# Patient Record
Sex: Female | Born: 1985 | Race: White | Hispanic: No | Marital: Single | Smoking: Current every day smoker
Health system: Southern US, Community
[De-identification: ages and names within clinical notes are randomized; demographics above are authoritative.]

## PROBLEM LIST (undated history)

## (undated) DIAGNOSIS — F419 Anxiety disorder, unspecified: Secondary | ICD-10-CM

## (undated) DIAGNOSIS — M25519 Pain in unspecified shoulder: Secondary | ICD-10-CM

## (undated) DIAGNOSIS — F1910 Other psychoactive substance abuse, uncomplicated: Secondary | ICD-10-CM

## (undated) DIAGNOSIS — B1920 Unspecified viral hepatitis C without hepatic coma: Secondary | ICD-10-CM

## (undated) DIAGNOSIS — F1990 Other psychoactive substance use, unspecified, uncomplicated: Secondary | ICD-10-CM

## (undated) DIAGNOSIS — G8929 Other chronic pain: Secondary | ICD-10-CM

## (undated) DIAGNOSIS — R87629 Unspecified abnormal cytological findings in specimens from vagina: Secondary | ICD-10-CM

## (undated) HISTORY — DX: Unspecified viral hepatitis C without hepatic coma: B19.20

## (undated) HISTORY — PX: ANKLE SURGERY: SHX546

## (undated) NOTE — *Deleted (*Deleted)
Patient discharged via wheelchair accompanied by staff. Pt. Is alert and oriented, not on any distress. AVS discussed and pt. Was properly instructed, pt. Verbalized understanding. Discharge paper given to pt. All personal belongings are with the pt. 

---
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
## 2007-12-26 ENCOUNTER — Emergency Department (HOSPITAL_COMMUNITY): Admission: EM | Admit: 2007-12-26 | Discharge: 2007-12-26 | Payer: Self-pay | Admitting: Emergency Medicine

## 2007-12-26 ENCOUNTER — Encounter: Payer: Self-pay | Admitting: Orthopedic Surgery

## 2008-01-07 ENCOUNTER — Ambulatory Visit: Payer: Self-pay | Admitting: Orthopedic Surgery

## 2008-01-07 DIAGNOSIS — S93409A Sprain of unspecified ligament of unspecified ankle, initial encounter: Secondary | ICD-10-CM | POA: Insufficient documentation

## 2008-06-11 ENCOUNTER — Emergency Department (HOSPITAL_COMMUNITY): Admission: EM | Admit: 2008-06-11 | Discharge: 2008-06-11 | Payer: Self-pay | Admitting: Emergency Medicine

## 2008-09-24 ENCOUNTER — Emergency Department (HOSPITAL_COMMUNITY): Admission: EM | Admit: 2008-09-24 | Discharge: 2008-09-24 | Payer: Self-pay | Admitting: Emergency Medicine

## 2008-10-28 ENCOUNTER — Emergency Department (HOSPITAL_COMMUNITY): Admission: EM | Admit: 2008-10-28 | Discharge: 2008-10-28 | Payer: Self-pay | Admitting: Emergency Medicine

## 2009-04-30 ENCOUNTER — Emergency Department (HOSPITAL_COMMUNITY): Admission: EM | Admit: 2009-04-30 | Discharge: 2009-04-30 | Payer: Self-pay | Admitting: Emergency Medicine

## 2009-08-13 ENCOUNTER — Emergency Department (HOSPITAL_COMMUNITY): Admission: EM | Admit: 2009-08-13 | Discharge: 2009-08-13 | Payer: Self-pay | Admitting: Emergency Medicine

## 2009-10-16 ENCOUNTER — Emergency Department (HOSPITAL_COMMUNITY): Admission: EM | Admit: 2009-10-16 | Discharge: 2009-10-16 | Payer: Self-pay | Admitting: Emergency Medicine

## 2010-01-01 ENCOUNTER — Emergency Department (HOSPITAL_COMMUNITY): Admission: EM | Admit: 2010-01-01 | Discharge: 2010-01-01 | Payer: Self-pay | Admitting: Emergency Medicine

## 2010-03-09 ENCOUNTER — Emergency Department (HOSPITAL_COMMUNITY): Admission: EM | Admit: 2010-03-09 | Discharge: 2010-03-09 | Payer: Self-pay | Admitting: Emergency Medicine

## 2010-03-10 ENCOUNTER — Ambulatory Visit (HOSPITAL_COMMUNITY): Admission: RE | Admit: 2010-03-10 | Discharge: 2010-03-10 | Payer: Self-pay | Admitting: Emergency Medicine

## 2010-05-05 ENCOUNTER — Emergency Department (HOSPITAL_COMMUNITY): Admission: EM | Admit: 2010-05-05 | Discharge: 2010-05-05 | Payer: Self-pay | Admitting: Emergency Medicine

## 2010-05-07 ENCOUNTER — Emergency Department (HOSPITAL_COMMUNITY): Admission: EM | Admit: 2010-05-07 | Discharge: 2010-05-08 | Payer: Self-pay | Admitting: Emergency Medicine

## 2010-05-08 ENCOUNTER — Inpatient Hospital Stay (HOSPITAL_COMMUNITY): Admission: AD | Admit: 2010-05-08 | Discharge: 2010-05-11 | Payer: Self-pay | Admitting: Psychiatry

## 2010-05-08 ENCOUNTER — Ambulatory Visit: Payer: Self-pay | Admitting: Psychiatry

## 2010-05-30 ENCOUNTER — Emergency Department (HOSPITAL_COMMUNITY): Admission: EM | Admit: 2010-05-30 | Discharge: 2010-05-30 | Payer: Self-pay | Admitting: Emergency Medicine

## 2010-05-30 ENCOUNTER — Encounter: Payer: Self-pay | Admitting: Orthopedic Surgery

## 2010-06-14 ENCOUNTER — Emergency Department (HOSPITAL_COMMUNITY): Admission: EM | Admit: 2010-06-14 | Discharge: 2010-06-14 | Payer: Self-pay | Admitting: Emergency Medicine

## 2010-06-14 ENCOUNTER — Encounter: Payer: Self-pay | Admitting: Orthopedic Surgery

## 2010-06-20 ENCOUNTER — Ambulatory Visit: Payer: Self-pay | Admitting: Orthopedic Surgery

## 2010-06-20 DIAGNOSIS — S8253XA Displaced fracture of medial malleolus of unspecified tibia, initial encounter for closed fracture: Secondary | ICD-10-CM

## 2010-06-25 ENCOUNTER — Encounter (INDEPENDENT_AMBULATORY_CARE_PROVIDER_SITE_OTHER): Payer: Self-pay | Admitting: *Deleted

## 2010-06-27 ENCOUNTER — Encounter: Payer: Self-pay | Admitting: Orthopedic Surgery

## 2010-06-29 ENCOUNTER — Ambulatory Visit (HOSPITAL_COMMUNITY): Admission: RE | Admit: 2010-06-29 | Discharge: 2010-06-29 | Payer: Self-pay | Admitting: Orthopedic Surgery

## 2010-06-29 ENCOUNTER — Ambulatory Visit: Payer: Self-pay | Admitting: Orthopedic Surgery

## 2010-07-03 ENCOUNTER — Telehealth: Payer: Self-pay | Admitting: Orthopedic Surgery

## 2010-07-05 ENCOUNTER — Ambulatory Visit: Payer: Self-pay | Admitting: Orthopedic Surgery

## 2010-07-11 ENCOUNTER — Ambulatory Visit: Payer: Self-pay | Admitting: Orthopedic Surgery

## 2010-08-02 ENCOUNTER — Ambulatory Visit: Payer: Self-pay | Admitting: Orthopedic Surgery

## 2010-08-30 ENCOUNTER — Ambulatory Visit: Payer: Self-pay | Admitting: Orthopedic Surgery

## 2011-01-15 NOTE — Assessment & Plan Note (Signed)
Summary: POST OP 1/LT ANKLE FX SURG 06/29/10/[employer]/CAF   Visit Type:  Follow-up Referring Provider:  ER  CC:  post op 1 left ankle.  History of Present Illness: I saw Heidi [name] in the office today for a followup visit.  She is a 25 years old woman with the complaint of:  Postop day #6 minutes to first postop appointment DOS 06/29/10.  POD 6.  Today for dressing change and air cast or cam walker.  Meds: Ibuprofen 800, Norco 5, and Phenergan.  No problems, on crutches today.  Exam shows that the foot as a tibialis anterior spasm bring the foot into significant supination.  Passively I cannot get it straight.  The wound looks fine  I placed her in a short leg cast to try to break the spasm.    Allergies: 1)  ! Effexor 2)  ! Pcn   Impression & Recommendations:  Problem # 1:  AFTERCARE FOLLOW SURGERY MUSCULOSKEL SYSTEM NEC (ICD-V58.78)  Orders: Post-Op Check (16109) Short Leg Cast (60454)  Problem # 2:  CLOSED FRACTURE OF MEDIAL MALLEOLUS (ICD-824.0)  Orders: Post-Op Check (09811) Short Leg Cast (91478)  Patient Instructions: 1)  F/u wednesday  2)  cast  3)  off staples out possible lidocaine injection
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
## 2011-01-15 NOTE — Assessment & Plan Note (Signed)
Summary: post op 2/lt anklebcbs/bsf   Visit Type:  Follow-up Referring Provider:  ER  CC:  post op 2 ankle.  History of Present Illness: I saw Heidi [name] in the office today for a followup visit.  She is a 25 years old woman with the complaint of:  Postop day #6 minutes to first postop appointment  DOS 06/29/10.  POD 12.  Meds: Ibuprofen 800, Norco 5, and Phenergan.  Today is recheck cast off, suture removal, possible injection.  Doing well  The patient lost her medications. She [place] the [place]. She lost her purse. She got her refill early, so we can give her a new one.  Her foot motion is better. Her spasm has improved. She is placed in an Aircast.  X-ray next visit   Allergies: 1)  ! Effexor 2)  ! Pcn   Impression & Recommendations:  Problem # 1:  AFTERCARE FOLLOW SURGERY MUSCULOSKEL SYSTEM NEC (ICD-V58.78) Assessment Improved  Orders: Post-Op Check (81191)  Problem # 2:  CLOSED FRACTURE OF MEDIAL MALLEOLUS (ICD-824.0) Assessment: Improved  Orders: Post-Op Check (47829)  Medications Added to Medication List This Visit: 1)  Ibuprofen 800 Mg Tabs (Ibuprofen) .Marland Kitchen.. 1 q 6 as needed pain  Patient Instructions: 1)  exercise the foot and ankle daily  2)  wear air cast fro ambulation  3)  xrays in 2 weeks  Prescriptions: IBUPROFEN 800 MG TABS (IBUPROFEN) 1 q 6 as needed pain  #90 x 1   Entered and Authorized by:   Fuller Canada MD   Signed by:   Fuller Canada MD on 07/11/2010   Method used:   Print then Give to Patient   RxID:   [id number]

## 2011-01-15 NOTE — Letter (Signed)
Summary: surgery order LT ankle Fx sched 06/29/10  surgery order LT ankle Fx sched 06/29/10   Imported By: Cammie Sickle 06/22/2010 10:54:26  _____________________________________________________________________  External Attachment:    Type:   Image     Comment:   External Document

## 2011-01-15 NOTE — Assessment & Plan Note (Signed)
Summary: 4 xr ankle, brace x 4 weeks/bcbs/bsf   Visit Type:  Follow-up Referring Sanav Remer:  ER  CC:  post op ankle recheck.  History of Present Illness:  DOS 06/29/10.  Meds: Ibuprofen 800, Norco 5 filled this week.  Her ankle motion is improved her foot alignment is returned to normal the spasm in the tibialis anterior tendon has resolved her incision looks good  Today is 4 week recheck after bracing with xrays today.  Doing well, no limp, not in brace.  The patient says she's gotten her life together she has her own apartment and is about to get a job we are very proud of her  Her incision looks great she's and bleeding with no limp  Radiographs AP lateral and oblique of the ankle and question LEFT ankle shows that the fracture is healed the hardware is in good position impression healed medial malleolus fracture with internal fixation 2 screws  Patient is discharged with healed fracture    Allergies: 1)  ! Effexor 2)  ! Pcn   Other Orders: Post-Op Check (16109) Ankle x-ray complete,  minimum 3 views (60454)  Patient Instructions: 1)  Please schedule a follow-up appointment as needed.

## 2011-01-15 NOTE — Letter (Signed)
Summary: History form  History form   Imported By: Jacklynn Ganong 06/25/2010 13:31:23  _____________________________________________________________________  External Attachment:    Type:   Image     Comment:   External Document

## 2011-01-15 NOTE — Progress Notes (Signed)
Summary: patient re-scheduled post op appointment  Phone Note Call from Patient   Caller: Patient Summary of Call: Patient called to re-schedule her post op #1 appointment for this afternoon, due to no transportation. Re-scheduled for Thursday, 07/05/10.  Gave patient option of Wednesday also; patient prefersThursday as certain of ride then. Initial call taken by: Cammie Sickle,  July 03, 2010 3:16 PM

## 2011-01-15 NOTE — Assessment & Plan Note (Signed)
Summary: RE-CHECK/XRAY/POST OP 06/29/10/[employer]/CAF   Visit Type:  Follow-up Referring Provider:  ER  CC:  post op left ankle.  History of Present Illness:  DOS 06/29/10.  Meds: Ibuprofen 800, Norco 7.5 q 4 hrs.  The patient continues to improve she is here today for x-rays and prescription refill  Her ankle motion is improved her foot alignment is returned to normal the spasm in the tibialis anterior tendon has resolved her incision looks good  X-rays were obtained of the LEFT ankle 3 views fracture appears to be healing well with hardware intact mortise stable  Impression healing LEFT ankle fracture medial malleolus fixation  plan  brace x4 weeks x-rays 4 weeks  Allergies: 1)  ! Effexor 2)  ! Pcn   Impression & Recommendations:  Problem # 1:  AFTERCARE FOLLOW SURGERY MUSCULOSKEL SYSTEM NEC (ICD-V58.78) Assessment Improved  Orders: Post-Op Check (16109)  Problem # 2:  CLOSED FRACTURE OF MEDIAL MALLEOLUS (ICD-824.0) Assessment: Improved  Orders: Post-Op Check (60454) Ankle x-ray complete,  minimum 3 views (09811)  Medications Added to Medication List This Visit: 1)  Norco 7.5-325 Mg Tabs (Hydrocodone-acetaminophen) .Marland Kitchen.. 1 q 4 as needed pain  Patient Instructions: 1)  4 weeks xrays ankle  2)  brace x 4 weeks  Prescriptions: NORCO 7.5-325 MG TABS (HYDROCODONE-ACETAMINOPHEN) 1 q 4 as needed pain  #84 x 1   Entered and Authorized by:   Fuller Canada MD   Signed by:   Fuller Canada MD on 08/02/2010   Method used:   Print then Give to Patient   RxID:   [id number]
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
## 2011-01-15 NOTE — Medication Information (Signed)
Summary: RX Medication discharge instructions [facility]  RX Medication discharge instructions [facility]   Imported By: Cammie Sickle 07/03/2010 13:46:49  _____________________________________________________________________  External Attachment:    Type:   Image     Comment:   External Document

## 2011-01-15 NOTE — Miscellaneous (Signed)
Summary: No pre-auth required for out-patient surgery  Clinical Lists Changes   Contacted [employer] insurance re: pre-authorization for out-patient surgery scheduled 06/29/10. Per Deon, if in network, no pre-cert required. CPT [phone] range do not require.

## 2011-03-03 LAB — CBC
HCT: 39.7 % (ref 36.0–46.0)
Hemoglobin: 13.2 g/dL (ref 12.0–15.0)
MCH: 28.2 pg (ref 26.0–34.0)
MCHC: 33.3 g/dL (ref 30.0–36.0)
MCV: 84.6 fL (ref 78.0–100.0)
Platelets: 360 10*3/uL (ref 150–400)
RBC: 4.69 MIL/uL (ref 3.87–5.11)
RDW: 13.7 % (ref 11.5–15.5)
WBC: 12 10*3/uL — ABNORMAL HIGH (ref 4.0–10.5)

## 2011-03-03 LAB — URINE MICROSCOPIC-ADD ON

## 2011-03-03 LAB — URINALYSIS, ROUTINE W REFLEX MICROSCOPIC
Glucose, UA: NEGATIVE mg/dL
Ketones, ur: NEGATIVE mg/dL
Ketones, ur: NEGATIVE mg/dL
Leukocytes, UA: NEGATIVE
Nitrite: NEGATIVE
Specific Gravity, Urine: 1.03 (ref 1.005–1.030)
pH: 6 (ref 5.0–8.0)

## 2011-03-03 LAB — DIFFERENTIAL
Eosinophils Relative: 1 % (ref 0–5)
Lymphocytes Relative: 20 % (ref 12–46)
Lymphs Abs: 2.4 10*3/uL (ref 0.7–4.0)
Monocytes Absolute: 0.5 10*3/uL (ref 0.1–1.0)
Monocytes Relative: 5 % (ref 3–12)
Neutro Abs: 8.9 10*3/uL — ABNORMAL HIGH (ref 1.7–7.7)

## 2011-03-03 LAB — RAPID URINE DRUG SCREEN, HOSP PERFORMED
Amphetamines: NOT DETECTED
Barbiturates: NOT DETECTED
Cocaine: NOT DETECTED
Opiates: POSITIVE — AB
Tetrahydrocannabinol: POSITIVE — AB
Tetrahydrocannabinol: POSITIVE — AB

## 2011-03-03 LAB — PREGNANCY, URINE: Preg Test, Ur: NEGATIVE

## 2011-03-03 LAB — BASIC METABOLIC PANEL
Calcium: 9.4 mg/dL (ref 8.4–10.5)
Sodium: 138 mEq/L (ref 135–145)

## 2011-03-03 LAB — ETHANOL: Alcohol, Ethyl (B): <5 mg/dL (ref 0–10)

## 2011-03-04 LAB — BASIC METABOLIC PANEL
CO2: 25 mEq/L (ref 19–32)
Calcium: 8.6 mg/dL (ref 8.4–10.5)
Chloride: 105 mEq/L (ref 96–112)
Creatinine, Ser: 0.72 mg/dL (ref 0.4–1.2)
GFR calc Af Amer: >60 mL/min (ref >60)
Glucose, Bld: 121 mg/dL — ABNORMAL HIGH (ref 70–99)

## 2011-03-04 LAB — URINALYSIS, ROUTINE W REFLEX MICROSCOPIC
Glucose, UA: NEGATIVE mg/dL
Hgb urine dipstick: NEGATIVE
Protein, ur: NEGATIVE mg/dL
Specific Gravity, Urine: >1.03 — ABNORMAL HIGH (ref 1.005–1.030)
pH: 5 (ref 5.0–8.0)

## 2011-03-04 LAB — RAPID URINE DRUG SCREEN, HOSP PERFORMED
Amphetamines: NOT DETECTED
Barbiturates: NOT DETECTED
Benzodiazepines: POSITIVE — AB
Cocaine: NOT DETECTED
Opiates: POSITIVE — AB

## 2011-03-04 LAB — DIFFERENTIAL
Basophils Absolute: 0 10*3/uL (ref 0.0–0.1)
Basophils Relative: 1 % (ref 0–1)
Eosinophils Absolute: 0.1 10*3/uL (ref 0.0–0.7)
Monocytes Absolute: 0.4 10*3/uL (ref 0.1–1.0)
Monocytes Relative: 6 % (ref 3–12)
Neutrophils Relative %: 57 % (ref 43–77)

## 2011-03-04 LAB — CBC
Hemoglobin: 11.3 g/dL — ABNORMAL LOW (ref 12.0–15.0)
MCHC: 34.8 g/dL (ref 30.0–36.0)
MCV: 82 fL (ref 78.0–100.0)
RBC: 3.97 MIL/uL (ref 3.87–5.11)
RDW: 13.4 % (ref 11.5–15.5)

## 2011-03-10 LAB — WET PREP, GENITAL: Trich, Wet Prep: NONE SEEN

## 2011-03-10 LAB — URINALYSIS, ROUTINE W REFLEX MICROSCOPIC
Glucose, UA: NEGATIVE mg/dL
Hgb urine dipstick: NEGATIVE
Ketones, ur: NEGATIVE mg/dL
Protein, ur: NEGATIVE mg/dL
Urobilinogen, UA: 0.2 mg/dL (ref 0.0–1.0)

## 2011-03-20 LAB — URINALYSIS, ROUTINE W REFLEX MICROSCOPIC
Leukocytes, UA: NEGATIVE
Nitrite: POSITIVE — AB
Protein, ur: NEGATIVE mg/dL
Urobilinogen, UA: 1 mg/dL (ref 0.0–1.0)

## 2011-03-20 LAB — URINE CULTURE

## 2011-03-20 LAB — PREGNANCY, URINE: Preg Test, Ur: NEGATIVE

## 2011-03-20 LAB — URINE MICROSCOPIC-ADD ON

## 2011-03-26 LAB — URINE CULTURE

## 2011-03-26 LAB — URINALYSIS, ROUTINE W REFLEX MICROSCOPIC
Glucose, UA: NEGATIVE mg/dL
Ketones, ur: NEGATIVE mg/dL
Leukocytes, UA: NEGATIVE
Protein, ur: NEGATIVE mg/dL

## 2011-03-26 LAB — PREGNANCY, URINE: Preg Test, Ur: NEGATIVE

## 2011-03-26 LAB — RAPID URINE DRUG SCREEN, HOSP PERFORMED
Amphetamines: NOT DETECTED
Barbiturates: NOT DETECTED
Benzodiazepines: POSITIVE — AB

## 2011-09-17 LAB — URINALYSIS, ROUTINE W REFLEX MICROSCOPIC
Leukocytes, UA: NEGATIVE
Nitrite: NEGATIVE
Specific Gravity, Urine: <1.005 — ABNORMAL LOW
pH: 6

## 2011-09-17 LAB — URINE MICROSCOPIC-ADD ON

## 2011-09-17 LAB — PREGNANCY, URINE: Preg Test, Ur: NEGATIVE

## 2011-10-09 ENCOUNTER — Emergency Department (HOSPITAL_COMMUNITY)
Admission: EM | Admit: 2011-10-09 | Discharge: 2011-10-09 | Disposition: A | Discharge status: Home / Self Care | Payer: Medicaid Other | Attending: Emergency Medicine | Admitting: Emergency Medicine

## 2011-10-09 ENCOUNTER — Encounter: Payer: Self-pay | Admitting: Emergency Medicine

## 2011-10-09 DIAGNOSIS — F17200 Nicotine dependence, unspecified, uncomplicated: Secondary | ICD-10-CM | POA: Insufficient documentation

## 2011-10-09 DIAGNOSIS — M25511 Pain in right shoulder: Secondary | ICD-10-CM

## 2011-10-09 DIAGNOSIS — M25519 Pain in unspecified shoulder: Secondary | ICD-10-CM | POA: Insufficient documentation

## 2011-10-09 HISTORY — DX: Pain in unspecified shoulder: M25.519

## 2011-10-09 HISTORY — DX: Other chronic pain: G89.29

## 2011-10-09 MED ORDER — HYDROCODONE-ACETAMINOPHEN 5-325 MG PO TABS: 1.0000 | ORAL_TABLET | Freq: Four times a day (QID) | ORAL | Status: AC | PRN

## 2011-10-09 MED ORDER — HYDROCODONE-ACETAMINOPHEN 5-325 MG PO TABS
1.0000 | ORAL_TABLET | Freq: Once | ORAL | Status: AC
  Administered 2011-10-09: 1 via ORAL
  Filled 2011-10-09: qty 1

## 2011-10-09 NOTE — ED Provider Notes (Signed)
Medical screening examination/treatment/procedure(s) were performed by non-physician practitioner and as supervising physician I was immediately available for consultation/collaboration.  Fitzroy Mikami R. Gerianne Simonet, MD 10/09/11 2359 

## 2011-10-09 NOTE — ED Provider Notes (Signed)
History     CSN: [id number] Arrival date & time: 10/09/2011  1:24 PM   First MD Initiated Contact with Patient 10/09/11 1520      Chief Complaint  Patient presents with  . Shoulder Pain    (Consider location/radiation/quality/duration/timing/severity/associated sxs/prior treatment) HPI Comments: States she injured her R shoulder while going through labor 6 weeks ago.  Recently seen at [facility] and told the x-rays were normal.  She wants pain medicine and an orthopedic referral.  Patient is a 25 y.o. female presenting with shoulder pain. The history is provided by the patient. No language interpreter was used.  Shoulder Pain This is a new problem. Episode onset: 6 weeks ago. The problem occurs constantly. The problem has been unchanged. Associated symptoms include arthralgias. Exacerbated by: movement. Treatments tried: soma and tramadol. The treatment provided no relief.    Past Medical History  Diagnosis Date  . Chronic shoulder pain     Past Surgical History  Procedure Date  . Ankle surgery     No family history on file.  History  Substance Use Topics  . Smoking status: Current Everyday Smoker  . Smokeless tobacco: Not on file  . Alcohol Use: No    OB History    Grav Para Term Preterm Abortions TAB SAB Ect Mult Living                  Review of Systems  Musculoskeletal: Positive for arthralgias.  All other systems reviewed and are negative.    Allergies  Penicillins; Venlafaxine; and Morphine and related  Home Medications   Current Outpatient Rx  Name Route Sig Dispense Refill  . IRON 240 (27 FE) MG PO TABS Oral Take 1 tablet by mouth daily. OTC     . IBUPROFEN 200 MG PO TABS Oral Take 800 mg by mouth every 6 (six) hours as needed. FOR PAIN OTC       BP 114/66  Pulse 85  Temp(Src) 98.6 F (37 C) (Oral)  Resp 20  Ht 5\' 7"  (1.702 m)  Wt 145 lb (65.772 kg)  BMI 22.71 kg/m2  SpO2 100%  LMP 10/02/2011  Physical Exam  Nursing note and  vitals reviewed. Constitutional: She is oriented to person, place, and time. She appears well-developed and well-nourished. No distress.  HENT:  Head: Normocephalic and atraumatic.  Eyes: EOM are normal.  Neck: Normal range of motion.  Cardiovascular: Normal rate, regular rhythm and normal heart sounds.   Pulmonary/Chest: Effort normal and breath sounds normal.  Abdominal: Soft. She exhibits no distension. There is no tenderness.  Musculoskeletal: She exhibits tenderness.       Right shoulder: She exhibits decreased range of motion and tenderness. She exhibits no bony tenderness, no swelling, no effusion and no crepitus.       Arms: Neurological: She is alert and oriented to person, place, and time.  Skin: Skin is warm and dry.  Psychiatric: She has a normal mood and affect. Judgment normal.    ED Course  Procedures (including critical care time)  Labs Reviewed - No data to display No results found.   No diagnosis found.    MDM          Worthy Rancher, PA 10/09/11 1534

## 2011-10-09 NOTE — ED Notes (Signed)
Pt has chronic r shoulder pain from high school. Moved furniture x 1 week ago and has been having pain since. ROM slightly limited due to pain. Nad. Ibuprofen with no relief. Seen at [facility] ER the other night and given muscle relaxer which pt states does not like to have due to newborn baby at home.

## 2012-03-31 ENCOUNTER — Encounter (HOSPITAL_COMMUNITY): Payer: Self-pay

## 2012-03-31 ENCOUNTER — Emergency Department (HOSPITAL_COMMUNITY)
Admission: EM | Admit: 2012-03-31 | Discharge: 2012-03-31 | Disposition: A | Discharge status: Home / Self Care | Payer: Self-pay | Attending: Emergency Medicine | Admitting: Emergency Medicine

## 2012-03-31 DIAGNOSIS — M25511 Pain in right shoulder: Secondary | ICD-10-CM

## 2012-03-31 DIAGNOSIS — F17200 Nicotine dependence, unspecified, uncomplicated: Secondary | ICD-10-CM | POA: Insufficient documentation

## 2012-03-31 DIAGNOSIS — G8929 Other chronic pain: Secondary | ICD-10-CM | POA: Insufficient documentation

## 2012-03-31 DIAGNOSIS — M25519 Pain in unspecified shoulder: Secondary | ICD-10-CM | POA: Insufficient documentation

## 2012-03-31 MED ORDER — PREDNISONE 10 MG PO TABS: ORAL_TABLET | ORAL | Status: DC

## 2012-03-31 MED ORDER — TRAMADOL HCL 50 MG PO TABS: 50.0000 mg | ORAL_TABLET | Freq: Four times a day (QID) | ORAL | Status: AC | PRN

## 2012-03-31 NOTE — Discharge Instructions (Signed)
Arthralgia Your caregiver has diagnosed you as suffering from an arthralgia. Arthralgia means there is pain in a joint. This can come from many reasons including:  Bruising the joint which causes soreness (inflammation) in the joint.   Wear and tear on the joints which occur as we grow older (osteoarthritis).   Overusing the joint.   Various forms of arthritis.   Infections of the joint.  Regardless of the cause of pain in your joint, most of these different pains respond to anti-inflammatory drugs and rest. The exception to this is when a joint is infected, and these cases are treated with antibiotics, if it is a bacterial infection. HOME CARE INSTRUCTIONS   Rest the injured area for as long as directed by your caregiver. Then slowly start using the joint as directed by your caregiver and as the pain allows. Crutches as directed may be useful if the ankles, knees or hips are involved. If the knee was splinted or casted, continue use and care as directed. If an stretchy or elastic wrapping bandage has been applied today, it should be removed and re-applied every 3 to 4 hours. It should not be applied tightly, but firmly enough to keep swelling down. Watch toes and feet for swelling, bluish discoloration, coldness, numbness or excessive pain. If any of these problems (symptoms) occur, remove the ace bandage and re-apply more loosely. If these symptoms persist, contact your caregiver or return to this location.   For the first 24 hours, keep the injured extremity elevated on pillows while lying down.   Apply ice for 15 to 20 minutes to the sore joint every couple hours while awake for the first half day. Then 3 to 4 times per day for the first 48 hours. Put the ice in a plastic bag and place a towel between the bag of ice and your skin.   Wear any splinting, casting, elastic bandage applications, or slings as instructed.   Only take over-the-counter or prescription medicines for pain,  discomfort, or fever as directed by your caregiver. Do not use aspirin immediately after the injury unless instructed by your physician. Aspirin can cause increased bleeding and bruising of the tissues.   If you were given crutches, continue to use them as instructed and do not resume weight bearing on the sore joint until instructed.  Persistent pain and inability to use the sore joint as directed for more than 2 to 3 days are warning signs indicating that you should see a caregiver for a follow-up visit as soon as possible. Initially, a hairline fracture (break in bone) may not be evident on X-rays. Persistent pain and swelling indicate that further evaluation, non-weight bearing or use of the joint (use of crutches or slings as instructed), or further X-rays are indicated. X-rays may sometimes not show a small fracture until a week or 10 days later. Make a follow-up appointment with your own caregiver or one to whom we have referred you. A radiologist (specialist in reading X-rays) may read your X-rays. Make sure you know how you are to obtain your X-ray results. Do not assume everything is normal if you do not hear from us. SEEK MEDICAL CARE IF: Bruising, swelling, or pain increases. SEEK IMMEDIATE MEDICAL CARE IF:   Your fingers or toes are numb or blue.   The pain is not responding to medications and continues to stay the same or get worse.   The pain in your joint becomes severe.   You develop a fever over   102 F (38.9 C).   It becomes impossible to move or use the joint.  MAKE SURE YOU:   Understand these instructions.   Will watch your condition.   Will get help right away if you are not doing well or get worse.  Document Released: 12/02/2005 Document Revised: 11/21/2011 Document Reviewed: 07/20/2008 [employer] Patient Information 2012 [employer]. 

## 2012-03-31 NOTE — ED Notes (Signed)
C/o rt shoulder pain x 6 months. Seen 2 months ago in ED and referred to Dr. Hilda Lias who refused to see her per pt. Pt states, " shoulder has not gotten any better"

## 2012-03-31 NOTE — ED Notes (Signed)
Pain rt shoulder for 6-7 mos. Seen here in 01/2012 and referred to Dr Hilda Lias.  Wants referral to another doctor and med for pain. Does not know of any injury but has hurt since child birth.

## 2012-03-31 NOTE — ED Provider Notes (Signed)
History     CSN: [id number]  Arrival date & time 03/31/12  1034   First MD Initiated Contact with Patient 03/31/12 1145      Chief Complaint  Patient presents with  . Shoulder Pain    (Consider location/radiation/quality/duration/timing/severity/associated sxs/prior treatment) HPI Comments: Heidi [name] presents with chronic right shoulder pain which she states she injured while delivering her now 57-month-old infant.  She was seen for this injury here last October and was referred to Dr. Hilda Lias, but she has been unable to see him.  She has also since been seen at an outside emergency department where she said x-rays were normal, but was told she probably had a rotator cuff injury.  She denies any new injury, but is requesting pain medication and a new referral to orthopedics.  She has seen Dr. Romeo Apple in the past when she fractured her ankle 2 years ago, but has not thought about calling him about her shoulder.  She has taken soma and ibuprofen in the past which has not relieved her symptoms.   Patient is a 26 y.o. female presenting with shoulder pain. The history is provided by the patient.  Shoulder Pain This is a chronic problem. The current episode started more than 1 month ago. The problem occurs constantly. The problem has been unchanged. Associated symptoms include arthralgias. Pertinent negatives include no abdominal pain, chest pain, congestion, coughing, fever, headaches, joint swelling, nausea, neck pain, numbness, rash, sore throat or weakness. Exacerbated by: Range of motion, particularly elevation above shoulder level exquisitely increases pain. She has tried NSAIDs for the symptoms. The treatment provided no relief.    Past Medical History  Diagnosis Date  . Chronic shoulder pain     Past Surgical History  Procedure Date  . Ankle surgery     No family history on file.  History  Substance Use Topics  . Smoking status: Current Everyday Smoker  . Smokeless tobacco:  Not on file  . Alcohol Use: No    OB History    Grav Para Term Preterm Abortions TAB SAB Ect Mult Living                  Review of Systems  Constitutional: Negative for fever.  HENT: Negative for congestion, sore throat and neck pain.   Eyes: Negative.   Respiratory: Negative for cough, chest tightness and shortness of breath.   Cardiovascular: Negative for chest pain.  Gastrointestinal: Negative for nausea and abdominal pain.  Genitourinary: Negative.   Musculoskeletal: Positive for arthralgias. Negative for joint swelling.  Skin: Negative.  Negative for rash and wound.  Neurological: Negative for dizziness, weakness, light-headedness, numbness and headaches.  Hematological: Negative.   Psychiatric/Behavioral: Negative.     Allergies  Penicillins; Venlafaxine; and Morphine and related  Home Medications   Current Outpatient Rx  Name Route Sig Dispense Refill  . IRON 240 (27 FE) MG PO TABS Oral Take 1 tablet by mouth daily. OTC     . IBUPROFEN 200 MG PO TABS Oral Take 800 mg by mouth every 6 (six) hours as needed. FOR PAIN OTC     . PREDNISONE 10 MG PO TABS  6, 5, 4, 3, 2 then 1 tablet by mouth daily for 6 days total. 21 tablet 0  . TRAMADOL HCL 50 MG PO TABS Oral Take 1 tablet (50 mg total) by mouth every 6 (six) hours as needed for pain. 15 tablet 0    BP 117/66  Pulse 71  Temp(Src) 98.1 F (  36.7 C) (Oral)  Resp 16  Ht 5\' 7"  (1.702 m)  Wt 136 lb (61.689 kg)  BMI 21.30 kg/m2  SpO2 100%  LMP 03/02/2012  Physical Exam  Nursing note and vitals reviewed. Constitutional: She is oriented to person, place, and time. She appears well-developed and well-nourished.  HENT:  Head: Normocephalic and atraumatic.  Eyes: Conjunctivae are normal.  Neck: Normal range of motion.  Cardiovascular: Normal rate, regular rhythm, normal heart sounds and intact distal pulses.  Exam reveals no decreased pulses.   Pulses:      Radial pulses are 2+ on the right side.  Pulmonary/Chest:  Effort normal and breath sounds normal. She has no wheezes.  Abdominal: Soft. Bowel sounds are normal. There is no tenderness.  Musculoskeletal: Normal range of motion. She exhibits tenderness. She exhibits no edema.       Right shoulder: She exhibits bony tenderness and pain. She exhibits no swelling, no effusion, no crepitus, no deformity, no spasm, normal pulse and normal strength.       Arms: Neurological: She is alert and oriented to person, place, and time. She has normal strength. No sensory deficit.       Equal grip strength  Skin: Skin is warm, dry and intact.  Psychiatric: She has a normal mood and affect.    ED Course  Procedures (including critical care time)  Labs Reviewed - No data to display No results found.   1. Chronic right shoulder pain       MDM  Patient presents with chronic pain, no acute injury and no significant findings on exam including erythema or effusion, crepitus of shoulder joint.  No signs of infectious source of pain.  She has equal grip strength bilaterally and no decreased sensation to fine touch or numbness distally to suggest neuropathy or C-spine injury.  Patient was prescribed a six-day prednisone taper and tramadol encouraged to call Dr. Romeo Apple for further management of her pain.        Candis Musa, PA 03/31/12 1824

## 2012-04-01 NOTE — ED Provider Notes (Signed)
Medical screening examination/treatment/procedure(s) were performed by non-physician practitioner and as supervising physician I was immediately available for consultation/collaboration.   Seini Lannom L Bao Bazen, MD 04/01/12 0715 

## 2012-12-31 ENCOUNTER — Emergency Department (HOSPITAL_COMMUNITY)
Admission: EM | Admit: 2012-12-31 | Discharge: 2012-12-31 | Disposition: A | Discharge status: Home / Self Care | Payer: Medicaid Other | Attending: Emergency Medicine | Admitting: Emergency Medicine

## 2012-12-31 ENCOUNTER — Encounter (HOSPITAL_COMMUNITY): Payer: Self-pay | Admitting: *Deleted

## 2012-12-31 DIAGNOSIS — F17200 Nicotine dependence, unspecified, uncomplicated: Secondary | ICD-10-CM | POA: Insufficient documentation

## 2012-12-31 DIAGNOSIS — M545 Low back pain, unspecified: Secondary | ICD-10-CM | POA: Insufficient documentation

## 2012-12-31 DIAGNOSIS — M25519 Pain in unspecified shoulder: Secondary | ICD-10-CM | POA: Insufficient documentation

## 2012-12-31 DIAGNOSIS — G8929 Other chronic pain: Secondary | ICD-10-CM | POA: Insufficient documentation

## 2012-12-31 DIAGNOSIS — M549 Dorsalgia, unspecified: Secondary | ICD-10-CM

## 2012-12-31 MED ORDER — HYDROCODONE-ACETAMINOPHEN 5-325 MG PO TABS
2.0000 | ORAL_TABLET | Freq: Once | ORAL | Status: AC
  Administered 2012-12-31: 2 via ORAL
  Filled 2012-12-31: qty 2

## 2012-12-31 MED ORDER — HYDROCODONE-ACETAMINOPHEN 5-325 MG PO TABS: 2.0000 | ORAL_TABLET | ORAL | Status: DC | PRN

## 2012-12-31 NOTE — ED Notes (Signed)
Pt c/o lower left back pain since she gave birth in December.  Denies any N/V/D.  Denies any pain with urination.

## 2012-12-31 NOTE — ED Provider Notes (Signed)
History   Scribed for Geoffery Lyons, MD, the patient was seen in room [facility]18/[facility]18 . This chart was scribed by Lewanda Rife.   CSN: [id number]  Arrival date & time 12/31/12  1953   First MD Initiated Contact with Patient 12/31/12 2216      Chief Complaint  Patient presents with  . Back Pain    (Consider location/radiation/quality/duration/timing/severity/associated sxs/prior treatment) HPI Heidi [name] is a 27 y.o. female who presents to the Emergency Department complaining of waxing and waning moderate left lower back pain since after giving birth 3 weeks ago. Pt denies having any urinary symptoms, constipation, and diarrhea. Pt denies taking anything for pain to treat symptoms. Pt reports hx of back pain in the past.   Past Medical History  Diagnosis Date  . Chronic shoulder pain     Past Surgical History  Procedure Date  . Ankle surgery     History reviewed. No pertinent family history.  History  Substance Use Topics  . Smoking status: Current Every Day Smoker  . Smokeless tobacco: Not on file  . Alcohol Use: No    OB History    Grav Para Term Preterm Abortions TAB SAB Ect Mult Living                  Review of Systems  Constitutional: Negative.  Negative for fever.  HENT: Negative.   Respiratory: Negative.   Cardiovascular: Negative.   Gastrointestinal: Negative.   Musculoskeletal: Positive for back pain.  Skin: Negative.   Neurological: Negative.   Hematological: Negative.   Psychiatric/Behavioral: Negative.   All other systems reviewed and are negative.    Allergies  Penicillins; Venlafaxine; and Morphine and related  Home Medications   Current Outpatient Rx  Name  Route  Sig  Dispense  Refill  . CLONAZEPAM 1 MG PO TABS   Oral   Take 1 mg by mouth 3 (three) times daily as needed. For anxiety         . IBUPROFEN 200 MG PO TABS   Oral   Take 800 mg by mouth every 6 (six) hours as needed. FOR PAIN OTC            BP 120/88  Pulse  99  Temp 98 F (36.7 C) (Oral)  Resp 18  Ht 5\' 7"  (1.702 m)  Wt 137 lb (62.143 kg)  BMI 21.46 kg/m2  SpO2 98%  LMP 12/05/2012  Physical Exam  Nursing note and vitals reviewed. Constitutional: She is oriented to person, place, and time. She appears well-developed and well-nourished.  HENT:  Head: Normocephalic and atraumatic.  Eyes: Conjunctivae normal are normal. Pupils are equal, round, and reactive to light.  Neck: Normal range of motion. Neck supple. No tracheal deviation present. No thyromegaly present.  Cardiovascular: Normal rate and regular rhythm.   No murmur heard. Pulmonary/Chest: Effort normal and breath sounds normal.  Abdominal: Soft. Bowel sounds are normal. She exhibits no distension. There is no tenderness.  Musculoskeletal: Normal range of motion. She exhibits no edema and no tenderness.       Tenderness to palpation to soft tissues of lower lumbar region. DTR are 3+ and equal/bilateral in lower extremities. Strength is 5/5 in bilateral and lower extremities. Able to walk on toes without difficulty   Neurological: She is alert and oriented to person, place, and time. Coordination normal.  Skin: Skin is warm and dry. No rash noted.  Psychiatric: She has a normal mood and affect.    ED Course  Procedures (including critical care time)  Labs Reviewed - No data to display No results found.   No diagnosis found.    MDM  There is no evidence for an emergent cause of this.  There is no weakness, bowel or bladder incontinence, and the reflexes are symmetrical.  She will be discharged with nsaids, pain meds.  Return prn.      I personally performed the services described in this documentation, which was scribed in my presence. The recorded information has been reviewed and is accurate.      Geoffery Lyons, MD 01/01/13 0000

## 2012-12-31 NOTE — ED Notes (Signed)
Pt with left lower back pain since she had her daughter on 12/05/12

## 2013-04-19 ENCOUNTER — Encounter (HOSPITAL_COMMUNITY): Payer: Self-pay | Admitting: *Deleted

## 2013-04-19 ENCOUNTER — Emergency Department (HOSPITAL_COMMUNITY)
Admission: EM | Admit: 2013-04-19 | Discharge: 2013-04-19 | Disposition: A | Discharge status: Home / Self Care | Payer: Medicaid Other | Attending: Emergency Medicine | Admitting: Emergency Medicine

## 2013-04-19 DIAGNOSIS — R220 Localized swelling, mass and lump, head: Secondary | ICD-10-CM | POA: Insufficient documentation

## 2013-04-19 DIAGNOSIS — H9209 Otalgia, unspecified ear: Secondary | ICD-10-CM | POA: Insufficient documentation

## 2013-04-19 DIAGNOSIS — Z79899 Other long term (current) drug therapy: Secondary | ICD-10-CM | POA: Insufficient documentation

## 2013-04-19 DIAGNOSIS — M25519 Pain in unspecified shoulder: Secondary | ICD-10-CM | POA: Insufficient documentation

## 2013-04-19 DIAGNOSIS — K089 Disorder of teeth and supporting structures, unspecified: Secondary | ICD-10-CM | POA: Insufficient documentation

## 2013-04-19 DIAGNOSIS — F411 Generalized anxiety disorder: Secondary | ICD-10-CM | POA: Insufficient documentation

## 2013-04-19 DIAGNOSIS — F17200 Nicotine dependence, unspecified, uncomplicated: Secondary | ICD-10-CM | POA: Insufficient documentation

## 2013-04-19 DIAGNOSIS — G8929 Other chronic pain: Secondary | ICD-10-CM | POA: Insufficient documentation

## 2013-04-19 DIAGNOSIS — K0889 Other specified disorders of teeth and supporting structures: Secondary | ICD-10-CM

## 2013-04-19 DIAGNOSIS — Z880 Allergy status to penicillin: Secondary | ICD-10-CM | POA: Insufficient documentation

## 2013-04-19 DIAGNOSIS — K029 Dental caries, unspecified: Secondary | ICD-10-CM | POA: Insufficient documentation

## 2013-04-19 DIAGNOSIS — K1370 Unspecified lesions of oral mucosa: Secondary | ICD-10-CM | POA: Insufficient documentation

## 2013-04-19 HISTORY — DX: Anxiety disorder, unspecified: F41.9

## 2013-04-19 MED ORDER — CLINDAMYCIN HCL 150 MG PO CAPS
300.0000 mg | ORAL_CAPSULE | Freq: Once | ORAL | Status: AC
  Administered 2013-04-19: 300 mg via ORAL
  Filled 2013-04-19: qty 2

## 2013-04-19 MED ORDER — CLINDAMYCIN HCL 150 MG PO CAPS: 300.0000 mg | ORAL_CAPSULE | Freq: Four times a day (QID) | ORAL | Status: DC

## 2013-04-19 MED ORDER — ACETAMINOPHEN-CODEINE #3 300-30 MG PO TABS: 1.0000 | ORAL_TABLET | Freq: Four times a day (QID) | ORAL | Status: DC | PRN

## 2013-04-19 MED ORDER — HYDROCODONE-ACETAMINOPHEN 5-325 MG PO TABS
1.0000 | ORAL_TABLET | Freq: Once | ORAL | Status: AC
  Administered 2013-04-19: 1 via ORAL
  Filled 2013-04-19: qty 1

## 2013-04-19 MED ORDER — HYDROCODONE-ACETAMINOPHEN 5-325 MG PO TABS: ORAL_TABLET | ORAL | Status: DC

## 2013-04-19 NOTE — ED Provider Notes (Signed)
History     CSN: [id number]  Arrival date & time 04/19/13  2114   First MD Initiated Contact with Patient 04/19/13 2211      Chief Complaint  Patient presents with  . Dental Pain    (Consider location/radiation/quality/duration/timing/severity/associated sxs/prior treatment) Patient is a 27 y.o. Heidi [name] presenting with tooth pain. The history is provided by the patient.  Dental PainThe primary symptoms include mouth pain. Primary symptoms do not include dental injury, oral bleeding, oral lesions, headaches, fever, shortness of breath, sore throat, angioedema or cough. The symptoms began 2 days ago. The symptoms are worsening. The symptoms are new.  Mouth pain occurs constantly. Mouth pain is worsening. Affected locations include: teeth.  Additional symptoms include: dental sensitivity to temperature, gum tenderness, facial swelling and ear pain. Additional symptoms do not include: gum swelling, trismus, trouble swallowing, pain with swallowing, drooling and swollen glands. Medical issues include: smoking and periodontal disease.    Past Medical History  Diagnosis Date  . Chronic shoulder pain   . Anxiety     Past Surgical History  Procedure Laterality Date  . Ankle surgery      History reviewed. No pertinent family history.  History  Substance Use Topics  . Smoking status: Current Every Day Smoker  . Smokeless tobacco: Not on file  . Alcohol Use: No    OB History   Grav Para Term Preterm Abortions TAB SAB Ect Mult Living                  Review of Systems  Constitutional: Negative for fever and appetite change.  HENT: Positive for ear pain, facial swelling and dental problem. Negative for congestion, sore throat, drooling, trouble swallowing, neck pain and neck stiffness.   Eyes: Negative for pain and visual disturbance.  Respiratory: Negative for cough and shortness of breath.   Neurological: Negative for dizziness, facial asymmetry and headaches.  Hematological:  Negative for adenopathy.  All other systems reviewed and are negative.    Allergies  Penicillins; Venlafaxine; and Morphine and related  Home Medications   Current Outpatient Rx  Name  Route  Sig  Dispense  Refill  . clonazePAM (KLONOPIN) 1 MG tablet   Oral   Take 1 mg by mouth 3 (three) times daily as needed. For anxiety         . HYDROcodone-acetaminophen (NORCO/VICODIN) 5-325 MG per tablet   Oral   Take 2 tablets by mouth every 4 (four) hours as needed for pain.   15 tablet   0   . ibuprofen (ADVIL,MOTRIN) 200 MG tablet   Oral   Take 800 mg by mouth every 6 (six) hours as needed. FOR PAIN OTC            BP 104/Heidi  Pulse 85  Temp(Src) 97.7 F (36.5 C) (Oral)  Resp 24  Ht 5\' 7"  (1.702 m)  Wt 128 lb (58.06 kg)  BMI 20.04 kg/m2  SpO2 97%  LMP 04/19/2013  Physical Exam  Nursing note and vitals reviewed. Constitutional: She is oriented to person, place, and time. She appears well-developed and well-nourished. No distress.  HENT:  Head: Normocephalic and atraumatic. No trismus in the jaw.  Right Ear: Tympanic membrane and ear canal normal.  Left Ear: Tympanic membrane and ear canal normal.  Mouth/Throat: Uvula is midline, oropharynx is clear and moist and mucous membranes are normal. Dental caries present. No dental abscesses or edematous.    ttp of the right upper teeth, dental caries present.  Right sided STS of the face.  No obvious fluctuance of the right upper gums. Airway patent, no trismus  Eyes: Conjunctivae and EOM are normal. Pupils are equal, round, and reactive to light.  Neck: Normal range of motion. Neck supple.  Cardiovascular: Normal rate, regular rhythm, normal heart sounds and intact distal pulses.   No murmur heard. Pulmonary/Chest: Effort normal and breath sounds normal. No respiratory distress.  Musculoskeletal: Normal range of motion.  Lymphadenopathy:    She has no cervical adenopathy.  Neurological: She is alert and oriented to person,  place, and time. She exhibits normal muscle tone. Coordination normal.  Skin: Skin is warm and dry.    ED Course  Procedures (including critical care time)  Labs Reviewed - No data to display No results found.      MDM    Multiple dental caries with ttp of the upper right premolar and first molar.  No obvious fluctuance of the gums or erythema.  Patient does have right sided facial edema.  VSS.  Non-toxic appearing.  No trismus or sublingual abnml.  Likely dental abscess.    Pt reviewed on the [place] narcotics database.  Monthly prescriptions for clonazepam, bu t no recent narcotics.    BP improved on recheck.  Patient alert, denies dizziness.  Agrees to f/u with her dentist this week  The patient appears reasonably screened and/or stabilized for discharge and I doubt any other medical condition or other [facility] requiring further screening, evaluation, or treatment in the ED at this time prior to discharge.   Passion Lavin L. Trisha Mangle, PA-C 04/20/13 2242

## 2013-04-19 NOTE — ED Notes (Addendum)
Rt maxillary dental pain , onset yesterday.  Drowsy at triage, says she has been unable to sleep

## 2013-04-21 NOTE — ED Provider Notes (Signed)
Medical screening examination/treatment/procedure(s) were performed by non-physician practitioner and as supervising physician I was immediately available for consultation/collaboration.  Kealie Barrie L Charl Wellen, MD 04/21/13 0150 

## 2014-02-22 ENCOUNTER — Emergency Department (HOSPITAL_COMMUNITY)
Admission: EM | Admit: 2014-02-22 | Discharge: 2014-02-22 | Disposition: A | Discharge status: Home / Self Care | Payer: Medicaid Other | Attending: Emergency Medicine | Admitting: Emergency Medicine

## 2014-02-22 ENCOUNTER — Encounter (HOSPITAL_COMMUNITY): Payer: Self-pay | Admitting: Emergency Medicine

## 2014-02-22 DIAGNOSIS — Y9389 Activity, other specified: Secondary | ICD-10-CM | POA: Insufficient documentation

## 2014-02-22 DIAGNOSIS — T07XXXA Unspecified multiple injuries, initial encounter: Secondary | ICD-10-CM | POA: Insufficient documentation

## 2014-02-22 DIAGNOSIS — S161XXA Strain of muscle, fascia and tendon at neck level, initial encounter: Secondary | ICD-10-CM

## 2014-02-22 DIAGNOSIS — Y92410 Unspecified street and highway as the place of occurrence of the external cause: Secondary | ICD-10-CM | POA: Insufficient documentation

## 2014-02-22 DIAGNOSIS — S139XXA Sprain of joints and ligaments of unspecified parts of neck, initial encounter: Secondary | ICD-10-CM | POA: Insufficient documentation

## 2014-02-22 DIAGNOSIS — Z880 Allergy status to penicillin: Secondary | ICD-10-CM | POA: Insufficient documentation

## 2014-02-22 DIAGNOSIS — F411 Generalized anxiety disorder: Secondary | ICD-10-CM | POA: Insufficient documentation

## 2014-02-22 DIAGNOSIS — F17200 Nicotine dependence, unspecified, uncomplicated: Secondary | ICD-10-CM | POA: Insufficient documentation

## 2014-02-22 DIAGNOSIS — Z791 Long term (current) use of non-steroidal anti-inflammatories (NSAID): Secondary | ICD-10-CM | POA: Insufficient documentation

## 2014-02-22 DIAGNOSIS — R52 Pain, unspecified: Secondary | ICD-10-CM

## 2014-02-22 DIAGNOSIS — G8929 Other chronic pain: Secondary | ICD-10-CM | POA: Insufficient documentation

## 2014-02-22 MED ORDER — HYDROMORPHONE HCL PF 1 MG/ML IJ SOLN
0.5000 mg | Freq: Once | INTRAMUSCULAR | Status: AC
  Administered 2014-02-22: 0.5 mg via INTRAMUSCULAR

## 2014-02-22 MED ORDER — OXYCODONE HCL 5 MG PO TABS: 5.0000 mg | ORAL_TABLET | ORAL | Status: DC | PRN

## 2014-02-22 MED ORDER — DIAZEPAM 5 MG PO TABS
2.5000 mg | ORAL_TABLET | Freq: Once | ORAL | Status: AC
  Administered 2014-02-22: 2.5 mg via ORAL
  Filled 2014-02-22: qty 1

## 2014-02-22 MED ORDER — HYDROMORPHONE HCL PF 2 MG/ML IJ SOLN
INTRAMUSCULAR | Status: AC
  Filled 2014-02-22: qty 1

## 2014-02-22 MED ORDER — KETOROLAC TROMETHAMINE 30 MG/ML IJ SOLN
30.0000 mg | Freq: Once | INTRAMUSCULAR | Status: AC
  Administered 2014-02-22: 30 mg via INTRAMUSCULAR
  Filled 2014-02-22: qty 1

## 2014-02-22 MED ORDER — NAPROXEN 250 MG PO TABS: 250.0000 mg | ORAL_TABLET | Freq: Three times a day (TID) | ORAL | Status: DC
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
## 2014-03-15 ENCOUNTER — Encounter (HOSPITAL_COMMUNITY): Payer: Self-pay | Admitting: Emergency Medicine

## 2014-03-15 ENCOUNTER — Emergency Department (HOSPITAL_COMMUNITY)
Admission: EM | Admit: 2014-03-15 | Discharge: 2014-03-15 | Disposition: A | Discharge status: Home / Self Care | Payer: MEDICAID | Source: Home / Self Care | Attending: Emergency Medicine | Admitting: Emergency Medicine

## 2014-03-15 ENCOUNTER — Emergency Department (HOSPITAL_COMMUNITY)
Admission: EM | Admit: 2014-03-15 | Discharge: 2014-03-16 | Disposition: A | Discharge status: Home / Self Care | Payer: MEDICAID | Attending: Emergency Medicine | Admitting: Emergency Medicine

## 2014-03-15 DIAGNOSIS — Z792 Long term (current) use of antibiotics: Secondary | ICD-10-CM | POA: Insufficient documentation

## 2014-03-15 DIAGNOSIS — Z791 Long term (current) use of non-steroidal anti-inflammatories (NSAID): Secondary | ICD-10-CM

## 2014-03-15 DIAGNOSIS — G8929 Other chronic pain: Secondary | ICD-10-CM | POA: Insufficient documentation

## 2014-03-15 DIAGNOSIS — Z79899 Other long term (current) drug therapy: Secondary | ICD-10-CM | POA: Insufficient documentation

## 2014-03-15 DIAGNOSIS — Z880 Allergy status to penicillin: Secondary | ICD-10-CM | POA: Insufficient documentation

## 2014-03-15 DIAGNOSIS — F17200 Nicotine dependence, unspecified, uncomplicated: Secondary | ICD-10-CM | POA: Insufficient documentation

## 2014-03-15 DIAGNOSIS — F411 Generalized anxiety disorder: Secondary | ICD-10-CM

## 2014-03-15 DIAGNOSIS — Z3202 Encounter for pregnancy test, result negative: Secondary | ICD-10-CM | POA: Insufficient documentation

## 2014-03-15 DIAGNOSIS — F1120 Opioid dependence, uncomplicated: Secondary | ICD-10-CM

## 2014-03-15 DIAGNOSIS — R6883 Chills (without fever): Secondary | ICD-10-CM | POA: Insufficient documentation

## 2014-03-15 DIAGNOSIS — R110 Nausea: Secondary | ICD-10-CM | POA: Insufficient documentation

## 2014-03-15 DIAGNOSIS — J3489 Other specified disorders of nose and nasal sinuses: Secondary | ICD-10-CM | POA: Insufficient documentation

## 2014-03-15 DIAGNOSIS — N390 Urinary tract infection, site not specified: Secondary | ICD-10-CM

## 2014-03-15 LAB — COMPREHENSIVE METABOLIC PANEL
ALBUMIN: 3.5 g/dL (ref 3.5–5.2)
ALK PHOS: 71 U/L (ref 39–117)
ALT: 13 U/L (ref 0–35)
AST: 11 U/L (ref 0–37)
BUN: 11 mg/dL (ref 6–23)
CO2: 28 mEq/L (ref 19–32)
Calcium: 9.2 mg/dL (ref 8.4–10.5)
Chloride: 103 mEq/L (ref 96–112)
Creatinine, Ser: 0.64 mg/dL (ref 0.50–1.10)
GFR calc Af Amer: >90 mL/min (ref >90)
GFR calc non Af Amer: >90 mL/min (ref >90)
Glucose, Bld: 102 mg/dL — ABNORMAL HIGH (ref 70–99)
POTASSIUM: 3.4 meq/L — AB (ref 3.7–5.3)
SODIUM: 141 meq/L (ref 137–147)
TOTAL PROTEIN: 7.2 g/dL (ref 6.0–8.3)
Total Bilirubin: 0.2 mg/dL — ABNORMAL LOW (ref 0.3–1.2)

## 2014-03-15 LAB — CBC WITH DIFFERENTIAL/PLATELET
BASOS ABS: 0 10*3/uL (ref 0.0–0.1)
BASOS PCT: 0 % (ref 0–1)
BASOS PCT: 1 % (ref 0–1)
Basophils Absolute: 0 10*3/uL (ref 0.0–0.1)
EOS ABS: 0.1 10*3/uL (ref 0.0–0.7)
EOS ABS: 0.1 10*3/uL (ref 0.0–0.7)
EOS PCT: 2 % (ref 0–5)
Eosinophils Relative: 1 % (ref 0–5)
HCT: 42.1 % (ref 36.0–46.0)
HCT: 41.4 % (ref 36.0–46.0)
Hemoglobin: 13.8 g/dL (ref 12.0–15.0)
Hemoglobin: 13.6 g/dL (ref 12.0–15.0)
LYMPHS PCT: 37 % (ref 12–46)
Lymphocytes Relative: 24 % (ref 12–46)
Lymphs Abs: 2.4 10*3/uL (ref 0.7–4.0)
Lymphs Abs: 2.8 10*3/uL (ref 0.7–4.0)
MCH: 26.3 pg (ref 26.0–34.0)
MCH: 26.3 pg (ref 26.0–34.0)
MCHC: 32.8 g/dL (ref 30.0–36.0)
MCHC: 32.9 g/dL (ref 30.0–36.0)
MCV: 80.3 fL (ref 78.0–100.0)
MCV: 80.1 fL (ref 78.0–100.0)
Monocytes Absolute: 0.5 10*3/uL (ref 0.1–1.0)
Monocytes Absolute: 0.4 10*3/uL (ref 0.1–1.0)
Monocytes Relative: 5 % (ref 3–12)
Monocytes Relative: 5 % (ref 3–12)
NEUTROS PCT: 70 % (ref 43–77)
Neutro Abs: 7.1 10*3/uL (ref 1.7–7.7)
Neutro Abs: 4.3 10*3/uL (ref 1.7–7.7)
Neutrophils Relative %: 55 % (ref 43–77)
PLATELETS: 299 10*3/uL (ref 150–400)
PLATELETS: 312 10*3/uL (ref 150–400)
RBC: 5.24 MIL/uL — AB (ref 3.87–5.11)
RBC: 5.17 MIL/uL — ABNORMAL HIGH (ref 3.87–5.11)
RDW: 13.3 % (ref 11.5–15.5)
RDW: 13.2 % (ref 11.5–15.5)
WBC: 10.1 10*3/uL (ref 4.0–10.5)
WBC: 7.7 10*3/uL (ref 4.0–10.5)

## 2014-03-15 LAB — URINALYSIS, ROUTINE W REFLEX MICROSCOPIC
GLUCOSE, UA: NEGATIVE mg/dL
KETONES UR: NEGATIVE mg/dL
LEUKOCYTES UA: NEGATIVE
Nitrite: POSITIVE — AB
Specific Gravity, Urine: >1.03 — ABNORMAL HIGH (ref 1.005–1.030)
Urobilinogen, UA: 0.2 mg/dL (ref 0.0–1.0)
pH: 6 (ref 5.0–8.0)

## 2014-03-15 LAB — RAPID URINE DRUG SCREEN, HOSP PERFORMED
AMPHETAMINES: NOT DETECTED
Amphetamines: NOT DETECTED
BARBITURATES: NOT DETECTED
BARBITURATES: NOT DETECTED
BENZODIAZEPINES: POSITIVE — AB
BENZODIAZEPINES: NOT DETECTED
Cocaine: NOT DETECTED
Cocaine: NOT DETECTED
Opiates: POSITIVE — AB
Opiates: POSITIVE — AB
Tetrahydrocannabinol: POSITIVE — AB
Tetrahydrocannabinol: POSITIVE — AB

## 2014-03-15 LAB — BASIC METABOLIC PANEL
BUN: 10 mg/dL (ref 6–23)
CALCIUM: 8.9 mg/dL (ref 8.4–10.5)
CO2: 26 meq/L (ref 19–32)
CREATININE: 0.63 mg/dL (ref 0.50–1.10)
Chloride: 101 mEq/L (ref 96–112)
GFR calc Af Amer: >90 mL/min (ref >90)
GFR calc non Af Amer: >90 mL/min (ref >90)
Glucose, Bld: 102 mg/dL — ABNORMAL HIGH (ref 70–99)
Potassium: 3.2 mEq/L — ABNORMAL LOW (ref 3.7–5.3)
Sodium: 140 mEq/L (ref 137–147)

## 2014-03-15 LAB — URINE MICROSCOPIC-ADD ON

## 2014-03-15 LAB — PREGNANCY, URINE: Preg Test, Ur: NEGATIVE

## 2014-03-15 LAB — ETHANOL

## 2014-03-15 MED ORDER — ONDANSETRON 8 MG PO TBDP
8.0000 mg | ORAL_TABLET | Freq: Once | ORAL | Status: AC
  Administered 2014-03-15: 8 mg via ORAL
  Filled 2014-03-15: qty 1

## 2014-03-15 MED ORDER — SULFAMETHOXAZOLE-TMP DS 800-160 MG PO TABS
1.0000 | ORAL_TABLET | Freq: Once | ORAL | Status: AC
  Administered 2014-03-15: 1 via ORAL
  Filled 2014-03-15: qty 1

## 2014-03-15 MED ORDER — LORAZEPAM 1 MG PO TABS
1.0000 mg | ORAL_TABLET | Freq: Once | ORAL | Status: AC
  Administered 2014-03-15: 1 mg via ORAL
  Filled 2014-03-15: qty 1

## 2014-03-15 MED ORDER — SULFAMETHOXAZOLE-TMP DS 800-160 MG PO TABS: 1.0000 | ORAL_TABLET | Freq: Two times a day (BID) | ORAL | Status: DC

## 2014-03-15 NOTE — ED Provider Notes (Signed)
CSN: [id number]     Arrival date & time 03/15/14  1949 History  This chart was scribed for Donnetta Hutching, MD by Danella Maiers, ED Scribe. This patient was seen in room APA15/APA15 and the patient's care was started at 8:01 PM.    No chief complaint on file.    (Consider location/radiation/quality/duration/timing/severity/associated sxs/prior Treatment) The history is provided by the patient. No language interpreter was used.   HPI Comments: Heidi [name] is a 28 y.o. female who presents to the Emergency Department complaining of needing detox from Roxicodone. She was seen here this morning at 6am to get medically cleared to go to [facility]. She states when she went to [facility] this afternoon they told her she needed to return here for detox. She has been taking roxicodone for 2 years. She states she started taking them recreationally to get high. She also reports using marijuana and cigarettes. She denies cocaine and alcohol use. She reports having hot and cold spills, rhinorrhea, nausea, and feeling agitated currently.    Past Medical History  Diagnosis Date  . Chronic shoulder pain   . Anxiety    Past Surgical History  Procedure Laterality Date  . Ankle surgery     History reviewed. No pertinent family history. History  Substance Use Topics  . Smoking status: Current Every Day Smoker -- 1.00 packs/day    Types: Cigarettes  . Smokeless tobacco: Not on file  . Alcohol Use: No   OB History   Grav Para Term Preterm Abortions TAB SAB Ect Mult Living                 Review of Systems  Constitutional: Positive for chills.  HENT: Positive for rhinorrhea.   Gastrointestinal: Positive for nausea.  Psychiatric/Behavioral: The patient is nervous/anxious.    A complete 10 system review of systems was obtained and all systems are negative except as noted in the HPI and PMH.     Allergies  Penicillins; Tylenol; Venlafaxine; and Morphine and related  Home Medications   Current  Outpatient Rx  Name  Route  Sig  Dispense  Refill  . clonazePAM (KLONOPIN) 1 MG tablet   Oral   Take 1 mg by mouth 3 (three) times daily as needed. For anxiety         . sulfamethoxazole-trimethoprim (BACTRIM DS) 800-160 MG per tablet   Oral   Take 1 tablet by mouth 2 (two) times daily.   9 tablet   0    BP 105/71  Pulse 72  Temp(Src) 98.7 F (37.1 C) (Oral)  Resp 18  Ht 5\' 7"  (1.702 m)  Wt 124 lb (56.246 kg)  BMI 19.42 kg/m2  SpO2 100% Physical Exam  Nursing note and vitals reviewed. Constitutional: She is oriented to person, place, and time. She appears well-developed and well-nourished.  HENT:  Head: Normocephalic and atraumatic.  Eyes: Conjunctivae and EOM are normal. Pupils are equal, round, and reactive to light.  Neck: Normal range of motion. Neck supple.  Cardiovascular: Normal rate, regular rhythm and normal heart sounds.   Pulmonary/Chest: Effort normal and breath sounds normal.  Abdominal: Soft. Bowel sounds are normal.  Musculoskeletal: Normal range of motion.  Neurological: She is alert and oriented to person, place, and time.  Skin: Skin is warm and dry.  Psychiatric: Her behavior is normal.  Depressed affect    ED Course  Procedures (including critical care time) Medications  LORazepam (ATIVAN) tablet 1 mg (1 mg Oral Given 03/15/14 2031)  ondansetron (ZOFRAN-ODT)  disintegrating tablet 8 mg (8 mg Oral Given 03/15/14 2031)    DIAGNOSTIC STUDIES: Oxygen Saturation is 100% on RA, normal by my interpretation.    COORDINATION OF CARE: 8:04 PM- Discussed treatment plan with pt which includes psych consult and Ativan. Pt agrees to plan.    Labs Review Labs Reviewed  BASIC METABOLIC PANEL - Abnormal; Notable for the following:    Potassium 3.2 (*)    Glucose, Bld 102 (*)    All other components within normal limits  CBC WITH DIFFERENTIAL - Abnormal; Notable for the following:    RBC 5.17 (*)    All other components within normal limits  URINE RAPID  DRUG SCREEN (HOSP PERFORMED) - Abnormal; Notable for the following:    Opiates POSITIVE (*)    Tetrahydrocannabinol POSITIVE (*)    All other components within normal limits  ETHANOL   Imaging Review No results found.   EKG Interpretation None      MDM   Final diagnoses:  Opiate addiction    Patient has long-standing problem with opiate addiction. Will consult behavioral health. No suicidal or homicidal ideation.  I personally performed the services described in this documentation, which was scribed in my presence. The recorded information has been reviewed and is accurate.    Donnetta HutchingBrian Ardell Aaronson, MD 03/15/14 2226

## 2014-03-15 NOTE — ED Provider Notes (Signed)
CSN: [id number]     Arrival date & time 03/15/14  0617 History   First MD Initiated Contact with Patient 03/15/14 0700     Chief Complaint  Patient presents with  . V70.1    The history is provided by the patient.   patient has been abusing Roxicodone. She went to [facility] seeking assistance with her opiate addiction. Patient is going to received treatment at [facility].  She was told to come to the emergency room to be medically cleared. Patient states she did take some oxycodone today. She does not feel like she is withdrawing. She denies trouble with nausea, vomiting, chest pain, shortness of breath, or abdominal pain. Patient states she's not having any medical problems.  Past Medical History  Diagnosis Date  . Chronic shoulder pain   . Anxiety    Past Surgical History  Procedure Laterality Date  . Ankle surgery     No family history on file. History  Substance Use Topics  . Smoking status: Current Every Day Smoker -- 1.00 packs/day    Types: Cigarettes  . Smokeless tobacco: Not on file  . Alcohol Use: No   OB History   Grav Para Term Preterm Abortions TAB SAB Ect Mult Living                 Review of Systems  All other systems reviewed and are negative.      Allergies  Penicillins; Tylenol; Venlafaxine; and Morphine and related  Home Medications   Current Outpatient Rx  Name  Route  Sig  Dispense  Refill  . clonazePAM (KLONOPIN) 1 MG tablet   Oral   Take 1 mg by mouth 3 (three) times daily as needed. For anxiety         . oxyCODONE (ROXICODONE) 5 MG immediate release tablet   Oral   Take 1-2 tablets (5-10 mg total) by mouth every 4 (four) hours as needed for severe pain.   20 tablet   0   . clindamycin (CLEOCIN) 150 MG capsule   Oral   Take 2 capsules (300 mg total) by mouth 4 (four) times daily. For 7 days   56 capsule   0   . HYDROcodone-acetaminophen (NORCO/VICODIN) 5-325 MG per tablet   Oral   Take 2 tablets by mouth every 4 (four) hours as needed  for pain.   15 tablet   0   . HYDROcodone-acetaminophen (NORCO/VICODIN) 5-325 MG per tablet      Take one-two tabs po q 4-6 hrs prn pain   12 tablet   0   . ibuprofen (ADVIL,MOTRIN) 200 MG tablet   Oral   Take 800 mg by mouth every 6 (six) hours as needed. FOR PAIN OTC          . naproxen (NAPROSYN) 250 MG tablet   Oral   Take 1 tablet (250 mg total) by mouth 3 (three) times daily with meals.   21 tablet   0    BP 102/49  Pulse 72  Temp(Src) 98.7 F (37.1 C)  Resp 16  Ht 5\' 7"  (1.702 m)  Wt 124 lb (56.246 kg)  BMI 19.42 kg/m2  SpO2 100% Physical Exam  Nursing note and vitals reviewed. Constitutional: She appears well-developed and well-nourished. No distress.  HENT:  Head: Normocephalic and atraumatic.  Right Ear: External ear normal.  Left Ear: External ear normal.  Eyes: Conjunctivae are normal. Right eye exhibits no discharge. Left eye exhibits no discharge. No scleral icterus.  Neck:  Neck supple. No tracheal deviation present.  Cardiovascular: Normal rate and regular rhythm.   Pulmonary/Chest: Effort normal and breath sounds normal. No stridor. No respiratory distress. She has no wheezes. She has no rales.  Abdominal: Soft. Bowel sounds are normal. She exhibits no distension. There is no tenderness. There is no rebound and no guarding.  Musculoskeletal: She exhibits no edema.  Neurological: She is alert. Cranial nerve deficit: no gross deficits.  Skin: Skin is warm and dry. No rash noted.  Psychiatric: Her affect is blunt.    ED Course  Procedures (including critical care time) Labs Review Labs Reviewed  URINALYSIS, ROUTINE W REFLEX MICROSCOPIC - Abnormal; Notable for the following:    Specific Gravity, Urine >1.030 (*)    Hgb urine dipstick MODERATE (*)    Bilirubin Urine SMALL (*)    Protein, ur TRACE (*)    Nitrite POSITIVE (*)    All other components within normal limits  URINE RAPID DRUG SCREEN (HOSP PERFORMED) - Abnormal; Notable for the  following:    Opiates POSITIVE (*)    Benzodiazepines POSITIVE (*)    Tetrahydrocannabinol POSITIVE (*)    All other components within normal limits  URINE MICROSCOPIC-ADD ON - Abnormal; Notable for the following:    Bacteria, UA MANY (*)    All other components within normal limits  CBC WITH DIFFERENTIAL - Abnormal; Notable for the following:    RBC 5.24 (*)    All other components within normal limits  COMPREHENSIVE METABOLIC PANEL - Abnormal; Notable for the following:    Potassium 3.4 (*)    Glucose, Bld 102 (*)    Total Bilirubin 0.2 (*)    All other components within normal limits  URINE CULTURE  PREGNANCY, URINE  ETHANOL    MDM   Final diagnoses:  UTI (lower urinary tract infection)  Opiate addiction   UA consistent with UTI.    Pt denies any symptoms.  No fever or vomiting.  No back or abdominal pain.  Otherwise medically stable.  Rx BActrim     Celene Kras, MD 03/15/14 [phone]

## 2014-03-15 NOTE — ED Notes (Signed)
wanded at triage 

## 2014-03-15 NOTE — ED Notes (Addendum)
Requesting detox from roxicodone, sent here by [facility] to get medical clearance prior [facility].

## 2014-03-15 NOTE — ED Notes (Signed)
Pt placed in green scrubs and clothing locked in locker in [facility]. Pt's jewelry and pocketbook was sent home with pt's mother.

## 2014-03-15 NOTE — Discharge Instructions (Signed)
Antibiotic Medication Antibiotics are among the most frequently prescribed medicines. Antibiotics cure illness by assisting our body to injure or kill the bacteria that cause infection. While antibiotics are useful to treat a wide variety of infections they are useless against viruses. Antibiotics cannot cure colds, flu, or other viral infections.  There are many types of antibiotics available. Your caregiver will decide which antibiotic will be useful for an illness. Never take or give someone else's antibiotics or left over medicine. Your caregiver may also take into account:  Allergies.  The cost of the medicine.  Dosing schedules.  Taste.  Common side effects when choosing an antibiotic for an infection. Ask your caregiver if you have questions about why a certain medicine was chosen. HOME CARE INSTRUCTIONS Read all instructions and labels on medicine bottles carefully. Some antibiotics should be taken on an empty stomach while others should be taken with food. Taking antibiotics incorrectly may reduce how well they work. Some antibiotics need to be kept in the refrigerator. Others should be kept at room temperature. Ask your caregiver or pharmacist if you do not understand how to give the medicine. Be sure to give the amount of medicine your caregiver has prescribed. Even if you feel better and your symptoms improve, bacteria may still remain alive in the body. Taking all of the medicine will prevent:  The infection from returning and becoming harder to treat.  Complications from partially treated infections. If there is any medicine left over after you have taken the medicine as your caregiver has instructed, throw the medicine away. Be sure to tell your caregiver if you:  Are allergic to any medicines.  Are pregnant or intend to become pregnant while using this medicine.  Are breastfeeding.  Are taking any other prescription, non-prescription medicine, or herbal  remedies.  Have any other medical conditions or problems you have not already discussed. If you are taking birth control pills, they may not work while you are on antibiotics. To avoid unwanted pregnancy:  Continue taking your birth control pills as usual.  Use a second form of birth control (such as condoms) while you are taking antibiotic medicine.  When you finish taking the antibiotic medicine, continue using the second form of birth control until you are finished with your current 1 month cycle of birth control pills. Try not to miss any doses of medicine. If you miss a dose, take it as soon as possible. However, if it is almost time for the next dose and the dosing schedule is:  2 doses a day, take the missed dose and the next dose 5 to 6 hours apart.  3 or more doses a day, take the missed dose and the next dose 2 to 4 hours apart, then go back to the normal schedule.  If you are unable to make up a missed dose, take the next scheduled dose on time and complete the missed dose at the end of the prescribed time for your medicine. SIDE EFFECTS TO TAKING ANTIBIOTICS Common side effects to antibiotic use include:  Soft stools or diarrhea.  Mild stomach upset.  Sun sensitivity. SEEK MEDICAL CARE IF:   If you get worse or do not improve within a few days of starting the medicine.  Vomiting develops.  Diaper rash or rash on the genitals appears.  Vaginal itching occurs.  White patches appear on the tongue or in the mouth.  Severe watery diarrhea and abdominal cramps occur.  Signs of an allergy develop (hives, unknown  itchy rash appears). STOP TAKING THE ANTIBIOTIC. SEEK IMMEDIATE MEDICAL CARE IF:   Urine turns dark or blood colored.  Skin turns yellow.  Easy bruising or bleeding occurs.  Joint pain or muscle aches occur.  Fever returns.  Severe headache occurs.  Signs of an allergy develop (trouble breathing, wheezing, swelling of the lips, face or tongue,  fainting, or blisters on the skin or in the mouth). STOP TAKING THE ANTIBIOTIC. Document Released: 08/14/2004 Document Revised: 02/24/2012 Document Reviewed: 08/24/2009 [facility] Patient Information 2014 [employer] [place].  Heroin Abuse and Withdrawal Heroin is an illegal opiate (narcotic) drug. It is very addictive. Once the effects of the drug are felt, the need to use the drug again (known as craving) is strong. When the drug is suddenly stopped after using it on a regular basis, significant and painful physical withdrawal symptoms are experienced.  EFFECTS OF HEROIN USE AND ABUSE The drug produces a profound feeling of pleasure and relaxation (euphoria) that lasts for a short time. This is followed by sleepiness and then agitation with craving for more drugs. After a short time of regular use (days or weeks), dependence on the drug is developed. This means the user will become ill without it (withdrawal) and needs to keep using the drug to function. This is how fast heroin abuse becomes physical dependence and addiction (chemical dependency). EFFECTS ON THE BRAIN Heroin is addictive because it activates regions of the brain that are responsible for producing both the pleasurable sensation of "reward" and physical dependence. Together, these actions account for the user's loss of control and the rapid development of drug dependence. HOW IT IS USED  Heroin is a drug that is mostly taken by a shot with a needle (injection) in the vein. Using a needle can have harmful effects on the user. Often times, the needle is unclean (unsterile), the heroin is contaminated with cutting agents, or heroin is used in combination with other drugs such as alcohol or cocaine.  In recent years, the purity of street heroin has increased a lot allowing users to "snort" the drug into their noses where it is absorbed through the lining of the nasal passages. You can become addicted this way. Many IV heroin users report they  started by snorting. RELATED PROBLEMS  Needle sharing can cause AIDS. The AIDS virus is carried in contaminated blood left in the needle, syringe or other drug-related equipment. When unclean needles are used, the AIDS virus is injected into the new user. There is no cure or proven vaccine for AIDS to prevent it.  Heroin use during pregnancy is associated with stillbirths. It can also increase the chance for miscarriage. Babies born addicted to heroin must undergo withdrawal after birth. These babies show a number of developmental problems.  Heroin use can cause serious health problems such as liver infection (hepatitis), skin abscesses, vein inflammation and heart disease. SYMPTOMS The signs and symptoms of heroin use include:  Euphoria.  Drowsiness.  Respiratory depression (which can progress until breathing stops).  Small (constricted) pupils and nausea.  Need for increasingly higher doses of the drug to get the same effect.  Weight loss.  "Track" marks (scars) on arms and legs.  Emotional instability. Symptoms of a heroin overdose include:   Shallow breathing.  Pinpoint pupils.  Clammy skin.  Convulsion.  Coma. WITHDRAWAL EFFECTS Withdrawal symptoms include:  Watery eyes.  Runny nose.  Yawning.  Loss of appetite.  Tremors.  Panic.  Chills.  Sweating.  Nausea.  Muscle cramps.  Not being able to sleep well (insomnia).  Depression and mood swings.  Elevations in blood pressure, pulse, respiratory rate and temperature. RISK OF OVERDOSE Because the drug is produced illegally, heroin users risk taking an unusually potent dose (due to differences in purity). This can lead to overdose, coma and possible death. Especially dangerous is the situation where a user has been "clean" (abstinent) from using the drug for a period of time and then relapses. Fatal overdoses can occur because the user fails to account for the change in the body's tolerance to the  drug. TREATMENT  There are many treatment options for heroin addiction. They include medications as well as behavioral therapies. Research has demonstrated that many people can successfully stop using heroin when medication is combined with other supportive services. Patients can return to stable and productive lives, living completely free of all narcotics. Some of the medications and other treatment approaches used are:  Methadone. This is a medication that blocks the effects of heroin for about 24 hours. It has a proven record of success when prescribed at a high enough dosage level for people addicted to heroin.  Naloxone may be used to treat cases of overdose, as well as naltrexone. Both of these block the effects of morphine, heroin and other opiates. People who have become free of narcotics often take naltrexone to avoid relapse.  Buprenorphine is now available for treating addiction to heroin and other opiates. This medication offers less risk of addiction. It can be dispensed in the privacy of a doctor's office.  Several other medications for use in heroin treatment programs are also under study.  There are many effective behavioral treatments available for heroin addiction. These can include residential and outpatient approaches. 12-step programs, such as Narcotics Anonymous, have also proven to be effective. HOME CARE INSTRUCTIONS  Some individuals can successfully stop using heroin at home with medically assisted detoxification. This requires following your caregiver's instructions very carefully. This may include use of some of the following medications and therapies:  Stomach medicine can be used for the nausea.  Imodium can be used for the diarrhea.  Benadryl can be used for the sleeplessness and restlessness.  Anxiety medication such as Xanax or Ativan. These must be administered exactly as prescribed, usually with the help of a home caregiver.  Avoiding dehydration by  drinking more fluids than usual. While water alone is fine, any non-alcoholic fluid is okay (soup, juice, etc.).  Providing quiet, calm support and cooling or warmth as needed.  Call your local emergency services (911 in [place] if seizures occur or if the patient is unable to keep liquids down.  Keep a written record of medications given and times given. Addiction cannot be cured but it can be treated successfully.   Treatment centers are listed in the yellow pages under:  Substance Abuse.  Narcotics Anonymous.  Drug Abuse Counseling.  Most hospitals and clinics can refer you to a specialized care center.  The [place] government maintains a toll-free number for obtaining treatment referrals:1-787 339 5409 or 423-265-2900 (TDD) and maintains a website: http://findtreatment.RockToxic.pl.  Other websites for additional information are:  www.mentalhealth.[employer].pl.  GreatestFeeling.tn.  In [place] treatment resources are listed in each [place] with listings available under:  Oncologist for [employer] or similar titles. Document Released: 12/05/2003 Document Revised: 02/24/2012 Document Reviewed: 11/18/2008 [facility] Patient Information 2014 [place], [place].  Urinary Tract Infection A urinary tract infection (UTI) can occur any place along the urinary tract. The tract includes the kidneys, ureters, bladder,  and urethra. A type of germ called bacteria often causes a UTI. UTIs are often helped with antibiotic medicine.  HOME CARE   If given, take antibiotics as told by your doctor. Finish them even if you start to feel better.  Drink enough fluids to keep your pee (urine) clear or pale yellow.  Avoid tea, drinks with caffeine, and bubbly (carbonated) drinks.  Pee often. Avoid holding your pee in for a long time.  Pee before and after having sex (intercourse).  Wipe from front to back after you poop (bowel movement) if you are a woman. Use each tissue only once. GET HELP RIGHT AWAY  IF:   You have back pain.  You have lower belly (abdominal) pain.  You have chills.  You feel sick to your stomach (nauseous).  You throw up (vomit).  Your burning or discomfort with peeing does not go away.  You have a fever.  Your symptoms are not better in 3 days. MAKE SURE YOU:   Understand these instructions.  Will watch your condition.  Will get help right away if you are not doing well or get worse. Document Released: 05/20/2008 Document Revised: 08/26/2012 Document Reviewed: 07/02/2012 [facility] Patient Information 2014 [place], [place].

## 2014-03-15 NOTE — ED Notes (Signed)
Pt is here for detox, was d/c from here this am. Says she needs detox from opiates. Told by social worker to come to ER

## 2014-03-16 MED ORDER — IBUPROFEN 400 MG PO TABS: 600.0000 mg | ORAL_TABLET | Freq: Three times a day (TID) | ORAL | Status: DC | PRN

## 2014-03-16 MED ORDER — SULFAMETHOXAZOLE-TMP DS 800-160 MG PO TABS
1.0000 | ORAL_TABLET | Freq: Two times a day (BID) | ORAL | Status: DC
  Administered 2014-03-16: 1 via ORAL
  Filled 2014-03-16: qty 1

## 2014-03-16 MED ORDER — CLONAZEPAM 0.5 MG PO TABS
1.0000 mg | ORAL_TABLET | Freq: Three times a day (TID) | ORAL | Status: DC | PRN
  Administered 2014-03-16: 1 mg via ORAL
  Filled 2014-03-16 (×3): qty 2

## 2014-03-16 MED ORDER — NICOTINE 14 MG/24HR TD PT24
14.0000 mg | MEDICATED_PATCH | Freq: Every day | TRANSDERMAL | Status: DC
  Administered 2014-03-16: 14 mg via TRANSDERMAL
  Filled 2014-03-16: qty 1

## 2014-03-16 MED ORDER — ONDANSETRON HCL 4 MG PO TABS
4.0000 mg | ORAL_TABLET | Freq: Three times a day (TID) | ORAL | Status: DC | PRN
Start: 2014-03-16 — End: 2014-03-16
  Administered 2014-03-16: 4 mg via ORAL
  Filled 2014-03-16: qty 1

## 2014-03-16 NOTE — Progress Notes (Signed)
Referral faxed to [facility] as per Ray.

## 2014-03-16 NOTE — ED Provider Notes (Signed)
Pt has been declined at [facility] She does not voice any SI She is in no distress Stable for d/c home  Joya Gaskinsonald W Shacara Cozine, MD 03/16/14 1406

## 2014-03-16 NOTE — BH Assessment (Signed)
Obtained clinicals with EDP-Dr. Bebe ShaggyWickline prior to seeing this patient.

## 2014-03-16 NOTE — ED Notes (Signed)
Pt lying in bed with eyes closed. Even/rise fall of chest. nad noted.

## 2014-03-16 NOTE — Progress Notes (Signed)
Pt was declined by [facility] due to the medications she is on.  Liborio NixonJanice at [facility] stated that it would be "a difficult de-tox".  Other facilities will be explored.

## 2014-03-16 NOTE — ED Notes (Signed)
telepysch in progress at this time.

## 2014-03-16 NOTE — ED Notes (Signed)
Per chart pt not accepted to [facility]. Pt aware and Dr. Bebe ShaggyWickline in to discuss discharge.

## 2014-03-16 NOTE — ED Notes (Signed)
[name] to [facility] pt labs and vs for [facility].

## 2014-03-16 NOTE — ED Notes (Signed)
Pt still reports nausea, sweats and diarrhea at this time. Will inform MD.

## 2014-03-16 NOTE — ED Notes (Signed)
Pt dc instructions reviewed and explained. Pt voiced understanding. Pt not happy that facility said she was " difficult detox" per Dr she said. Belongings given to pt by V[facility] to [facility]

## 2014-03-16 NOTE — ED Notes (Signed)
Breakfast given to pt

## 2014-03-16 NOTE — Discharge Instructions (Signed)
Finding Treatment for Alcohol and Drug Addiction °It can be hard to find the right place to get professional treatment. Here are some important things to consider: °· There are different types of treatment to choose from. °· Some programs are live-in (residential) while others are not (outpatient). Sometimes a combination is offered. °· No single type of program is right for everyone. °· Most treatment programs involve a combination of education, counseling, and a 12-step, spiritually-based approach. °· There are non-spiritually based programs (not 12-step). °· Some treatment programs are government sponsored. They are geared for patients without private insurance. °· Treatment programs can vary in many respects such as: °· Cost and types of insurance accepted. °· Types of on-site medical services offered. °· Length of stay, setting, and size. °· Overall philosophy of treatment.  °A person may need specialized treatment or have needs not addressed by all programs. For example, adolescents need treatment appropriate for their age. Other people have secondary disorders that must be managed as well. Secondary conditions can include mental illness, such as depression or diabetes. Often, a period of detoxification from alcohol or drugs is needed. This requires medical supervision and not all programs offer this. °THINGS TO CONSIDER WHEN SELECTING A TREATMENT PROGRAM  °· Is the program certified by the appropriate government agency? Even private programs must be certified and employ certified professionals. °· Does the program accept your insurance? If not, can a payment plan be set up? °· Is the facility clean, organized, and well run? Do they allow you to speak with graduates who can share their treatment experience with you? Can you tour the facility? Can you meet with staff? °· Does the program meet the full range of individual needs? °· Does the treatment program address sexual orientation and physical disabilities?  Do they provide age, gender, and culturally appropriate treatment services? °· Is treatment available in languages other than English? °· Is long-term aftercare support or guidance encouraged and provided? °· Is assessment of an individual's treatment plan ongoing to ensure it meets changing needs? °· Does the program use strategies to encourage reluctant patients to remain in treatment long enough to increase the likelihood of success? °· Does the program offer counseling (individual or group) and other behavioral therapies? °· Does the program offer medicine as part of the treatment regimen, if needed? °· Is there ongoing monitoring of possible relapse? Is there a defined relapse prevention program? Are services or referrals offered to family members to ensure they understand addiction and the recovery process? This would help them support the recovering individual. °· Are 12-step meetings held at the center or is transport available for patients to attend outside meetings? °In countries outside of the [place], see local directories for contact information for services in your area. °Document Released: 10/31/2005 Document Revised: 02/24/2012 Document Reviewed: 05/12/2008 °ExitCare® Patient Information ©2014 [employer]. ° ° °Emergency Department Resource Guide °1) Find a Doctor and Pay Out of Pocket °Although you won't have to find out who is covered by your insurance plan, it is a good idea to ask around and get recommendations. You will then need to call the office and see if the doctor you have chosen will accept you as a new patient and what types of options they offer for patients who are self-pay. Some doctors offer discounts or will set up payment plans for their patients who do not have insurance, but you will need to ask so you aren't surprised when you get to your appointment. ° °  2) Contact Your Local Health Department °Not all health departments have doctors that can see patients for sick  visits, but many do, so it is worth a call to see if yours does. If you don't know where your local health department is, you can check in your phone book. The CDC also has a tool to help you locate your state's health department, and many state websites also have listings of all of their local health departments. ° °3) Find a Walk-in Clinic °If your illness is not likely to be very severe or complicated, you may want to try a walk in clinic. These are popping up all over the country in pharmacies, drugstores, and shopping centers. They're usually staffed by nurse practitioners or physician assistants that have been trained to treat common illnesses and complaints. They're usually fairly quick and inexpensive. However, if you have serious medical issues or chronic medical problems, these are probably not your best option. ° °No Primary Care Doctor: °- Call [facility] at  832-8000 - they can help you locate a primary care doctor that  accepts your insurance, provides certain services, etc. °- Physician Referral Service- 1-800-533-3463 ° °Chronic Pain Problems: °Organization         Address  Phone   Notes  °[facility]  (336) 297-2271 Patients need to be referred by their primary care doctor.  ° °Medication Assistance: °Organization         Address  Phone   Notes  °[place] [facility] [address] °[place], [place] [address] (336) 641-8030 --Must be a resident of [place] °-- Must have NO insurance coverage whatsoever (no Medicaid/ Medicare, etc.) °-- The pt. MUST have a primary care doctor that directs their care regularly and follows them in the community °  °[facility]  (866) 331-1348   °[employer]  (888) 892-1162   ° °Agencies that provide inexpensive medical care: °Organization         Address  Phone   Notes  °[facility]  (336) 832-8035   °[facility]    (336) 832-7272   °[facility] [address] °[place], [place] [address] (336) 832-4777   °[facility] [address], °[place] (336) 271-4999   °[facility]    (336) 373-0678   °[facility]    (336) 272-1050   °[facility] ° [address], [place] Phone:  (336) 832-4444, Fax:  (336) 832-4440 Hours of Operation:  9 am - 6 pm, M-F.  Also accepts Medicaid/Medicare and self-pay.  °[facility] ° [address], [place] Phone: (336) 832-3150, Fax: (336) 832-3151. Hours of Operation:  8:30 am - 5:30 pm, M-F.  Also accepts Medicaid and self-pay.  °[facility] [place] [address], [place] Phone: (336) 878-6027   °[facility] [address], [place], [place] (336)723-1848, Ext. 123 Mondays & Thursdays: 7-9 AM.  First 15 patients are seen on a first come, first serve basis. °  ° °Medicaid-accepting [place] Providers: ° °Organization         Address  Phone   Notes  °[facility] [address], [place] (336) 641-2100 Also accepts self-pay patients.  °[facility] [address], [place] ° (336) 856-9996   °[facility] [address], [place] (336) 288-8857   °[facility] [address] [place] [address], [place] (336) 299-7000   °Veita Bland [address], [place]  ° (336) 373-1557 Only accepts [place] Access Medicaid patients after they have their name applied to their card.  ° °Self-Pay (no insurance) in [place]: ° °Organization         Address  Phone   Notes  °Sickle Cell Patients, [facility] [address], [place] (336) 832-1970   °[facility] [address], [place] (336) 832-4400   °[facility] [place] ° [address], [place] (336) 992-4800   °[facility]/Dr. Osei-Bonsu ° [address] [place] [address],  [place] or [address], [place] (336) 841-8500 Phone number for both [place] and [place] locations is the same.  °[facility] [address], [place] (336) 299-0000   °[facility] [address] [place] [address], [place] or [address] (336) 852-7530 °(336) 878-2260   °[facility] [address], [place] (336) 350-1642, phone; (336) 294-5005, fax Sees patients 1st and 3rd Saturday of every month.  Must not qualify for public or private insurance (i.e. Medicaid, Medicare, [place] Health Choice, Veterans' Benefits) • Household income should be no more than 200% of the poverty level •The clinic cannot treat you if you are pregnant or think you are pregnant • Sexually transmitted diseases are not treated at the clinic.  ° ° °Dental Care: °Organization         Address  Phone  Notes  °[facility] [address], [place] (336) 641-6152 Accepts children up to age 21 who are enrolled in Medicaid or [place] Health Choice; pregnant women with a Medicaid card; and children who have applied for Medicaid or [place] Health Choice, but were declined, whose parents can pay a reduced fee at time of service.  °[facility] [place]  [address], [place] (336) 641-7733 Accepts children up to age 21 who are enrolled in Medicaid or [place] Health Choice; pregnant women with a Medicaid card; and children who have applied for Medicaid or  Health Choice, but were declined, whose parents can pay a reduced fee at time of service.  °[facility] ° [address],  (336) 641-4533 Patients are seen by appointment only. Walk-ins are not accepted. [facility] will see patients 18 years of age and older. °Monday - Tuesday (8am-5pm) °Most Wednesdays (8:30-5pm) °$30 per visit, cash only  °[facility] ° [address], [place] (336) 641-4533 Patients are seen by appointment only. Walk-ins are not accepted. [facility] will see patients 18 years of age and older. °One Wednesday Evening (Monthly: Volunteer Based).  $30 per visit, cash only  °[facility]  (919) 537-3737 for adults; Children under age 4, call Graduate Pediatric Dentistry at ([phone]) [phone]. Children aged 4-14, please call (919) [phone] to request a pediatric application. ° Dental services are provided in all areas of dental care including fillings, crowns and bridges, complete and partial dentures, implants, gum treatment, root canals, and extractions. Preventive care is also provided. Treatment is provided to both adults and children. °Patients are selected via a lottery and there is often a waiting list. °  °[facility] [address], °  [place] ° (336) 763-8833 [email or web address] °  °[facility] [address], [place], [place] (336)723-1848, Ext. 123 Second and Fourth Thursday of each month, opens at 6:30 AM; Clinic ends at 9 AM.  Patients are seen on a first-come first-served basis, and a limited number are seen during each clinic.  ° °[facility] ° [address], [place], [place] (336) 723-7904   Eligibility Requirements °You must have lived in [place], [place], or [place] counties for at least the last three months. °  You cannot be eligible for state or federal sponsored healthcare insurance, including Veterans Administration, Medicaid, or Medicare. °  You generally cannot be eligible for healthcare insurance through your employer.  °  How to apply: °Eligibility screenings are held every Tuesday and Wednesday afternoon from 1:00 pm until 4:00 pm. You do not need an appointment for the interview!  °[facility] [address], [place], [place] 336-631-2330   °[facility]  336-342-8273   °[facility]  336-703-3100   °[facility]  336-570-6415   ° °Behavioral Health Resources in the Community: °Intensive Outpatient Programs °Organization         Address  Phone  Notes  °[facility] [address], [place], [place] 336-878-6098   °[facility] [address], [place], [place] 336-832-9800   °[facility] [address], [place], [place] ° 336-882-2125   °[facility] [address],  °[place], [place] 1-800-853-5163 or 336-641-4981   °Substance Abuse Resources °Organization         Address  Phone  Notes  °[facility]  336-882-2125   °[facility]  336-784-9470   °[facility]  336-285-9073   °[facility]  336-845-3988   °Residential & Outpatient Substance Abuse Program  1-800-659-3381   °Psychological Services °Organization         Address  Phone  Notes  °[facility]  336- 832-9600   °[facility]  336- 378-7881   °[facility] [address], [place] 1-800-853-5163 or 336-641-4981   ° °Mobile Crisis Teams °Organization         Address  Phone  Notes  °[facility], Mobile Crisis Care Unit  1-877-626-1772   °Assertive °[facility] ° [address] [place], [place] 336-834-9664   °Sharon DeEsch [address] °[place] 336-554-5454   ° °Self-Help/Support Groups °Organization         Address  Phone             Notes  °[facility] - variety of support groups  336- 373-1402 Call for more information  °Narcotics Anonymous (NA), [facility] [address], °[place]  2 meetings at this location  ° °Residential Treatment Programs °Organization         Address  Phone  Notes  °[facility] [address],    °[place]  1-866-801-8205   °[facility] ° [address], [place], [place] 704-293-8524   °[facility] [address], [place]  336-845-3988 Admissions: 8am-3pm M-F  °[facility] [address]    °[place], [place] 336-841-1104   °[facility] [address], [place], [place] 336-379-7146   °[facility] [address]  °[place],  336-285-9073   °  [facility] Intensive Outpatient [address], [place], [place] 336-852-3033   °[facility] [address]  °[place], [place] 1-877-615-2722 or 336-784-9470   °[facility] [address] [place], [place] 336-227-7417 Accepts Medicaid  °[facility] [address]  °[place] 1-800-659-3381 Substance Abuse/Addiction Treatment  ° °[place] Behavioral Health Resources °Organization         Address  Phone  Notes  °[facility]  (888) 581-9988   °Julie Brannon, PhD [address] [place], [place]   (336) 349-5553 or (336) 951-0000   °[facility]   [address] °[place], [place] (336) 349-4454   °[facility] [address], [place], [place] (336) 342-8316 Insurance/Medicaid/sponsorship through [facility]  °[facility] [address]                                    [place], [place] (336) 342-8316 Therapy/tele-psych/case  °[facility] [address]  ° [place], [place] (336) 349-2233    °Dr. Arfeen  (336) 349-4544   °[facility] [place]  [employer] [place] [facility] 1) [address], [place] °2) [address], [place] °3)  [address], [place] (336) 349-3220 °(336) 342-7768 ° °(336) 342-8140   °[place] [facility] (336) 342-1394 or (336) 342-3537 (After Hours)    ° ° ° °

## 2014-03-16 NOTE — BH Assessment (Signed)
Tele Assessment Note   Heidi [name] is an 28 y.o. female that presented to [facility] for Opiate detox. Says that she started taking Roxicodone at age 67. She takes 3-6 pills daily and the amount she takes depends on availability and her finances. Pt last used yesterday. She reports hot/cold flashes, muscle cramps, and agitation. No alcohol use reported. No hx of seizures or blackouts. She denies SI, HI, and AVH's. She denies a associated history. Pt does admit to a history of depression and anxiety. She reports panic attacks 1 -2x's per week. She has poor appetite reporting she loss a lot of weight in the past several months. Patient denies sleeping stating, "I toss and turn at night". Patient denies previous inpatient hospitalizations. She does not have a out patient mental health provider.  Axis I: Opioid Dependence, Depressive Disorder Nos, and Anxiety Disorder Nos Axis II: Deferred Axis III:  Past Medical History  Diagnosis Date  . Chronic shoulder pain   . Anxiety    Axis IV: other psychosocial or environmental problems, problems related to social environment and problems with access to health care services Axis V: 31-40 impairment in reality testing  Past Medical History:  Past Medical History  Diagnosis Date  . Chronic shoulder pain   . Anxiety     Past Surgical History  Procedure Laterality Date  . Ankle surgery      Family History: History reviewed. No pertinent family history.  Social History:  reports that she has been smoking Cigarettes.  She has been smoking about 1.00 pack per day. She does not have any smokeless tobacco history on file. She reports that she uses illicit drugs (Marijuana). She reports that she does not drink alcohol.  Additional Social History:  Alcohol / Drug Use Pain Medications: SEE MAR Prescriptions: SEE MAR Over the Counter: SEE MAR History of alcohol / drug use?: Yes Substance #1 Name of Substance 1: Opiates-Roxicodone 1 - Age of First Use: 28  yrs old  1 - Amount (size/oz): 3-6 pills  1 - Frequency: daily  1 - Duration: 3 yrs daily  1 - Last Use / Amount: 03/15/2014; pt reports taking 3-4 pills  Substance #2 Name of Substance 2: Heroin  2 - Age of First Use: 28 yrs old  2 - Amount (size/oz): varies  2 - Frequency: 2-3x's use since age 22 2 - Duration: on-going  2 - Last Use / Amount: 6 months ago   CIWA: CIWA-Ar BP: 100/60 mmHg Pulse Rate: 58 COWS: Clinical Opiate Withdrawal Scale (COWS) Resting Pulse Rate: Pulse Rate 81-100 Sweating: Subjective report of chills or flushing Restlessness: Able to sit still Pupil Size: Pupils pinned or normal size for room light Bone or Joint Aches: Not present Runny Nose or Tearing: Not present GI Upset: nausea or loose stool Tremor: No tremor Yawning: No yawning Anxiety or Irritability: None Gooseflesh Skin: Skin is smooth COWS Total Score: 4  Allergies:  Allergies  Allergen Reactions  . Penicillins Hives and Other (See Comments)    headaches  . Tylenol [Acetaminophen]   . Venlafaxine Hives and Other (See Comments)    Headaches    . Morphine And Related Hives and Other (See Comments)    Reaction: headaches    Home Medications:  (Not in a hospital admission)  OB/GYN Status:  No LMP recorded. Patient is not currently having periods (Reason: IUD).  General Assessment Data [facility] of Assessment[facility] Is this a Tele or Face-to-Face Assessment?: Tele Assessment Is this an Initial  Assessment or a Re-assessment for this encounter?: Initial Assessment Living Arrangements: Parent;Other (Comment) (lives with mother ) Can pt return to current living arrangement?: Yes Admission Status: Voluntary Is patient capable of signing voluntary admission?: Yes Transfer from: Acute Hospital Referral Source: Self/Family/Friend  Medical Screening Exam  [facility] Medical Exam completed: No  [facility] Living Arrangements: Parent;Other (Comment) (lives with mother  ) Name of Psychiatrist:  (No psychiatrist ) Name of Therapist:  (No therapist )  Education Status Is patient currently in school?: No  Risk to self Suicidal Ideation: No Suicidal Intent: No Is patient at risk for suicide?: No Suicidal Plan?: No Access to Means: No What has been your use of drugs/alcohol within the last 12 months?:  (Opiate use only-Roxicodone) Previous Attempts/Gestures: No How many times?:  (n/a) Other Self Harm Risks:  (n/a) Triggers for Past Attempts: Other (Comment) (no previous attempts or gestures ) Intentional Self Injurious Behavior: None Family Suicide History: No Recent stressful life event(s): Other (Comment) (taking care of 2 kids; father of kids in prison) Persecutory voices/beliefs?: No Depression: Yes Depression Symptoms: Feeling angry/irritable;Loss of interest in usual pleasures;Feeling worthless/self pity;Guilt;Fatigue;Isolating;Tearfulness;Insomnia;Despondent Substance abuse history and/or treatment for substance abuse?: No Suicide prevention information given to non-admitted patients: Not applicable  Risk to Others Homicidal Ideation: No Thoughts of Harm to Others: No Current Homicidal Intent: No Current Homicidal Plan: No Access to Homicidal Means: No Identified Victim:  (n/a) History of harm to others?: No Assessment of Violence: None Noted Violent Behavior Description:  (patient is calm and cooperative ) Does patient have access to weapons?: No Criminal Charges Pending?: No Does patient have a court date: No  Psychosis Hallucinations: None noted Delusions: None noted  Mental Status Report Appear/Hygiene: Disheveled Eye Contact: Fair Motor Activity: Freedom of movement Speech: Logical/coherent Level of Consciousness: Alert Mood: Depressed Affect: Anxious Anxiety Level: Panic Attacks Panic attack frequency:  (1-2x's per week ) Thought Processes: Coherent Judgement: Impaired Orientation:  Person;Place;Time;Situation Obsessive Compulsive Thoughts/Behaviors: None  Cognitive Functioning Concentration: Decreased Memory: Recent Intact;Remote Intact IQ: Average Insight: Poor Impulse Control: Poor Appetite: Fair Weight Loss:  (none reported ) Weight Gain:  (none reported ) Sleep: Decreased Vegetative Symptoms: None  ADLScreening [facility] Assessment Services) Patient's cognitive ability adequate to safely complete daily activities?: Yes Patient able to express need for assistance with ADLs?: Yes Independently performs ADLs?: Yes (appropriate for developmental age)  Prior Inpatient Therapy Prior Inpatient Therapy: No Prior Therapy Dates:  (n/a) Prior Therapy Facilty/Provider(s):  (n/a) Reason for Treatment:  (n/a)  Prior Outpatient Therapy Prior Outpatient Therapy: No Prior Therapy Dates:  (n/a) Prior Therapy Facilty/Provider(s):  (n/a) Reason for Treatment:  (n/a)  ADL Screening (condition at time of admission) Patient's cognitive ability adequate to safely complete daily activities?: Yes Is the patient deaf or have difficulty hearing?: No Does the patient have difficulty seeing, even when wearing glasses/contacts?: No Does the patient have difficulty concentrating, remembering, or making decisions?: No Patient able to express need for assistance with ADLs?: Yes Does the patient have difficulty dressing or bathing?: No Independently performs ADLs?: Yes (appropriate for developmental age) Does the patient have difficulty walking or climbing stairs?: No Weakness of Legs: None Weakness of Arms/Hands: None  Home Assistive Devices/Equipment Home Assistive Devices/Equipment: None    Abuse/Neglect Assessment (Assessment to be complete while patient is alone) Physical Abuse: Denies Verbal Abuse: Denies Sexual Abuse: Denies Exploitation of patient/patient's resources: Denies Self-Neglect: Denies Values / Beliefs Cultural Requests During Hospitalization:  None Spiritual Requests During Hospitalization: None  Advance Directives (For Healthcare) Advance Directive: Patient does not have advance directive Nutrition Screen- [facility] Adult/[facility] Patient's home diet: Regular  Additional Information 1:1 In Past 12 Months?: No CIRT Risk: No Elopement Risk: No Does patient have medical clearance?: Yes     Disposition:  Disposition Initial Assessment Completed for this Encounter: Yes Disposition of Patient: Inpatient treatment program (Pending a outpatient referral to [facility]; if no beds d/c hm ) Type of inpatient treatment program: Adult (Pending [facility] referral )  Melynda [facility] 03/16/2014 10:19 AM

## 2014-03-16 NOTE — ED Notes (Signed)
Pt requesting ativan. Dr. Bebe ShaggyWickline aware and no new orders received. Pt sitting in bed watching tv talking on phone.

## 2014-03-17 LAB — URINE CULTURE

## 2014-04-04 ENCOUNTER — Encounter (HOSPITAL_COMMUNITY): Payer: Self-pay | Admitting: Emergency Medicine

## 2014-04-04 ENCOUNTER — Emergency Department (HOSPITAL_COMMUNITY)
Admission: EM | Admit: 2014-04-04 | Discharge: 2014-04-04 | Disposition: A | Discharge status: Home / Self Care | Payer: MEDICAID | Attending: Emergency Medicine | Admitting: Emergency Medicine

## 2014-04-04 DIAGNOSIS — G8929 Other chronic pain: Secondary | ICD-10-CM | POA: Diagnosis not present

## 2014-04-04 DIAGNOSIS — F1120 Opioid dependence, uncomplicated: Secondary | ICD-10-CM | POA: Insufficient documentation

## 2014-04-04 DIAGNOSIS — Z3202 Encounter for pregnancy test, result negative: Secondary | ICD-10-CM | POA: Insufficient documentation

## 2014-04-04 DIAGNOSIS — F411 Generalized anxiety disorder: Secondary | ICD-10-CM | POA: Insufficient documentation

## 2014-04-04 DIAGNOSIS — Z880 Allergy status to penicillin: Secondary | ICD-10-CM | POA: Diagnosis not present

## 2014-04-04 DIAGNOSIS — F17200 Nicotine dependence, unspecified, uncomplicated: Secondary | ICD-10-CM | POA: Insufficient documentation

## 2014-04-04 DIAGNOSIS — F1110 Opioid abuse, uncomplicated: Secondary | ICD-10-CM

## 2014-04-04 LAB — URINE MICROSCOPIC-ADD ON

## 2014-04-04 LAB — COMPREHENSIVE METABOLIC PANEL
ALK PHOS: 79 U/L (ref 39–117)
ALT: 15 U/L (ref 0–35)
AST: 13 U/L (ref 0–37)
Albumin: 3.8 g/dL (ref 3.5–5.2)
BUN: 7 mg/dL (ref 6–23)
CALCIUM: 9.5 mg/dL (ref 8.4–10.5)
CO2: 27 meq/L (ref 19–32)
Chloride: 103 mEq/L (ref 96–112)
Creatinine, Ser: 0.59 mg/dL (ref 0.50–1.10)
GFR calc non Af Amer: >90 mL/min (ref >90)
Glucose, Bld: 134 mg/dL — ABNORMAL HIGH (ref 70–99)
Potassium: 4 mEq/L (ref 3.7–5.3)
Sodium: 141 mEq/L (ref 137–147)
TOTAL PROTEIN: 7.6 g/dL (ref 6.0–8.3)
Total Bilirubin: 0.3 mg/dL (ref 0.3–1.2)

## 2014-04-04 LAB — CBC WITH DIFFERENTIAL/PLATELET
Basophils Absolute: 0 10*3/uL (ref 0.0–0.1)
Basophils Relative: 0 % (ref 0–1)
EOS ABS: 0 10*3/uL (ref 0.0–0.7)
EOS PCT: 0 % (ref 0–5)
HCT: 43.1 % (ref 36.0–46.0)
HEMOGLOBIN: 13.9 g/dL (ref 12.0–15.0)
LYMPHS ABS: 1.7 10*3/uL (ref 0.7–4.0)
Lymphocytes Relative: 18 % (ref 12–46)
MCH: 26.1 pg (ref 26.0–34.0)
MCHC: 32.3 g/dL (ref 30.0–36.0)
MCV: 80.9 fL (ref 78.0–100.0)
Monocytes Absolute: 0.6 10*3/uL (ref 0.1–1.0)
Monocytes Relative: 7 % (ref 3–12)
Neutro Abs: 7.1 10*3/uL (ref 1.7–7.7)
Neutrophils Relative %: 75 % (ref 43–77)
Platelets: 290 10*3/uL (ref 150–400)
RBC: 5.33 MIL/uL — AB (ref 3.87–5.11)
RDW: 14.1 % (ref 11.5–15.5)
WBC: 9.5 10*3/uL (ref 4.0–10.5)

## 2014-04-04 LAB — RAPID URINE DRUG SCREEN, HOSP PERFORMED
Amphetamines: NOT DETECTED
BARBITURATES: NOT DETECTED
Benzodiazepines: NOT DETECTED
Cocaine: NOT DETECTED
OPIATES: NOT DETECTED
TETRAHYDROCANNABINOL: POSITIVE — AB

## 2014-04-04 LAB — URINALYSIS, ROUTINE W REFLEX MICROSCOPIC
Bilirubin Urine: NEGATIVE
Glucose, UA: NEGATIVE mg/dL
Ketones, ur: NEGATIVE mg/dL
Leukocytes, UA: NEGATIVE
Nitrite: NEGATIVE
Protein, ur: NEGATIVE mg/dL
Specific Gravity, Urine: <1.005 — ABNORMAL LOW (ref 1.005–1.030)
UROBILINOGEN UA: 0.2 mg/dL (ref 0.0–1.0)
pH: 5.5 (ref 5.0–8.0)

## 2014-04-04 LAB — ETHANOL

## 2014-04-04 NOTE — Discharge Instructions (Signed)
GO STRAIGHT TO [facility] NOW!! THEY ARE EXPECTING YOU BY 9:30 TONIGHT.

## 2014-04-04 NOTE — ED Notes (Signed)
Spoke with Joni ReiningNicole at [facility].  Verified pt has been accepted and does have a bed waiting.  Joni Reiningicole requests pt be discharged so that family may transport her to [facility].

## 2014-04-04 NOTE — ED Notes (Signed)
Spoke with Joni ReiningNicole at [facility] and states, " pt urine drug screen was negative for opiates. I am not sure we will be able to get her authorization for detox since she has nothing in her system for us to detox"

## 2014-04-04 NOTE — ED Notes (Signed)
[facility]

## 2014-04-04 NOTE — ED Provider Notes (Signed)
CSN: [id number]     Arrival date & time 04/04/14  1337 History   First MD Initiated Contact with Patient 04/04/14 1507     No chief complaint on file.    (Consider location/radiation/quality/duration/timing/severity/associated sxs/prior Treatment) HPI Patient reports she has had a problem with opiate abuse. She states episode started 2 years ago. She's taking 30 mg of Roxicodone a day that she snorts. She states she last used yesterday. 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 She was sober for about a year. She denies feeling depressed, she denies suicidal or homicidal ideation although she is angry at her spouse however he's in prison. She states her spouse called social services because of her drug abuse and his concern for their children. She has a 26-year-old and 26-year-old home. She states she is here because of social services who said she had a problem. She states [facility]. She states they saw her today and told her she has been accepted at [facility]. Patient is here for her boyfriend who can transport her to get [facility].  GYN Dr Karmen BongoMc Cloud writes her klonopin  Past Medical History  Diagnosis Date  . Chronic shoulder pain   . Anxiety    Past Surgical History  Procedure Laterality Date  . Ankle surgery     History reviewed. No pertinent family history. History  Substance Use Topics  . Smoking status: Current Every Day Smoker -- 1.00 packs/day    Types: Cigarettes  . Smokeless tobacco: Not on file  . 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     Allergies  Penicillins; Tylenol; Venlafaxine; and Morphine and related  Home Medications   Prior to Admission  medications   Medication Sig Start Date End Date Taking? Authorizing Provider  clonazePAM (KLONOPIN) 1 MG tablet Take 1 mg by mouth 3 (three) times daily as needed. For anxiety   Yes Historical Provider, MD   BP 123/84  Pulse 81  Temp(Src) 97.9 F (36.6 C) (Oral)  Resp 18  Ht 5\' 7"  (1.702 m)  Wt 123 lb (55.792 kg)  BMI 19.26 kg/m2  SpO2 100%  Vital signs normal   Physical Exam  Nursing note and vitals reviewed. Constitutional: She is oriented to person, place, and time. She appears well-developed and well-nourished.  Non-toxic appearance. She does not appear ill. No distress.  HENT:  Head: Normocephalic and atraumatic.  Right Ear: External ear normal.  Left Ear: External ear normal.  Nose: Nose normal. No mucosal edema or rhinorrhea.  Mouth/Throat: Oropharynx is clear and moist and mucous membranes are normal. No dental abscesses or uvula swelling.  Eyes: Conjunctivae and EOM are normal. Pupils are equal, round, and reactive to light.  Neck: Normal range of motion and full passive range of motion without pain. Neck supple.  Cardiovascular: Normal rate, regular rhythm and normal heart sounds.  Exam reveals no gallop and no friction rub.   No murmur heard. Pulmonary/Chest: Effort normal and breath sounds normal. No respiratory distress. She has no wheezes. She has no rhonchi. She has no rales. She exhibits no  tenderness and no crepitus.  Abdominal: Soft. Normal appearance and bowel sounds are normal. She exhibits no distension. There is no tenderness. There is no rebound and no guarding.  Musculoskeletal: Normal range of motion. She exhibits no edema and no tenderness.  Moves all extremities well.   Neurological: She is alert and oriented to person, place, and time. She has normal strength. No cranial nerve deficit.  Skin: Skin is warm, dry and intact. No rash noted. No erythema. No pallor.  Psychiatric: She has a normal mood and affect. Her speech is normal and behavior is normal.  Her mood appears not anxious.    ED Course  Procedures (including critical care time)  Nursing staff called [facility]. They state they need to have copies of her lab work and ED notes faxed to them, however they should have a bed available.  [facility] has accepted patient, she is to go straight there, they are expecting her to be there in 1 hour  Labs Review Results for orders placed during the hospital encounter of 04/04/14  CBC WITH DIFFERENTIAL      Result Value Ref Range   WBC 9.5  4.0 - 10.5 K/uL   RBC 5.33 (*) 3.87 - 5.11 MIL/uL   Hemoglobin 13.9  12.0 - 15.0 g/dL   HCT 16.1  09.6 - 04.5 %   MCV 80.9  78.0 - 100.0 fL   MCH 26.1  26.0 - 34.0 pg   MCHC 32.3  30.0 - 36.0 g/dL   RDW 40.9  81.1 - 91.4 %   Platelets 290  150 - 400 K/uL   Neutrophils Relative % 75  43 - 77 %   Neutro Abs 7.1  1.7 - 7.7 K/uL   Lymphocytes Relative 18  12 - 46 %   Lymphs Abs 1.7  0.7 - 4.0 K/uL   Monocytes Relative 7  3 - 12 %   Monocytes Absolute 0.6  0.1 - 1.0 K/uL   Eosinophils Relative 0  0 - 5 %   Eosinophils Absolute 0.0  0.0 - 0.7 K/uL   Basophils Relative 0  0 - 1 %   Basophils Absolute 0.0  0.0 - 0.1 K/uL  COMPREHENSIVE METABOLIC PANEL      Result Value Ref Range   Sodium 141  137 - 147 mEq/L   Potassium 4.0  3.7 - 5.3 mEq/L   Chloride 103  96 - 112 mEq/L   CO2 27  19 - 32 mEq/L   Glucose, Bld 134 (*) 70 - 99 mg/dL   BUN 7  6 - 23 mg/dL   Creatinine, Ser 7.82  0.50 - 1.10 mg/dL   Calcium 9.5  8.4 - 95.6 mg/dL   Total Protein 7.6  6.0 - 8.3 g/dL   Albumin 3.8  3.5 - 5.2 g/dL   AST 13  0 - 37 U/L   ALT 15  0 - 35 U/L   Alkaline Phosphatase 79  39 - 117 U/L   Total Bilirubin 0.3  0.3 - 1.2 mg/dL   GFR calc non Af Amer >90  >90 mL/min   GFR calc Af Amer >90  >90 mL/min  ETHANOL      Result Value Ref Range   Alcohol, Ethyl (B) <11  0 - 11 mg/dL  URINALYSIS, ROUTINE W REFLEX MICROSCOPIC      Result Value Ref Range   Color, Urine YELLOW  YELLOW   APPearance CLEAR  CLEAR   Specific  Gravity, Urine <1.005 (*) 1.005 -  1.030   pH 5.5  5.0 - 8.0   Glucose, UA NEGATIVE  NEGATIVE mg/dL   Hgb urine dipstick TRACE (*) NEGATIVE   Bilirubin Urine NEGATIVE  NEGATIVE   Ketones, ur NEGATIVE  NEGATIVE mg/dL   Protein, ur NEGATIVE  NEGATIVE mg/dL   Urobilinogen, UA 0.2  0.0 - 1.0 mg/dL   Nitrite NEGATIVE  NEGATIVE   Leukocytes, UA NEGATIVE  NEGATIVE  URINE RAPID DRUG SCREEN (HOSP PERFORMED)      Result Value Ref Range   Opiates NONE DETECTED  NONE DETECTED   Cocaine NONE DETECTED  NONE DETECTED   Benzodiazepines NONE DETECTED  NONE DETECTED   Amphetamines NONE DETECTED  NONE DETECTED   Tetrahydrocannabinol POSITIVE (*) NONE DETECTED   Barbiturates NONE DETECTED  NONE DETECTED  URINE MICROSCOPIC-ADD ON      Result Value Ref Range   Squamous Epithelial / LPF MANY (*) RARE   WBC, UA 0-2  <3 WBC/hpf   RBC / HPF 0-2  <3 RBC/hpf   Bacteria, UA RARE  RARE   Laboratory interpretation all normal except positive UDS     Imaging Review No results found.   EKG Interpretation None      MDM   Final diagnoses:  Opiate abuse, continuous    Plan discharge  Devoria AlbeIva Sameer Teeple, MD, [facility] DellFACEP    Quatavious Rossa L Ady Heimann, MD 04/05/14 [phone]

## 2014-04-04 NOTE — ED Notes (Signed)
Report given to J. Armstrong RCharity fundraiser

## 2014-04-05 LAB — POC URINE PREG, ED: Preg Test, Ur: NEGATIVE

## 2014-06-01 ENCOUNTER — Encounter (HOSPITAL_COMMUNITY): Payer: Self-pay | Admitting: Emergency Medicine

## 2014-06-01 ENCOUNTER — Emergency Department (HOSPITAL_COMMUNITY)
Admission: EM | Admit: 2014-06-01 | Discharge: 2014-06-01 | Disposition: A | Discharge status: Home / Self Care | Payer: Medicaid Other | Attending: Emergency Medicine | Admitting: Emergency Medicine

## 2014-06-01 DIAGNOSIS — F411 Generalized anxiety disorder: Secondary | ICD-10-CM | POA: Insufficient documentation

## 2014-06-01 DIAGNOSIS — Z9114 Patient's other noncompliance with medication regimen: Secondary | ICD-10-CM

## 2014-06-01 DIAGNOSIS — F17200 Nicotine dependence, unspecified, uncomplicated: Secondary | ICD-10-CM | POA: Insufficient documentation

## 2014-06-01 DIAGNOSIS — Z880 Allergy status to penicillin: Secondary | ICD-10-CM | POA: Insufficient documentation

## 2014-06-01 DIAGNOSIS — Z9119 Patient's noncompliance with other medical treatment and regimen: Secondary | ICD-10-CM | POA: Insufficient documentation

## 2014-06-01 DIAGNOSIS — Z79899 Other long term (current) drug therapy: Secondary | ICD-10-CM | POA: Insufficient documentation

## 2014-06-01 DIAGNOSIS — R112 Nausea with vomiting, unspecified: Secondary | ICD-10-CM | POA: Insufficient documentation

## 2014-06-01 DIAGNOSIS — R109 Unspecified abdominal pain: Secondary | ICD-10-CM | POA: Insufficient documentation

## 2014-06-01 DIAGNOSIS — G8929 Other chronic pain: Secondary | ICD-10-CM | POA: Insufficient documentation

## 2014-06-01 DIAGNOSIS — Z91199 Patient's noncompliance with other medical treatment and regimen due to unspecified reason: Secondary | ICD-10-CM | POA: Insufficient documentation

## 2014-06-01 MED ORDER — PROMETHAZINE HCL 25 MG RE SUPP: 25.0000 mg | Freq: Four times a day (QID) | RECTAL | Status: DC | PRN

## 2014-06-01 MED ORDER — OXYCODONE HCL 5 MG PO TABS: 5.0000 mg | ORAL_TABLET | Freq: Four times a day (QID) | ORAL | Status: DC | PRN

## 2014-06-01 MED ORDER — FAMOTIDINE 20 MG PO TABS
20.0000 mg | ORAL_TABLET | Freq: Once | ORAL | Status: AC
  Administered 2014-06-01: 20 mg via ORAL
  Filled 2014-06-01: qty 1

## 2014-06-01 MED ORDER — OXYCODONE HCL 5 MG PO TABS
5.0000 mg | ORAL_TABLET | Freq: Once | ORAL | Status: AC
  Administered 2014-06-01: 5 mg via ORAL
  Filled 2014-06-01: qty 1

## 2014-06-01 MED ORDER — PROMETHAZINE HCL 25 MG/ML IJ SOLN
25.0000 mg | Freq: Once | INTRAMUSCULAR | Status: AC
  Administered 2014-06-01: 25 mg via INTRAMUSCULAR
  Filled 2014-06-01: qty 1

## 2014-06-01 NOTE — Discharge Instructions (Signed)
Please use promethazine every 6 hours rectally for nausea vomiting. Please continue your Klonopin until you're methadone is restarted.  Nausea and Vomiting Nausea means you feel sick to your stomach. Throwing up (vomiting) is a reflex where stomach contents come out of your mouth. HOME CARE   Take medicine as told by your doctor.  Do not force yourself to eat. However, you do need to drink fluids.  If you feel like eating, eat a normal diet as told by your doctor.  Eat rice, wheat, potatoes, bread, lean meats, yogurt, fruits, and vegetables.  Avoid high-fat foods.  Drink enough fluids to keep your pee (urine) clear or pale yellow.  Ask your doctor how to replace body fluid losses (rehydrate). Signs of body fluid loss (dehydration) include:  Feeling very thirsty.  Dry lips and mouth.  Feeling dizzy.  Dark pee.  Peeing less than normal.  Feeling confused.  Fast breathing or heart rate. GET HELP RIGHT AWAY IF:   You have blood in your throw up.  You have black or bloody poop (stool).  You have a bad headache or stiff neck.  You feel confused.  You have bad belly (abdominal) pain.  You have chest pain or trouble breathing.  You do not pee at least once every 8 hours.  You have cold, clammy skin.  You keep throwing up after 24 to 48 hours.  You have a fever. MAKE SURE YOU:   Understand these instructions.  Will watch your condition.  Will get help right away if you are not doing well or get worse. Document Released: 05/20/2008 Document Revised: 02/24/2012 Document Reviewed: 05/03/2011 [facility] Patient Information 2015 [place], [place]. This information is not intended to replace advice given to you by your health care provider. Make sure you discuss any questions you have with your health care provider.

## 2014-06-01 NOTE — ED Notes (Signed)
Vomiting with abd pain x 2 days.  Denies diarrhea.  Reports has not been to methadone clinic x 2 days and c/o back pain.

## 2014-06-01 NOTE — ED Provider Notes (Signed)
CSN: [id number]     Arrival date & time 06/01/14  1119 History   First MD Initiated Contact with Patient 06/01/14 1136     Chief Complaint  Patient presents with  . Emesis     (Consider location/radiation/quality/duration/timing/severity/associated sxs/prior Treatment) HPI Comments: Patient is a 28 year old female who presents to the emergency department with complaint of vomiting and some abdominal pain for about 2 or 3 days. The patient states she thinks that she may have picked up a stomach virus. She has not had any diarrhea but states she's been having some vomiting of stomach contents over the last 2 or 3 days. It is also of note that the patient attends a methadone clinic, and she has not had her methadone for the past 2 days. The patient states however that she felt sick before she missed her doses of methadone. She has not had any high fever, there's been no injury or trauma to the abdomen, and there's been no previous operations or procedures involving the abdomen.  Patient is a 28 y.o. female presenting with vomiting. The history is provided by the patient.  Emesis Associated symptoms: abdominal pain and arthralgias   Associated symptoms: no diarrhea     Past Medical History  Diagnosis Date  . Chronic shoulder pain   . Anxiety    Past Surgical History  Procedure Laterality Date  . Ankle surgery     No family history on file. History  Substance Use Topics  . Smoking status: Current Every Day Smoker -- 1.00 packs/day    Types: Cigarettes  . Smokeless tobacco: Not on file  . Alcohol Use: No   OB History   Grav Para Term Preterm Abortions TAB SAB Ect Mult Living                 Review of Systems  Constitutional: Negative for activity change.       All ROS Neg except as noted in HPI  HENT: Negative for nosebleeds.   Eyes: Negative for photophobia and discharge.  Respiratory: Negative for cough, shortness of breath and wheezing.   Cardiovascular: Negative for chest  pain and palpitations.  Gastrointestinal: Positive for nausea, vomiting and abdominal pain. Negative for diarrhea and blood in stool.  Genitourinary: Negative for dysuria, frequency and hematuria.  Musculoskeletal: Positive for arthralgias. Negative for back pain and neck pain.  Skin: Negative.   Neurological: Negative for dizziness, seizures and speech difficulty.  Psychiatric/Behavioral: Negative for hallucinations and confusion.      Allergies  Penicillins; Tylenol; Venlafaxine; and Morphine and related  Home Medications   Prior to Admission medications   Medication Sig Start Date End Date Taking? Authorizing Provider  clonazePAM (KLONOPIN) 1 MG tablet Take 1 mg by mouth 3 (three) times daily as needed. For anxiety   Yes Historical Provider, MD   BP 112/64  Pulse 80  Temp(Src) 97.9 F (36.6 C) (Oral)  Resp 16  Ht 5\' 7"  (1.702 m)  Wt 130 lb (58.968 kg)  BMI 20.36 kg/m2  SpO2 98% Physical Exam  Nursing note and vitals reviewed. Constitutional: She is oriented to person, place, and time. She appears well-developed and well-nourished.  Non-toxic appearance.  HENT:  Head: Normocephalic.  Right Ear: Tympanic membrane and external ear normal.  Left Ear: Tympanic membrane and external ear normal.  Eyes: EOM and lids are normal. Pupils are equal, round, and reactive to light.  Neck: Normal range of motion. Neck supple. Carotid bruit is not present.  Cardiovascular: Normal rate,  regular rhythm, normal heart sounds, intact distal pulses and normal pulses.   Pulmonary/Chest: Breath sounds normal. No respiratory distress.  Abdominal: Soft. Bowel sounds are normal. She exhibits no mass. There is no tenderness. There is no guarding.  Musculoskeletal: Normal range of motion.  Lymphadenopathy:       Head (right side): No submandibular adenopathy present.       Head (left side): No submandibular adenopathy present.    She has no cervical adenopathy.  Neurological: She is alert and  oriented to person, place, and time. She has normal strength. No cranial nerve deficit or sensory deficit.  Skin: Skin is warm and dry.  Psychiatric: She has a normal mood and affect. Her speech is normal.    ED Course  Procedures (including critical care time) Labs Review Labs Reviewed - No data to display  Imaging Review No results found.   EKG Interpretation None      MDM The vital signs are well within normal limits. The pulse oximetry is 98% on room air. The abdomen is soft with good bowel sounds. Patient does not appear to in any acute distress. The patient was treated in the emergency department with promethazine. The patient has not had any additional vomiting since the promethazine was given. Patient did request medication for headache which was given and the patient's feeling better after that. I feel that it is safe for the patient to return home. I have strongly encouraged patient to get her methadone, or see her physician who is initially methadone as sone as possible. Patient will be discharged home with promethazine one every 6 hours #12.    Final diagnoses:  None    *I have reviewed nursing notes, vital signs, and all appropriate lab and imaging results for this patient.Kathie Dike*    Hobson M Bryant, PA-C 06/01/14 1416

## 2014-06-01 NOTE — Care Management Note (Signed)
ED/CM noted patient did not have health insurance and/or PCP listed in the computer.  Patient was given the [place] resource handout with information on the clinics, food pantries, and the handout for new health insurance sign-up.  Patient expressed appreciation for information received. 

## 2014-06-01 NOTE — ED Notes (Signed)
Drank sprite without difficulty.  States her stomach feels better.

## 2014-06-02 NOTE — ED Provider Notes (Signed)
Medical screening examination/treatment/procedure(s) were performed by non-physician practitioner and as supervising physician I was immediately available for consultation/collaboration.   Toy BakerAnthony T Allen, MD 06/02/14 [phone]

## 2014-06-03 ENCOUNTER — Encounter (HOSPITAL_COMMUNITY): Payer: Self-pay | Admitting: Emergency Medicine

## 2014-06-03 ENCOUNTER — Inpatient Hospital Stay (HOSPITAL_COMMUNITY)
Admission: EM | Admit: 2014-06-03 | Discharge: 2014-06-07 | DRG: 442 | Disposition: A | Discharge status: Home / Self Care | Payer: Medicaid Other | Attending: Internal Medicine | Admitting: Internal Medicine

## 2014-06-03 ENCOUNTER — Emergency Department (HOSPITAL_COMMUNITY): Payer: Medicaid Other

## 2014-06-03 DIAGNOSIS — K759 Inflammatory liver disease, unspecified: Secondary | ICD-10-CM

## 2014-06-03 DIAGNOSIS — N3000 Acute cystitis without hematuria: Secondary | ICD-10-CM | POA: Diagnosis present

## 2014-06-03 DIAGNOSIS — F1910 Other psychoactive substance abuse, uncomplicated: Secondary | ICD-10-CM

## 2014-06-03 DIAGNOSIS — S93409A Sprain of unspecified ligament of unspecified ankle, initial encounter: Secondary | ICD-10-CM

## 2014-06-03 DIAGNOSIS — R17 Unspecified jaundice: Secondary | ICD-10-CM

## 2014-06-03 DIAGNOSIS — F411 Generalized anxiety disorder: Secondary | ICD-10-CM | POA: Diagnosis present

## 2014-06-03 DIAGNOSIS — B179 Acute viral hepatitis, unspecified: Secondary | ICD-10-CM

## 2014-06-03 DIAGNOSIS — R8290 Unspecified abnormal findings in urine: Secondary | ICD-10-CM

## 2014-06-03 DIAGNOSIS — G894 Chronic pain syndrome: Secondary | ICD-10-CM

## 2014-06-03 DIAGNOSIS — K7200 Acute and subacute hepatic failure without coma: Secondary | ICD-10-CM

## 2014-06-03 DIAGNOSIS — Z720 Tobacco use: Secondary | ICD-10-CM

## 2014-06-03 DIAGNOSIS — E876 Hypokalemia: Secondary | ICD-10-CM

## 2014-06-03 DIAGNOSIS — F17200 Nicotine dependence, unspecified, uncomplicated: Secondary | ICD-10-CM | POA: Diagnosis present

## 2014-06-03 DIAGNOSIS — R82998 Other abnormal findings in urine: Secondary | ICD-10-CM

## 2014-06-03 DIAGNOSIS — N390 Urinary tract infection, site not specified: Secondary | ICD-10-CM

## 2014-06-03 DIAGNOSIS — F1210 Cannabis abuse, uncomplicated: Secondary | ICD-10-CM | POA: Diagnosis present

## 2014-06-03 DIAGNOSIS — B1710 Acute hepatitis C without hepatic coma: Principal | ICD-10-CM

## 2014-06-03 LAB — URINALYSIS, ROUTINE W REFLEX MICROSCOPIC
Glucose, UA: 100 mg/dL — AB
Ketones, ur: NEGATIVE mg/dL
NITRITE: POSITIVE — AB
PH: 5.5 (ref 5.0–8.0)
Protein, ur: 30 mg/dL — AB
UROBILINOGEN UA: 2 mg/dL — AB (ref 0.0–1.0)

## 2014-06-03 LAB — URINE MICROSCOPIC-ADD ON

## 2014-06-03 LAB — COMPREHENSIVE METABOLIC PANEL
ALK PHOS: 236 U/L — AB (ref 39–117)
ALT: 3295 U/L — AB (ref 0–35)
AST: 1398 U/L — AB (ref 0–37)
Albumin: 3.4 g/dL — ABNORMAL LOW (ref 3.5–5.2)
BUN: 6 mg/dL (ref 6–23)
CALCIUM: 8.7 mg/dL (ref 8.4–10.5)
CO2: 24 mEq/L (ref 19–32)
Chloride: 97 mEq/L (ref 96–112)
Creatinine, Ser: 0.56 mg/dL (ref 0.50–1.10)
GFR calc non Af Amer: >90 mL/min (ref >90)
GLUCOSE: 117 mg/dL — AB (ref 70–99)
POTASSIUM: 3.1 meq/L — AB (ref 3.7–5.3)
SODIUM: 137 meq/L (ref 137–147)
Total Bilirubin: 11.5 mg/dL — ABNORMAL HIGH (ref 0.3–1.2)
Total Protein: 6.6 g/dL (ref 6.0–8.3)

## 2014-06-03 LAB — CBC WITH DIFFERENTIAL/PLATELET
Basophils Absolute: 0 10*3/uL (ref 0.0–0.1)
Basophils Relative: 0 % (ref 0–1)
EOS ABS: 0.1 10*3/uL (ref 0.0–0.7)
Eosinophils Relative: 1 % (ref 0–5)
HCT: 41.4 % (ref 36.0–46.0)
HEMOGLOBIN: 14.4 g/dL (ref 12.0–15.0)
Lymphocytes Relative: 21 % (ref 12–46)
Lymphs Abs: 1.8 10*3/uL (ref 0.7–4.0)
MCH: 27.2 pg (ref 26.0–34.0)
MCHC: 34.8 g/dL (ref 30.0–36.0)
MCV: 78.3 fL (ref 78.0–100.0)
MONOS PCT: 9 % (ref 3–12)
Monocytes Absolute: 0.8 10*3/uL (ref 0.1–1.0)
NEUTROS ABS: 5.8 10*3/uL (ref 1.7–7.7)
NEUTROS PCT: 69 % (ref 43–77)
PLATELETS: 255 10*3/uL (ref 150–400)
RBC: 5.29 MIL/uL — AB (ref 3.87–5.11)
RDW: 15 % (ref 11.5–15.5)
WBC: 8.5 10*3/uL (ref 4.0–10.5)

## 2014-06-03 LAB — RAPID URINE DRUG SCREEN, HOSP PERFORMED
Amphetamines: NOT DETECTED
BARBITURATES: NOT DETECTED
Benzodiazepines: NOT DETECTED
Cocaine: NOT DETECTED
Opiates: NOT DETECTED
Tetrahydrocannabinol: POSITIVE — AB

## 2014-06-03 LAB — LIPASE, BLOOD: LIPASE: 25 U/L (ref 11–59)

## 2014-06-03 LAB — PREGNANCY, URINE: Preg Test, Ur: NEGATIVE

## 2014-06-03 MED ORDER — CIPROFLOXACIN IN D5W 400 MG/200ML IV SOLN
400.0000 mg | Freq: Once | INTRAVENOUS | Status: AC
  Administered 2014-06-03: 400 mg via INTRAVENOUS
  Filled 2014-06-03: qty 200

## 2014-06-03 MED ORDER — SODIUM CHLORIDE 0.9 % IV BOLUS (SEPSIS)
1000.0000 mL | Freq: Once | INTRAVENOUS | Status: AC
  Administered 2014-06-03: 1000 mL via INTRAVENOUS

## 2014-06-03 MED ORDER — IOHEXOL 300 MG/ML  SOLN: 100.0000 mL | Freq: Once | INTRAMUSCULAR | Status: AC | PRN

## 2014-06-03 MED ORDER — IOHEXOL 300 MG/ML  SOLN: 50.0000 mL | Freq: Once | INTRAMUSCULAR | Status: AC | PRN

## 2014-06-03 MED ORDER — ONDANSETRON HCL 4 MG/2ML IJ SOLN
4.0000 mg | Freq: Once | INTRAMUSCULAR | Status: AC
Start: 2014-06-03 — End: 2014-06-03
  Administered 2014-06-03: 4 mg via INTRAVENOUS
  Filled 2014-06-03: qty 2

## 2014-06-03 NOTE — ED Notes (Signed)
Pt. Reports fatigue, nausea, and back pain starting Sunday. Pt. Reports 2 episodes of vomiting today. Pt. Reports that she noticed eyes turning yellow yesterday.

## 2014-06-03 NOTE — ED Provider Notes (Addendum)
CSN: [id number]     Arrival date & time 06/03/14  2205 History   First MD Initiated Contact with Patient 06/03/14 2231    This chart was scribed for Benny LennertJoseph L Zammit, MD by Marica OtterNusrat Rahman, ED Scribe. This patient was seen in room APA05/[facility]05 and the patient's care was started at 10:35 PM.  Chief Complaint  Patient presents with  . Fatigue  . Back Pain   Patient is a 28 y.o. female presenting with abdominal pain. The history is provided by the patient. No language interpreter was used.  Abdominal Pain Pain location:  Generalized Pain quality: aching   Pain radiates to:  Back Pain severity:  Moderate Onset quality:  Gradual Duration:  5 days Timing:  Intermittent Progression:  Worsening Chronicity:  New Context: not awakening from sleep   Associated symptoms: fatigue, nausea and vomiting   Associated symptoms: no cough, no diarrhea, no fever and no hematuria   Nausea:    Severity:  Moderate   Duration:  5 days   Timing:  Intermittent   Progression:  Worsening Vomiting:    Number of occurrences:  2 today   Duration:  5 days   Timing:  Intermittent   Progression:  Worsening  HPI Comments: Heidi [name] is a 28 y.o. female who presents to the Emergency Department complaining of worsening abd pain with associated nausea and vomiting onset 5 days ago. Pt reports 2 episodes of emesis today; pt also notes that she noticed her eyes turning yellow yesterday. Pt denies diarrhea.  Pt reports she presented to, and was treated, at the ED for the same 2 days ago. Pt denies having a PCP.   Past Medical History  Diagnosis Date  . Chronic shoulder pain   . Anxiety    Past Surgical History  Procedure Laterality Date  . Ankle surgery     No family history on file. History  Substance Use Topics  . Smoking status: Current Every Day Smoker -- 1.00 packs/day    Types: Cigarettes  . Smokeless tobacco: Not on file  . Alcohol Use: Yes   OB History   Grav Para Term Preterm Abortions TAB SAB Ect  Mult Living                 Review of Systems  Constitutional: Positive for fatigue. Negative for fever and appetite change.  HENT: Negative for congestion, ear discharge and sinus pressure.   Eyes:       Eye turning yellow  Respiratory: Negative for cough.   Cardiovascular: Negative for leg swelling.  Gastrointestinal: Positive for nausea, vomiting and abdominal pain. Negative for diarrhea.  Genitourinary: Negative for frequency and hematuria.  Musculoskeletal: Positive for back pain.  Skin: Negative for rash.  Neurological: Negative for seizures.  Psychiatric/Behavioral: Negative for hallucinations.      Allergies  Penicillins; Tylenol; Venlafaxine; and Morphine and related  Home Medications   Prior to Admission medications   Medication Sig Start Date End Date Taking? Authorizing Provider  clonazePAM (KLONOPIN) 1 MG tablet Take 1 mg by mouth 3 (three) times daily as needed. For anxiety    Historical Provider, MD  oxyCODONE (ROXICODONE) 5 MG immediate release tablet Take 1 tablet (5 mg total) by mouth every 6 (six) hours as needed for severe pain. 06/01/14   Kathie DikeHobson M Bryant, PA-C  promethazine (PHENERGAN) 25 MG suppository Place 1 suppository (25 mg total) rectally every 6 (six) hours as needed for nausea or vomiting. 06/01/14   Kathie DikeHobson M Bryant, PA-C  Triage Vitals: BP 108/69  Pulse 103  Temp(Src) 98.9 F (37.2 C)  Resp 20  Ht 5\' 7"  (1.702 m)  Wt 132 lb 5 oz (60.017 kg)  BMI 20.72 kg/m2  SpO2 99% Physical Exam  Constitutional: She is oriented to person, place, and time. She appears well-developed.  HENT:  Head: Normocephalic.  Eyes: EOM are normal. Scleral icterus is present.  Neck: Neck supple. No thyromegaly present.  Cardiovascular: Normal rate and regular rhythm.  Exam reveals no gallop and no friction rub.   No murmur heard. Pulmonary/Chest: No stridor. She has no wheezes. She has no rales. She exhibits no tenderness.  Abdominal: She exhibits no distension.  There is tenderness (minimal tenderness throughout abd). There is no rebound.  Musculoskeletal: Normal range of motion. She exhibits no edema.  Lymphadenopathy:    She has no cervical adenopathy.  Neurological: She is oriented to person, place, and time. She exhibits normal muscle tone. Coordination normal.  Skin: No rash noted. No erythema.  Psychiatric: She has a normal mood and affect. Her behavior is normal.    ED Course  Procedures (including critical care time) DIAGNOSTIC STUDIES: Oxygen Saturation is 99% on RA, normal by my interpretation.    COORDINATION OF CARE: 10:38 PM-Discussed treatment plan which includes meds and labs with pt at bedside and pt agreed to plan.    Labs Review Labs Reviewed  URINE RAPID DRUG SCREEN (HOSP PERFORMED)  URINALYSIS, ROUTINE W REFLEX MICROSCOPIC  CBC WITH DIFFERENTIAL  LIPASE, BLOOD  COMPREHENSIVE METABOLIC PANEL    Imaging Review No results found.   EKG Interpretation None      MDM   Final diagnoses:  None   The chart was scribed for me under my direct supervision.  I personally performed the history, physical, and medical decision making and all procedures in the evaluation of this patient.Benny Lennert.   Joseph L Zammit, MD 06/03/14 2316  Benny LennertJoseph L Zammit, MD 06/03/14 [phone]

## 2014-06-03 NOTE — ED Notes (Signed)
Pt. Reports drinking "a few drinks tonight" and using mariajuana.

## 2014-06-03 NOTE — H&P (Signed)
[facility] History and Physical  Heidi [name] RUE:[id number] DOB: 29-Jan-1986 DOA: 06/03/2014   PCP: She does not have a primary care physician Specialists: None  Chief Complaint: Yellow eyes  HPI: Heidi [name] is a 28 y.o. female with a past medical history of chronic pain in her right shoulder and upper back, anxiety disorder, who was in her usual state of health till about 5 days ago, when she started having nausea and had a few episodes of vomiting. She felt fatigued. She denies any blood in the emesis. She hasn't had a bowel movement in a week. She also started having pain in her upper abdomen on and off, sharp pain, 8/10 in intensity without any radiation. She takes Klonopin, oxycodone, and has taken Phenergan suppositories recently for her nausea. Denies taking Tylenol. Rarely drinks alcohol. She admits to IV drug use about 6 months ago, but nothing recently. She does have tattoos on her body done over the last year or so. Denies any recent travel. Denies any sick contacts. She actually presented to the emergency department 2 days ago, with similar complaints. She was treated symptomatically and was discharged home. Her symptoms persisted, and then her eyes turned yellow yesterday and so, she decided to come in to the hospital. She's not a very good historian. Patient kept texiting on her phone while I was talking to her.  Home Medications: Prior to Admission medications   Medication Sig Start Date End Date Taking? Authorizing Provider  clonazePAM (KLONOPIN) 1 MG tablet Take 1 mg by mouth 3 (three) times daily as needed. For anxiety   Yes Historical Provider, MD  oxyCODONE (ROXICODONE) 5 MG immediate release tablet Take 1 tablet (5 mg total) by mouth every 6 (six) hours as needed for severe pain. 06/01/14  Yes Lenox Ahr, PA-C  promethazine (PHENERGAN) 25 MG suppository Place 1 suppository (25 mg total) rectally every 6 (six) hours as needed for nausea or vomiting. 06/01/14  Yes  Lenox Ahr, PA-C    Allergies:  Allergies  Allergen Reactions  . Penicillins Hives and Other (See Comments)    headaches  . Tylenol [Acetaminophen]   . Venlafaxine Hives and Other (See Comments)    Headaches    . Morphine And Related Hives and Other (See Comments)    Reaction: headaches    Past Medical History: Past Medical History  Diagnosis Date  . Chronic shoulder pain   . Anxiety     Past Surgical History  Procedure Laterality Date  . Ankle surgery      Social History: She lives and weeks ago. Smoked one pack of cigarettes on a daily basis. Very rare alcohol use. Admits to marijuana use frequently. Did IV drug use. A few months ago, but none recently.  Family History:  Family History  Problem Relation Age of Onset  . Cirrhosis Father   . Hepatitis C Father      Review of Systems - History obtained from the patient General ROS: positive for  - fatigue Psychological ROS: negative Ophthalmic ROS: negative ENT ROS: negative Allergy and Immunology ROS: negative Hematological and Lymphatic ROS: negative Endocrine ROS: negative Respiratory ROS: no cough, shortness of breath, or wheezing Cardiovascular ROS: negative Gastrointestinal ROS: as in hpi Genito-Urinary ROS: no dysuria, trouble voiding, or hematuria Musculoskeletal ROS: chronic pain in shoulder and upper back Neurological ROS: no TIA or stroke symptoms Dermatological ROS: negative  Physical Examination  Filed Vitals:   06/03/14 2211  BP: 108/69  Pulse: 103  Temp: 98.9  F (37.2 C)  Resp: 20  Height: 5' 7"  (1.702 m)  Weight: 60.017 kg (132 lb 5 oz)  SpO2: 99%    BP 108/69  Pulse 103  Temp(Src) 98.9 F (37.2 C)  Resp 20  Ht 5' 7"  (1.702 m)  Wt 60.017 kg (132 lb 5 oz)  BMI 20.72 kg/m2  SpO2 99%  General appearance: alert, cooperative, appears stated age and no distress Head: Normocephalic, without obvious abnormality, atraumatic Eyes: She has icteric sclera. Extraocular movements  are intact. Throat: lips, mucosa, and tongue normal; teeth and gums normal Neck: no adenopathy, no carotid bruit, no JVD, supple, symmetrical, trachea midline and thyroid not enlarged, symmetric, no tenderness/mass/nodules Resp: clear to auscultation bilaterally Cardio: regular rate and rhythm, S1, S2 normal, no murmur, click, rub or gallop GI: Is soft. There is some mild tenderness in the upper abdomen without any rebound, rigidity, or guarding. No Murphy's sign. No hepatomegaly. No other organomegaly. Bowel sounds present. No mother masses. Extremities: extremities normal, atraumatic, no cyanosis or edema Pulses: 2+ and symmetric Skin: Jaundiced appearing Lymph nodes: Cervical, supraclavicular, and axillary nodes normal. Neurologic: Alert and oriented x3. No focal neurological deficits are present.  Laboratory Data: Results for orders placed during the hospital encounter of 06/03/14 (from the past 48 hour(s))  URINE RAPID DRUG SCREEN (HOSP PERFORMED)     Status: Abnormal   Collection Time    06/03/14 10:17 PM      Result Value Ref Range   Opiates NONE DETECTED  NONE DETECTED   Cocaine NONE DETECTED  NONE DETECTED   Benzodiazepines NONE DETECTED  NONE DETECTED   Amphetamines NONE DETECTED  NONE DETECTED   Tetrahydrocannabinol POSITIVE (*) NONE DETECTED   Barbiturates NONE DETECTED  NONE DETECTED   Comment:            DRUG SCREEN FOR MEDICAL PURPOSES     ONLY.  IF CONFIRMATION IS NEEDED     FOR ANY PURPOSE, NOTIFY LAB     WITHIN 5 DAYS.                LOWEST DETECTABLE LIMITS     FOR URINE DRUG SCREEN     Drug Class       Cutoff (ng/mL)     Amphetamine      1000     Barbiturate      200     Benzodiazepine   235     Tricyclics       573     Opiates          300     Cocaine          300     THC              50  URINALYSIS, ROUTINE W REFLEX MICROSCOPIC     Status: Abnormal   Collection Time    06/03/14 10:17 PM      Result Value Ref Range   Color, Urine ORANGE (*) YELLOW    Comment: BIOCHEMICALS MAY BE AFFECTED BY COLOR   APPearance HAZY (*) CLEAR   Specific Gravity, Urine <1.005 (*) 1.005 - 1.030   pH 5.5  5.0 - 8.0   Glucose, UA 100 (*) NEGATIVE mg/dL   Hgb urine dipstick SMALL (*) NEGATIVE   Bilirubin Urine MODERATE (*) NEGATIVE   Ketones, ur NEGATIVE  NEGATIVE mg/dL   Protein, ur 30 (*) NEGATIVE mg/dL   Urobilinogen, UA 2.0 (*) 0.0 - 1.0 mg/dL   Nitrite POSITIVE (*)  NEGATIVE   Leukocytes, UA MODERATE (*) NEGATIVE  URINE MICROSCOPIC-ADD ON     Status: Abnormal   Collection Time    06/03/14 10:17 PM      Result Value Ref Range   Squamous Epithelial / LPF FEW (*) RARE   WBC, UA 21-50  <3 WBC/hpf   RBC / HPF 0-2  <3 RBC/hpf   Bacteria, UA FEW (*) RARE  CBC WITH DIFFERENTIAL     Status: Abnormal   Collection Time    06/03/14 10:23 PM      Result Value Ref Range   WBC 8.5  4.0 - 10.5 K/uL   RBC 5.29 (*) 3.87 - 5.11 MIL/uL   Hemoglobin 14.4  12.0 - 15.0 g/dL   HCT 41.4  36.0 - 46.0 %   MCV 78.3  78.0 - 100.0 fL   MCH 27.2  26.0 - 34.0 pg   MCHC 34.8  30.0 - 36.0 g/dL   RDW 15.0  11.5 - 15.5 %   Platelets 255  150 - 400 K/uL   Neutrophils Relative % 69  43 - 77 %   Neutro Abs 5.8  1.7 - 7.7 K/uL   Lymphocytes Relative 21  12 - 46 %   Lymphs Abs 1.8  0.7 - 4.0 K/uL   Monocytes Relative 9  3 - 12 %   Monocytes Absolute 0.8  0.1 - 1.0 K/uL   Eosinophils Relative 1  0 - 5 %   Eosinophils Absolute 0.1  0.0 - 0.7 K/uL   Basophils Relative 0  0 - 1 %   Basophils Absolute 0.0  0.0 - 0.1 K/uL  LIPASE, BLOOD     Status: None   Collection Time    06/03/14 10:23 PM      Result Value Ref Range   Lipase 25  11 - 59 U/L  COMPREHENSIVE METABOLIC PANEL     Status: Abnormal   Collection Time    06/03/14 10:23 PM      Result Value Ref Range   Sodium 137  137 - 147 mEq/L   Potassium 3.1 (*) 3.7 - 5.3 mEq/L   Chloride 97  96 - 112 mEq/L   CO2 24  19 - 32 mEq/L   Glucose, Bld 117 (*) 70 - 99 mg/dL   BUN 6  6 - 23 mg/dL   Creatinine, Ser 0.56  0.50 - 1.10  mg/dL   Calcium 8.7  8.4 - 10.5 mg/dL   Total Protein 6.6  6.0 - 8.3 g/dL   Albumin 3.4 (*) 3.5 - 5.2 g/dL   AST 1398 (*) 0 - 37 U/L   ALT 3295 (*) 0 - 35 U/L   Alkaline Phosphatase 236 (*) 39 - 117 U/L   Total Bilirubin 11.5 (*) 0.3 - 1.2 mg/dL   GFR calc non Af Amer >90  >90 mL/min   GFR calc Af Amer >90  >90 mL/min   Comment: (NOTE)     The eGFR has been calculated using the CKD EPI equation.     This calculation has not been validated in all clinical situations.     eGFR's persistently <90 mL/min signify possible Chronic Kidney     Disease.  PREGNANCY, URINE     Status: None   Collection Time    06/03/14 10:30 PM      Result Value Ref Range   Preg Test, Ur NEGATIVE  NEGATIVE   Comment:            THE  SENSITIVITY OF THIS     METHODOLOGY IS >20 mIU/mL.    Radiology Reports: No results found.   Problem List  Principal Problem:   Acute hepatitis Active Problems:   Cholestatic jaundice   Abnormal urinalysis   Chronic pain syndrome   Hypokalemia   Assessment: This is a 28 year old, Caucasian female, presents with fatigue, nausea, vomiting, and is found to have cholestatic jaundice with significantly elevated transaminases. This could be acute viral hepatitis. Could be due to medications as well, although none is clearly identified. Biliary etiology is also possible.  Plan: #1 cholestatic jaundice with hepatitis: She'll be admitted to the hospital for further workup. CT scan is pending. Might need ultrasound as well. We will consult gastroenterology. Acute hepatitis panel is pending clear with a PT/INR. HIV will be tested. We'll also check Tylenol level. Hold her Phenergan. Monitor LFTs and a daily basis. Albumin is noted to be slightly low. We'll fractionate bilirubin.  #2 abnormal, UA: Infection cannot be ruled out. Continue with Cipro for now. Culture urine.  #3 hypokalemia: This will be repleted.  #4 tobacco abuse. Utilize nicotine patch.  #5 anxiety disorder:  Xanax as needed.  #6 chronic pain syndrome: Hydromorphone as needed.  #7 polysubstance abuse: Urine drug screen is positive for marijuana. Consult Education officer, museum.   DVT Prophylaxis: SCDs Code Status: Full code Family Communication: Discussed with the patient  Disposition Plan: Admit to MedSurg   Further management decisions will depend on results of further testing and patient's response to treatment.   [facility]  [facility] Pager [phone]  If 7PM-7AM, please contact night-coverage [email or web address] Password [facility]  06/03/2014, 11:58 PM  Disclaimer: This note was dictated with voice recognition software. Similar sounding words can inadvertently be transcribed and may not be corrected upon review.

## 2014-06-04 ENCOUNTER — Encounter (HOSPITAL_COMMUNITY): Payer: Self-pay | Admitting: Gastroenterology

## 2014-06-04 DIAGNOSIS — G894 Chronic pain syndrome: Secondary | ICD-10-CM

## 2014-06-04 DIAGNOSIS — K7200 Acute and subacute hepatic failure without coma: Secondary | ICD-10-CM | POA: Diagnosis not present

## 2014-06-04 DIAGNOSIS — N390 Urinary tract infection, site not specified: Secondary | ICD-10-CM

## 2014-06-04 DIAGNOSIS — R17 Unspecified jaundice: Secondary | ICD-10-CM | POA: Diagnosis not present

## 2014-06-04 DIAGNOSIS — F17200 Nicotine dependence, unspecified, uncomplicated: Secondary | ICD-10-CM

## 2014-06-04 DIAGNOSIS — F1910 Other psychoactive substance abuse, uncomplicated: Secondary | ICD-10-CM

## 2014-06-04 LAB — COMPREHENSIVE METABOLIC PANEL
ALBUMIN: 2.9 g/dL — AB (ref 3.5–5.2)
ALT: 2734 U/L — ABNORMAL HIGH (ref 0–35)
AST: 1096 U/L — AB (ref 0–37)
Alkaline Phosphatase: 209 U/L — ABNORMAL HIGH (ref 39–117)
BUN: 4 mg/dL — ABNORMAL LOW (ref 6–23)
CALCIUM: 8.3 mg/dL — AB (ref 8.4–10.5)
CO2: 25 mEq/L (ref 19–32)
CREATININE: 0.48 mg/dL — AB (ref 0.50–1.10)
Chloride: 101 mEq/L (ref 96–112)
GFR calc Af Amer: >90 mL/min (ref >90)
GFR calc non Af Amer: >90 mL/min (ref >90)
Glucose, Bld: 123 mg/dL — ABNORMAL HIGH (ref 70–99)
Potassium: 3.7 mEq/L (ref 3.7–5.3)
Sodium: 138 mEq/L (ref 137–147)
Total Bilirubin: 10.3 mg/dL — ABNORMAL HIGH (ref 0.3–1.2)
Total Protein: 5.9 g/dL — ABNORMAL LOW (ref 6.0–8.3)

## 2014-06-04 LAB — CBC
HCT: 37 % (ref 36.0–46.0)
Hemoglobin: 12.6 g/dL (ref 12.0–15.0)
MCH: 26.8 pg (ref 26.0–34.0)
MCHC: 34.1 g/dL (ref 30.0–36.0)
MCV: 78.6 fL (ref 78.0–100.0)
Platelets: 260 10*3/uL (ref 150–400)
RBC: 4.71 MIL/uL (ref 3.87–5.11)
RDW: 15.1 % (ref 11.5–15.5)
WBC: 7.2 10*3/uL (ref 4.0–10.5)

## 2014-06-04 LAB — BILIRUBIN, FRACTIONATED(TOT/DIR/INDIR)
Bilirubin, Direct: 8.2 mg/dL — ABNORMAL HIGH (ref 0.0–0.3)
Indirect Bilirubin: 3.3 mg/dL — ABNORMAL HIGH (ref 0.3–0.9)
Total Bilirubin: 11.5 mg/dL — ABNORMAL HIGH (ref 0.3–1.2)

## 2014-06-04 LAB — HIV ANTIBODY (ROUTINE TESTING W REFLEX): HIV: NONREACTIVE

## 2014-06-04 LAB — HEPATITIS PANEL, ACUTE
HCV Ab: REACTIVE — AB
HEP B C IGM: NONREACTIVE
Hep A IgM: NONREACTIVE
Hepatitis B Surface Ag: NEGATIVE

## 2014-06-04 LAB — MAGNESIUM: MAGNESIUM: 1.8 mg/dL (ref 1.5–2.5)

## 2014-06-04 LAB — ACETAMINOPHEN LEVEL

## 2014-06-04 LAB — TSH: TSH: 0.246 u[IU]/mL — AB (ref 0.350–4.500)

## 2014-06-04 LAB — PROTIME-INR
INR: 1.52 — ABNORMAL HIGH (ref 0.00–1.49)
Prothrombin Time: 17.9 seconds — ABNORMAL HIGH (ref 11.6–15.2)

## 2014-06-04 MED ORDER — ONDANSETRON HCL 4 MG PO TABS
4.0000 mg | ORAL_TABLET | Freq: Four times a day (QID) | ORAL | Status: DC | PRN
  Filled 2014-06-04: qty 1

## 2014-06-04 MED ORDER — IOHEXOL 300 MG/ML  SOLN
50.0000 mL | Freq: Once | INTRAMUSCULAR | Status: AC | PRN
  Administered 2014-06-04: 50 mL via ORAL

## 2014-06-04 MED ORDER — CLONAZEPAM 0.5 MG PO TABS
1.0000 mg | ORAL_TABLET | Freq: Three times a day (TID) | ORAL | Status: DC | PRN
  Administered 2014-06-04: 1 mg via ORAL
  Filled 2014-06-04: qty 2

## 2014-06-04 MED ORDER — ONDANSETRON HCL 4 MG/2ML IJ SOLN
4.0000 mg | Freq: Four times a day (QID) | INTRAMUSCULAR | Status: DC | PRN
  Administered 2014-06-04 (×2): 4 mg via INTRAVENOUS
  Filled 2014-06-04 (×2): qty 2

## 2014-06-04 MED ORDER — HYDROMORPHONE HCL PF 1 MG/ML IJ SOLN
0.5000 mg | Freq: Once | INTRAMUSCULAR | Status: AC
  Administered 2014-06-04: 0.5 mg via INTRAVENOUS

## 2014-06-04 MED ORDER — POTASSIUM CHLORIDE CRYS ER 20 MEQ PO TBCR
40.0000 meq | EXTENDED_RELEASE_TABLET | Freq: Once | ORAL | Status: AC
  Administered 2014-06-04: 40 meq via ORAL
  Filled 2014-06-04: qty 2

## 2014-06-04 MED ORDER — SODIUM CHLORIDE 0.9 % IV SOLN
INTRAVENOUS | Status: AC
  Administered 2014-06-04 (×2): via INTRAVENOUS

## 2014-06-04 MED ORDER — CLONAZEPAM 0.5 MG PO TABS
0.5000 mg | ORAL_TABLET | Freq: Three times a day (TID) | ORAL | Status: DC | PRN
  Administered 2014-06-04 – 2014-06-07 (×6): 0.5 mg via ORAL
  Filled 2014-06-04 (×6): qty 1

## 2014-06-04 MED ORDER — OXYCODONE HCL 5 MG PO TABS
5.0000 mg | ORAL_TABLET | Freq: Four times a day (QID) | ORAL | Status: DC | PRN
  Administered 2014-06-04 – 2014-06-07 (×11): 5 mg via ORAL
  Filled 2014-06-04 (×11): qty 1

## 2014-06-04 MED ORDER — NICOTINE 14 MG/24HR TD PT24
14.0000 mg | MEDICATED_PATCH | Freq: Every day | TRANSDERMAL | Status: DC
  Administered 2014-06-04 – 2014-06-07 (×4): 14 mg via TRANSDERMAL
  Filled 2014-06-04 (×4): qty 1

## 2014-06-04 MED ORDER — CIPROFLOXACIN IN D5W 400 MG/200ML IV SOLN
400.0000 mg | Freq: Two times a day (BID) | INTRAVENOUS | Status: DC
  Administered 2014-06-04 – 2014-06-07 (×7): 400 mg via INTRAVENOUS
  Filled 2014-06-04 (×7): qty 200

## 2014-06-04 MED ORDER — DOCUSATE SODIUM 100 MG PO CAPS
100.0000 mg | ORAL_CAPSULE | Freq: Two times a day (BID) | ORAL | Status: DC
  Administered 2014-06-04 – 2014-06-06 (×7): 100 mg via ORAL
  Filled 2014-06-04 (×8): qty 1

## 2014-06-04 MED ORDER — HYDROMORPHONE HCL PF 1 MG/ML IJ SOLN
0.5000 mg | INTRAMUSCULAR | Status: DC | PRN
  Administered 2014-06-04 – 2014-06-07 (×14): 0.5 mg via INTRAVENOUS
  Filled 2014-06-04 (×15): qty 1

## 2014-06-04 MED ORDER — ALBUTEROL SULFATE (2.5 MG/3ML) 0.083% IN NEBU: 2.5000 mg | INHALATION_SOLUTION | RESPIRATORY_TRACT | Status: DC | PRN

## 2014-06-04 MED ORDER — POLYETHYLENE GLYCOL 3350 17 G PO PACK
17.0000 g | PACK | Freq: Every day | ORAL | Status: DC
  Administered 2014-06-04 – 2014-06-05 (×2): 17 g via ORAL
  Filled 2014-06-04 (×3): qty 1

## 2014-06-04 MED ORDER — IOHEXOL 300 MG/ML  SOLN
100.0000 mL | Freq: Once | INTRAMUSCULAR | Status: AC | PRN
  Administered 2014-06-04: 100 mL via INTRAVENOUS

## 2014-06-04 MED ORDER — ALPRAZOLAM 0.5 MG PO TABS
0.5000 mg | ORAL_TABLET | Freq: Three times a day (TID) | ORAL | Status: DC | PRN
  Administered 2014-06-04 (×2): 0.5 mg via ORAL
  Filled 2014-06-04 (×2): qty 1

## 2014-06-04 NOTE — Consult Note (Addendum)
Referring Provider: No ref. provider found Primary Care Physician:  No PCP Per Patient Primary Gastroenterologist:  Sandi Fields  Reason for Consultation:  ELEVATED LIVER ENZYMES   Impression: ADMITTED WITH MIXED LIVER INJURY PATTERN MOST LIKELY DUE TO TOXIC INGESTION/ETOH, LESS Jonette EvaLIKELY VIRAL HEPATITIS. NL LIVER ENYZMES IN APR 2015. LAST IV DRUG USE 1 YR AGO. HAS TATTOOS. RECENTLY SMOKE A MARIJUANA.  Plan: 1. SUPPORTIVE CARE 2. AWAIT ACUTE HEPATITIS PANEL 3. ADD MIRALAX DAILY. REDUCE KLONOPIN TO 0.5 MG Q8H PRN. 4. CHECK PT/INR IN AM 5. 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 DENIES TYLENOL INGESTION and taking methadone only as prescribed. FEELING IN HER USUAL STATE OF HEALTH UNTIL AFTER SHE SMOKED A JOINT & DRANK 3 WINE COOLERS ON FRIDAY. NOTICED HER EYES WERE YELLOW AND WAS HAVING UPPER ABDOMINAL PAIN/NAUSEA. NO BM FOR ONE WEEK. CAME TO ED FOR AN EVALUATION AND LIVER ENZYMES NOTED TO BE ELEVATED. PT ADMITTED WITH ACUTE HEPATITIS.  PT DENIES FEVER, CHILLS, HEMATOCHEZIA, vomiting, melena, diarrhea, CHEST PAIN, SHORTNESS OF BREATH,  CHANGE IN BOWEL IN HABITS, abdominal pain, or heartburn.   Past Medical History  Diagnosis Date  . Chronic shoulder pain   . Anxiety     Past Surgical History  Procedure Laterality Date  . Ankle surgery      Prior to Admission medications   Medication Sig Start Date End Date Taking? Authorizing Provider  clonazePAM (KLONOPIN) 1 MG tablet Take 1 mg by mouth 3 (three) times daily as needed. For anxiety   Yes Historical Provider, MD  oxyCODONE (ROXICODONE) 5 MG immediate release tablet Take 1 tablet (5 mg total) by mouth every 6 (six) hours as needed for severe pain. 06/01/14  Yes Kathie DikeHobson M Bryant, PA-C  promethazine (PHENERGAN) 25 MG suppository Place 1 suppository (25 mg total) rectally every 6 (six) hours as needed for nausea or vomiting. 06/01/14  Yes Kathie DikeHobson M Bryant, PA-C    Current  Facility-Administered Medications  Medication Dose Route Frequency Provider Last Rate Last Dose  . 0.9 %  sodium chloride infusion   Intravenous Continuous Osvaldo ShipperGokul Krishnan, MD 75 mL/hr at 06/04/14 1134    . albuterol (PROVENTIL) (2.5 MG/3ML) 0.083% nebulizer solution 2.5 mg  2.5 mg Nebulization Q2H PRN Osvaldo ShipperGokul Krishnan, MD      . ciprofloxacin (CIPRO) IVPB 400 mg  400 mg Intravenous Q12H Osvaldo ShipperGokul Krishnan, MD   400 mg at 06/04/14 1128  . clonazePAM (KLONOPIN) tablet 1 mg  1 mg Oral TID PRN Maryann Mikhail, DO   1 mg at 06/04/14 1159  . docusate sodium (COLACE) capsule 100 mg  100 mg Oral BID Osvaldo ShipperGokul Krishnan, MD   100 mg at 06/04/14 0912  . HYDROmorphone (DILAUDID) injection 0.5 mg  0.5 mg Intravenous Q4H PRN Osvaldo ShipperGokul Krishnan, MD   0.5 mg at 06/04/14 0911  . nicotine (NICODERM CQ - dosed in mg/24 hours) patch 14 mg  14 mg Transdermal Daily Osvaldo ShipperGokul Krishnan, MD   14 mg at 06/04/14 0912  . ondansetron (ZOFRAN) tablet 4 mg  4 mg Oral Q6H PRN Osvaldo ShipperGokul Krishnan, MD       Or  . ondansetron [facility](ZOFRAN) injection 4 mg  4 mg Intravenous Q6H PRN Osvaldo ShipperGokul Krishnan, MD      . oxyCODONE (Oxy IR/ROXICODONE) immediate release tablet 5 mg  5 mg Oral Q6H PRN Maryann Mikhail, DO   5 mg at 06/04/14 1159    Allergies as of 06/03/2014 - Review Complete  06/03/2014  Allergen Reaction Noted  . Penicillins Hives and Other (See Comments)   . Tylenol [acetaminophen]  02/22/2014  . Venlafaxine Hives and Other (See Comments)   . Morphine and related Hives and Other (See Comments) 10/09/2011    Family History  Problem Relation Age of Onset  . Cirrhosis Father   . Hepatitis C Father     History   Social History  . Marital Status: Legally Separated    Spouse Name: [name]    Number of Children: 2  . Years of Education: [name]   Occupational History  . UNEMPLOYED    Social History Main Topics  . Smoking status: Current Every Day Smoker -- 1.00 packs/day    Types: Cigarettes  .    Marland Kitchen. Alcohol Use: Yes  . Drug Use: Yes    Special:  Marijuana, Heroin     Comment: today  . Sexual Activity: Yes    Birth Control/ Protection: IUD   Review of Systems: PER HPI OTHERWISE ALL SYSTEMS ARE NEGATIVE. Boyfriend present at bedside but he reports he drinks ETOH BUT doesn't use drugs.   Vitals: Blood pressure 101/66, pulse 84, temperature 98.3 F (36.8 C), temperature source Oral, resp. rate 20, height 5\' 7"  (1.702 m), weight 125 lb 9.6 oz (56.972 kg), SpO2 100.00%.  Physical Exam: General:   Alert,  Well-developed, well-nourished, pleasant and cooperative in NAD Head:  Normocephalic and atraumatic. Eyes:  Sclera clear, WITH icterus.   Conjunctiva pink. Mouth:  No lesions, dentition ABnormal. Neck:  Supple; no masses. Lungs:  Clear throughout to auscultation.   No wheezes. No acute distress. Heart:  Regular rate and rhythm; no murmurs. Abdomen:  Soft, MILD TTP IN THE EPIGASTRIUM & BUQS, nondistended. No masses noted. Normal bowel sounds, without guarding, and without rebound.   Msk:  Symmetrical without gross deformities. Normal posture. Extremities:  Without edema. Neurologic:  Alert and  oriented x4;  grossly normal neurologically. Cervical Nodes:  No significant cervical adenopathy. Psych:  Alert and cooperative. Normal mood and affect.  Lab Results:  Recent Labs  06/03/14 2223 06/04/14 0627  WBC 8.5 7.2  HGB 14.4 12.6  HCT 41.4 37.0  PLT 255 260   BMET  Recent Labs  06/03/14 2223 06/04/14 0627  NA 137 138  K 3.1* 3.7  CL 97 101  CO2 24 25  GLUCOSE 117* 123*  BUN 6 4*  CREATININE 0.56 0.48*  CALCIUM 8.7 8.3*   LFT  Recent Labs  06/03/14 2223 06/04/14 0627  PROT 6.6 5.9*  ALBUMIN 3.4* 2.9*  AST 1398* 1096*  ALT 3295* 2734*  ALKPHOS 236* 209*  BILITOT 11.5*  11.5* 10.3*  BILIDIR 8.2*  --   IBILI 3.3*  --      Studies/Results: CT ABD/PELVIS JUN 19-NL LIVER/GALLBLADDER   LOS: 1 day   Sandi Fields  06/04/2014, 1:09 PM

## 2014-06-04 NOTE — Progress Notes (Signed)
Patient asked RN to come to room.  Patient having severe back and chest pain.  VS obtained and reported to MD.  Order to give one time dose of dilaudid in between doses. Will continue to monitor.

## 2014-06-04 NOTE — Progress Notes (Signed)
[facility]                                                                              Patient Demographics  Heidi [name], is a 28 y.o. female, DOB - 05-22-1986, WUJ:[id number]  Admit date - 06/03/2014   Admitting Physician Osvaldo Shipper, MD  Outpatient Primary MD for the patient is No PCP Per Patient  LOS - 1   Chief Complaint  Patient presents with  . Fatigue  . Back Pain      HPI: Heidi [name] is a 28 y.o. female with a past medical history of chronic pain in her right shoulder and upper back, anxiety disorder, who was in her usual state of health till about 5 days ago, when she started having nausea and had a few episodes of vomiting. She felt fatigued. She denies any blood in the emesis. She has not had a bowel movement in a week. She also started having pain in her upper abdomen on and off, sharp pain, 8/10 in intensity without any radiation. She takes Klonopin, oxycodone, and has taken Phenergan suppositories recently for her nausea. Denied taking Tylenol. Rarely drinks alcohol. She admits to IV drug use about 6 months ago, but nothing recently. She does have tattoos on her body done over the last year or so. Denied any recent travel or any sick contacts. She actually presented to the emergency department 2 days ago, with similar complaints. She was treated symptomatically and was discharged home. Her symptoms persisted, and then her eyes turned yellow yesterday and so, she decided to come in to the hospital. She is not a very good historian. Patient kept texting on her phone while the admitting physician was speaking to her.   Assessment & Plan   Intractable Abdominal pain, nausea, vomiting secondary to Cholestatic jaundice/Elevated LFTs -Viral hepatitis? -Hepatitis panel and HIV pending -Continue symptomatic management/treatment with IV fluids, clear liquid diet, antiemetics  -Gastroenterology consulted and awaiting further recommendations -Will continue to monitor  CMP -CT abd/pelvis ?cystitis, otherwise unremarkable  Urinary Tract Infection -UA: WBC 21-50, positive nitrites, moderate leukocytes, few bacteria -patient does have dysuria -pending urine culture -Continue cipro  Hypokalemia -Replaced, likely secondary to GI losses -Will continue to monitor  Polysubstance abuse including THC and Tobacco  -Urine drug screen positive for marijuana -Patient counseled -Continue nicotine patch  Anxiety Disorder -Continue Klonopin as needed  Chronic pain syndrome -Continue pain control with hydromorphone as needed -Will add oxycodone for breakthrough pain  Code Status: Full  Family Communication: fiancee at bedside  Disposition Plan: Admitted  Time Spent in minutes   30 minutes  Procedures  None  Consults   Gastroenterology  DVT Prophylaxis  SCDs  Lab Results  Component Value Date   PLT 260 06/04/2014    Medications  Scheduled Meds: . ciprofloxacin  400 mg Intravenous Q12H  . docusate sodium  100 mg Oral BID  . nicotine  14 mg Transdermal Daily   Continuous Infusions: . sodium chloride 75 mL/hr at 06/04/14 1134   PRN Meds:.albuterol, clonazePAM, HYDROmorphone (DILAUDID) injection, ondansetron (ZOFRAN) IV, ondansetron, oxyCODONE  Antibiotics    Anti-infectives   Start     Dose/Rate Route Frequency Ordered Stop  06/04/14 1100  ciprofloxacin (CIPRO) IVPB 400 mg     400 mg 200 mL/hr over 60 Minutes Intravenous Every 12 hours 06/04/14 0145     06/03/14 2330  ciprofloxacin (CIPRO) IVPB 400 mg     400 mg 200 mL/hr over 60 Minutes Intravenous  Once 06/03/14 2318 06/04/14 0103        Subjective:   Heidi [name] seen and examined today.  Patient continues to complain of abdominal pain, particularly in the RUQ and LUQ.  Patient states this has not occurred before.  She complains of continued nausea.  She denies dizziness, chest pain, shortness of breath.   Objective:   Filed Vitals:   06/03/14 2211 06/04/14 0121 06/04/14  0207 06/04/14 0700  BP: 108/69 98/67 102/70 101/66  Pulse: 103 97 93 84  Temp: 98.9 F (37.2 C)  98.4 F (36.9 C) 98.3 F (36.8 C)  TempSrc:   Oral Oral  Resp: 20 16 18 20   Height: 5\' 7"  (1.702 m)  5\' 7"  (1.702 m)   Weight: 60.017 kg (132 lb 5 oz)  56.972 kg (125 lb 9.6 oz)   SpO2: 99% 99% 100% 100%    Wt Readings from Last 3 Encounters:  06/04/14 56.972 kg (125 lb 9.6 oz)  06/01/14 58.968 kg (130 lb)  04/04/14 55.792 kg (123 lb)     Intake/Output Summary (Last 24 hours) at 06/04/14 1212 Last data filed at 06/04/14 1134  Gross per 24 hour  Intake    120 ml  Output    200 ml  Net    -80 ml    Exam  General: Well developed, well nourished, NAD, appears stated age  HEENT: NCAT, PERRLA, EOMI, Icteic Sclera, mucous membranes moist.   Neck: Supple, no JVD, no masses  Cardiovascular: S1 S2 auscultated, no rubs, murmurs or gallops. Regular rate and rhythm.  Respiratory: Clear to auscultation bilaterally with equal chest rise  Abdomen: Soft, mild RUQ tenderness, nondistended, + bowel sounds  Extremities: warm dry without cyanosis clubbing or edema  Neuro: AAOx3, cranial nerves grossly intact. Strength 5/5 in patient's upper and lower extremities bilaterally  Skin: Without rashes exudates or nodules, jaundice, tattoos   Psych: Normal affect and demeanor with intact judgement and insight  Data Review   Micro Results No results found for this or any previous visit (from the past 240 hour(s)).  Radiology Reports Ct Abdomen Pelvis W Contrast  06/04/2014   CLINICAL DATA:  Nausea, vomiting, fatigue and abdominal pain. Constipation.  EXAM: CT ABDOMEN AND PELVIS WITH CONTRAST  TECHNIQUE: Multidetector CT imaging of the abdomen and pelvis was performed using the standard protocol following bolus administration of intravenous contrast.  CONTRAST:  100 mL of Omnipaque 300 IV contrast  COMPARISON:  Pelvic ultrasound performed 03/10/2010  FINDINGS: Minimal right basilar atelectasis  is noted.  The liver and spleen are unremarkable in appearance. The gallbladder is within normal limits. The pancreas and adrenal glands are unremarkable.  The kidneys are unremarkable in appearance. There is no evidence of hydronephrosis. No renal or ureteral stones are seen. No perinephric stranding is appreciated.  No free fluid is identified. The small bowel is unremarkable in appearance. The stomach is within normal limits. No acute vascular abnormalities are seen.  The appendix is normal in caliber and contains air, without evidence for appendicitis. The colon is unremarkable in appearance.  The bladder is mildly distended and appears thick walled; would correlate for evidence of cystitis. The uterus is grossly unremarkable in appearance, with an  intrauterine device in expected position at the fundus of the uterus. The ovaries are relatively symmetric; no suspicious adnexal masses are seen. No inguinal lymphadenopathy is seen.  No acute osseous abnormalities are identified.  IMPRESSION: 1. Mild bladder wall thickening could reflect cystitis, particularly given the patient's urinalysis results. 2. Otherwise unremarkable CT of the abdomen and pelvis.   Electronically Signed   By: Roanna RaiderJeffery  Chang M.D.   On: 06/04/2014 00:46    CBC  Recent Labs Lab 06/03/14 2223 06/04/14 0627  WBC 8.5 7.2  HGB 14.4 12.6  HCT 41.4 37.0  PLT 255 260  MCV 78.3 78.6  MCH 27.2 26.8  MCHC 34.8 34.1  RDW 15.0 15.1  LYMPHSABS 1.8  --   MONOABS 0.8  --   EOSABS 0.1  --   BASOSABS 0.0  --     Chemistries   Recent Labs Lab 06/03/14 2223 06/04/14 0627  NA 137 138  K 3.1* 3.7  CL 97 101  CO2 24 25  GLUCOSE 117* 123*  BUN 6 4*  CREATININE 0.56 0.48*  CALCIUM 8.7 8.3*  MG  --  1.8  AST 1398* 1096*  ALT 3295* 2734*  ALKPHOS 236* 209*  BILITOT 11.5*  11.5* 10.3*   ------------------------------------------------------------------------------------------------------------------ estimated creatinine  clearance is 96.7 ml/min (by C-G formula based on Cr of 0.48). ------------------------------------------------------------------------------------------------------------------ No results found for this basename: HGBA1C,  in the last 72 hours ------------------------------------------------------------------------------------------------------------------ No results found for this basename: CHOL, HDL, LDLCALC, TRIG, CHOLHDL, LDLDIRECT,  in the last 72 hours ------------------------------------------------------------------------------------------------------------------ No results found for this basename: TSH, T4TOTAL, FREET3, T3FREE, THYROIDAB,  in the last 72 hours ------------------------------------------------------------------------------------------------------------------ No results found for this basename: VITAMINB12, FOLATE, FERRITIN, TIBC, IRON, RETICCTPCT,  in the last 72 hours  Coagulation profile  Recent Labs Lab 06/03/14 2223  INR 1.52*    No results found for this basename: DDIMER,  in the last 72 hours  Cardiac Enzymes No results found for this basename: CK, CKMB, TROPONINI, MYOGLOBIN,  in the last 168 hours ------------------------------------------------------------------------------------------------------------------ No components found with this basename: POCBNP,     MIKHAIL, MARYANN D.O. on 06/04/2014 at 12:12 PM  Between 7am to 7pm - Pager - [phone]  After 7pm go to [email or web address] - password [email or web address]  And look for the night coverage person covering for me after hours  [facility] Office  [phone]

## 2014-06-04 NOTE — Progress Notes (Signed)
Patient asked about smoking when administering nicotine patch this morning.  Explained the hospitals policy of being a tobacco free campus.  Also instructed patient that if she went outside unattended that I would not be able to be there incase there were to be a medical emergency and she need assistance.  Pt verbalizes understanding.

## 2014-06-05 DIAGNOSIS — K7200 Acute and subacute hepatic failure without coma: Secondary | ICD-10-CM | POA: Diagnosis not present

## 2014-06-05 DIAGNOSIS — F1910 Other psychoactive substance abuse, uncomplicated: Secondary | ICD-10-CM

## 2014-06-05 DIAGNOSIS — K759 Inflammatory liver disease, unspecified: Secondary | ICD-10-CM

## 2014-06-05 DIAGNOSIS — R17 Unspecified jaundice: Secondary | ICD-10-CM | POA: Diagnosis not present

## 2014-06-05 DIAGNOSIS — B1710 Acute hepatitis C without hepatic coma: Secondary | ICD-10-CM

## 2014-06-05 LAB — CBC
HEMATOCRIT: 36.6 % (ref 36.0–46.0)
Hemoglobin: 12.2 g/dL (ref 12.0–15.0)
MCH: 26.2 pg (ref 26.0–34.0)
MCHC: 33.3 g/dL (ref 30.0–36.0)
MCV: 78.5 fL (ref 78.0–100.0)
Platelets: 236 10*3/uL (ref 150–400)
RBC: 4.66 MIL/uL (ref 3.87–5.11)
RDW: 15.4 % (ref 11.5–15.5)
WBC: 6.1 10*3/uL (ref 4.0–10.5)

## 2014-06-05 LAB — URINE CULTURE: Colony Count: 5000

## 2014-06-05 LAB — COMPREHENSIVE METABOLIC PANEL
ALBUMIN: 2.5 g/dL — AB (ref 3.5–5.2)
ALK PHOS: 175 U/L — AB (ref 39–117)
ALT: 2087 U/L — ABNORMAL HIGH (ref 0–35)
AST: 1054 U/L — ABNORMAL HIGH (ref 0–37)
BUN: 6 mg/dL (ref 6–23)
CALCIUM: 8.1 mg/dL — AB (ref 8.4–10.5)
CO2: 22 mEq/L (ref 19–32)
CREATININE: 0.52 mg/dL (ref 0.50–1.10)
Chloride: 102 mEq/L (ref 96–112)
GFR calc Af Amer: >90 mL/min (ref >90)
GFR calc non Af Amer: >90 mL/min (ref >90)
Glucose, Bld: 103 mg/dL — ABNORMAL HIGH (ref 70–99)
POTASSIUM: 4.1 meq/L (ref 3.7–5.3)
Sodium: 136 mEq/L — ABNORMAL LOW (ref 137–147)
TOTAL PROTEIN: 5.1 g/dL — AB (ref 6.0–8.3)
Total Bilirubin: 10.4 mg/dL — ABNORMAL HIGH (ref 0.3–1.2)

## 2014-06-05 LAB — PROTIME-INR
INR: 1.78 — AB (ref 0.00–1.49)
PROTHROMBIN TIME: 20.2 s — AB (ref 11.6–15.2)

## 2014-06-05 LAB — T4, FREE: Free T4: 1.33 ng/dL (ref 0.80–1.80)

## 2014-06-05 MED ORDER — VITAMIN K1 10 MG/ML IJ SOLN
10.0000 mg | Freq: Every day | INTRAVENOUS | Status: DC
  Administered 2014-06-05 – 2014-06-06 (×2): 10 mg via INTRAVENOUS
  Filled 2014-06-05 (×3): qty 1

## 2014-06-05 MED ORDER — HYDROMORPHONE HCL PF 1 MG/ML IJ SOLN
0.5000 mg | Freq: Three times a day (TID) | INTRAMUSCULAR | Status: DC
  Administered 2014-06-05 – 2014-06-07 (×6): 0.5 mg via INTRAVENOUS
  Filled 2014-06-05 (×6): qty 1

## 2014-06-05 MED ORDER — ONDANSETRON HCL 4 MG PO TABS
4.0000 mg | ORAL_TABLET | Freq: Three times a day (TID) | ORAL | Status: DC
  Administered 2014-06-05 – 2014-06-07 (×9): 4 mg via ORAL
  Filled 2014-06-05 (×9): qty 1

## 2014-06-05 NOTE — Progress Notes (Signed)
Patient seen by staff member outside smoking.  Discussed no smoking policy with patient again.  Verbalizes understanding.

## 2014-06-05 NOTE — Progress Notes (Signed)
  Assessment/Plan: ADMITTED WITH JAUNDICE/RUQ ABDOMINAL PAIN DUE TO ACUTE HEPATITIS C. ON ABX FOR UTI.  INR INCREASING LIKELY DUE TO VI T K DEFICIENCY/ABX & NOT LIVER DYSFUNCTION  PLAN: 1. DISCUSSED CASE WITH DR. DARLING. AGREES WITH SUPPORTIVE CARE. AWAIT HCV GENOTYPE/HCV RNA. REPEAT HCV RNA IN 12 WEEKS. 2. VIT K QD FOR 3 DAYS 3. ZOFRAN ATC 4. DIL 0.5 MG TID AND PRN. EXPLAINED TO PT WE NEED TO BE CAREFUL WITH NARCOTICS DUE TO HER LIVER DISEASE AND RISK OF SEDATION. 5. PT DECLINED CHANGE IN DIET 6. PT/INR IN AM    Subjective: Since I last evaluated the patient SHE IS C/O RUQ PAIN/FLANK/BACK PAIN. NAUSEA NOT WELL CONTROLLED. NO VOMITING. POOR PO INTAKE  Objective: Vital signs in last 24 hours: Filed Vitals:   06/05/14 0623  BP: 132/79  Pulse: 100  Temp: 98.9 F (37.2 C)  Resp: 20    General appearance: alert, cooperative and no distress Resp: clear to auscultation bilaterally Cardio: regular rate and rhythm GI: soft, MILD TO MODERATE TTP IN THE EPIGASTRIUM/RUQ, NO REBOUND OR GUARDING, bowel sounds normal NEURO: NO FOCAL DEFICITS  Lab Results: ALT 2084 AS 1054 INR 1.78 Hb 12.2 Cr 0.52    Studies/Results: Ct Abdomen Pelvis W Contrast  06/04/2014   CLINICAL DATA:  Nausea, vomiting, fatigue and abdominal pain. Constipation.  EXAM: CT ABDOMEN AND PELVIS WITH CONTRAST  TECHNIQUE: Multidetector CT imaging of the abdomen and pelvis was performed using the standard protocol following bolus administration of intravenous contrast.  CONTRAST:  100 mL of Omnipaque 300 IV contrast  COMPARISON:  Pelvic ultrasound performed 03/10/2010  FINDINGS: Minimal right basilar atelectasis is noted.  The liver and spleen are unremarkable in appearance. The gallbladder is within normal limits. The pancreas and adrenal glands are unremarkable.  The kidneys are unremarkable in appearance. There is no evidence of hydronephrosis. No renal or ureteral stones are seen. No perinephric stranding is appreciated.   No free fluid is identified. The small bowel is unremarkable in appearance. The stomach is within normal limits. No acute vascular abnormalities are seen.  The appendix is normal in caliber and contains air, without evidence for appendicitis. The colon is unremarkable in appearance.  The bladder is mildly distended and appears thick walled; would correlate for evidence of cystitis. The uterus is grossly unremarkable in appearance, with an intrauterine device in expected position at the fundus of the uterus. The ovaries are relatively symmetric; no suspicious adnexal masses are seen. No inguinal lymphadenopathy is seen.  No acute osseous abnormalities are identified.  IMPRESSION: 1. Mild bladder wall thickening could reflect cystitis, particularly given the patient's urinalysis results. 2. Otherwise unremarkable CT of the abdomen and pelvis.   Electronically Signed   By: Roanna RaiderJeffery  Chang M.D.   On: 06/04/2014 00:46    Medications: I have reviewed the patient's current medications.   LOS: 5 days   Jonette EvaSandi Fields 05/26/2014, 2:23 PM

## 2014-06-05 NOTE — Progress Notes (Addendum)
[facility]                                                                              Patient Demographics  Heidi [name], is a 28 y.o. female, DOB - 05/03/1986, ZOX:[id number]  Admit date - 06/03/2014   Admitting Physician Osvaldo ShipperGokul Krishnan, MD  Outpatient Primary MD for the patient is No PCP Per Patient  LOS - 2   Chief Complaint  Patient presents with  . Fatigue  . Back Pain      HPI: Heidi [name] is a 28 y.o. female with a past medical history of chronic pain in her right shoulder and upper back, anxiety disorder, who was in her usual state of health till about 5 days ago, when she started having nausea and had a few episodes of vomiting. She felt fatigued. She denies any blood in the emesis. She has not had a bowel movement in a week. She also started having pain in her upper abdomen on and off, sharp pain, 8/10 in intensity without any radiation. She takes Klonopin, oxycodone, and has taken Phenergan suppositories recently for her nausea. Denied taking Tylenol. Rarely drinks alcohol. She admits to IV drug use about 6 months ago, but nothing recently. She does have tattoos on her body done over the last year or so. Denied any recent travel or any sick contacts. She actually presented to the emergency department 2 days ago, with similar complaints. She was treated symptomatically and was discharged home. Her symptoms persisted, and then her eyes turned yellow yesterday and so, she decided to come in to the hospital. She is not a very good historian. Patient kept texting on her phone while the admitting physician was speaking to her.   Assessment & Plan   Intractable Abdominal pain, nausea, vomiting secondary to Cholestatic jaundice/Elevated LFTs -Viral hepatitis? -CT abd/pelvis ?cystitis, otherwise unremarkable -hepatitis panel positive for Hepatitis C Ab, will obtain HCV RNA -HIV nonreactive -Continue supportive management/treatment with IV fluids, antiemetics  -Patient  initially placed on clear liquid diet and transitioned to regular diet -Gastroenterology consulted and recommended supportive care, decreased Klonopin -AST/ALT trending downward, however, INR still elevated 1.78 -Will continue to monitor CMP  Urinary Tract Infection -UA: WBC 21-50, positive nitrites, moderate leukocytes, few bacteria -patient does have dysuria -pending urine culture -Continue cipro  Hypokalemia -Resolved, likely secondary to GI losses  Polysubstance abuse including THC and Tobacco  -Urine drug screen positive for marijuana -Patient counseled -Continue nicotine patch  Anxiety Disorder -Continue Klonopin as needed  Chronic pain syndrome -Continue pain control with hydromorphone as needed -Will add oxycodone for breakthrough pain  Code Status: Full  Family Communication: fiancee at bedside  Disposition Plan: Admitted  Time Spent in minutes   25 minutes  Procedures  None  Consults   Gastroenterology  DVT Prophylaxis  SCDs  Lab Results  Component Value Date   PLT 236 06/05/2014    Medications  Scheduled Meds: . ciprofloxacin  400 mg Intravenous Q12H  . docusate sodium  100 mg Oral BID  . nicotine  14 mg Transdermal Daily  . polyethylene glycol  17 g Oral Q breakfast   Continuous Infusions:   PRN Meds:.albuterol, clonazePAM, HYDROmorphone (  DILAUDID) injection, ondansetron (ZOFRAN) IV, ondansetron, oxyCODONE  Antibiotics    Anti-infectives   Start     Dose/Rate Route Frequency Ordered Stop   06/04/14 1100  ciprofloxacin (CIPRO) IVPB 400 mg     400 mg 200 mL/hr over 60 Minutes Intravenous Every 12 hours 06/04/14 0145     06/03/14 2330  ciprofloxacin (CIPRO) IVPB 400 mg     400 mg 200 mL/hr over 60 Minutes Intravenous  Once 06/03/14 2318 06/04/14 0103        Subjective:   Heidi Rose seen and examined today.  Patient continues to complain of abdominal pain and nausea.  She states her abdominal pain has worsened from yesterday,  however, patient has been eating.  Patient denies shortness of breath or chest pain.   Objective:   Filed Vitals:   06/04/14 1457 06/04/14 1619 06/04/14 2331 06/05/14 0623  BP: 110/70 91/53 99/63  132/79  Pulse: 69 62 81 100  Temp: 98 F (36.7 C)  98.6 F (37 C) 98.9 F (37.2 C)  TempSrc: Oral  Oral Oral  Resp: 20 20 20 20   Height:      Weight:      SpO2: 100% 100% 99%     Wt Readings from Last 3 Encounters:  06/04/14 56.972 kg (125 lb 9.6 oz)  06/01/14 58.968 kg (130 lb)  04/04/14 55.792 kg (123 lb)     Intake/Output Summary (Last 24 hours) at 06/05/14 1043 Last data filed at 06/05/14 0910  Gross per 24 hour  Intake 2813.75 ml  Output    300 ml  Net 2513.75 ml    Exam  General: Well developed, well nourished, NAD, appears stated age  HEENT: NCAT, mucous membranes moist.   Neck: Supple, no JVD, no masses  Cardiovascular: S1 S2 auscultated, no rubs, murmurs or gallops. Regular rate and rhythm.  Respiratory: Clear to auscultation bilaterally with equal chest rise  Abdomen: Soft, RUQ tenderness, nondistended, + bowel sounds  Extremities: warm dry without cyanosis clubbing or edema  Neuro: AAOx3, no focal deficits  Skin: Without rashes exudates or nodules, jaundice, tattoos   Psych: Depressed mood, anxious affect  Data Review   Micro Results No results found for this or any previous visit (from the past 240 hour(s)).  Radiology Reports Ct Abdomen Pelvis W Contrast  06/04/2014   CLINICAL DATA:  Nausea, vomiting, fatigue and abdominal pain. Constipation.  EXAM: CT ABDOMEN AND PELVIS WITH CONTRAST  TECHNIQUE: Multidetector CT imaging of the abdomen and pelvis was performed using the standard protocol following bolus administration of intravenous contrast.  CONTRAST:  100 mL of Omnipaque 300 IV contrast  COMPARISON:  Pelvic ultrasound performed 03/10/2010  FINDINGS: Minimal right basilar atelectasis is noted.  The liver and spleen are unremarkable in appearance.  The gallbladder is within normal limits. The pancreas and adrenal glands are unremarkable.  The kidneys are unremarkable in appearance. There is no evidence of hydronephrosis. No renal or ureteral stones are seen. No perinephric stranding is appreciated.  No free fluid is identified. The small bowel is unremarkable in appearance. The stomach is within normal limits. No acute vascular abnormalities are seen.  The appendix is normal in caliber and contains air, without evidence for appendicitis. The colon is unremarkable in appearance.  The bladder is mildly distended and appears thick walled; would correlate for evidence of cystitis. The uterus is grossly unremarkable in appearance, with an intrauterine device in expected position at the fundus of the uterus. The ovaries are relatively symmetric; no suspicious  adnexal masses are seen. No inguinal lymphadenopathy is seen.  No acute osseous abnormalities are identified.  IMPRESSION: 1. Mild bladder wall thickening could reflect cystitis, particularly given the patient's urinalysis results. 2. Otherwise unremarkable CT of the abdomen and pelvis.   Electronically Signed   By: Roanna RaiderJeffery  Chang M.D.   On: 06/04/2014 00:46    CBC  Recent Labs Lab 06/03/14 2223 06/04/14 0627 06/05/14 0639  WBC 8.5 7.2 6.1  HGB 14.4 12.6 12.2  HCT 41.4 37.0 36.6  PLT 255 260 236  MCV 78.3 78.6 78.5  MCH 27.2 26.8 26.2  MCHC 34.8 34.1 33.3  RDW 15.0 15.1 15.4  LYMPHSABS 1.8  --   --   MONOABS 0.8  --   --   EOSABS 0.1  --   --   BASOSABS 0.0  --   --     Chemistries   Recent Labs Lab 06/03/14 2223 06/04/14 0627 06/05/14 0639  NA 137 138 136*  K 3.1* 3.7 4.1  CL 97 101 102  CO2 24 25 22   GLUCOSE 117* 123* 103*  BUN 6 4* 6  CREATININE 0.56 0.48* 0.52  CALCIUM 8.7 8.3* 8.1*  MG  --  1.8  --   AST 1398* 1096* 1054*  ALT 3295* 2734* 2087*  ALKPHOS 236* 209* 175*  BILITOT 11.5*  11.5* 10.3* 10.4*    ------------------------------------------------------------------------------------------------------------------ estimated creatinine clearance is 96.7 ml/min (by C-G formula based on Cr of 0.52). ------------------------------------------------------------------------------------------------------------------ No results found for this basename: HGBA1C,  in the last 72 hours ------------------------------------------------------------------------------------------------------------------ No results found for this basename: CHOL, HDL, LDLCALC, TRIG, CHOLHDL, LDLDIRECT,  in the last 72 hours ------------------------------------------------------------------------------------------------------------------  Recent Labs  06/04/14 0627  TSH 0.246*   ------------------------------------------------------------------------------------------------------------------ No results found for this basename: VITAMINB12, FOLATE, FERRITIN, TIBC, IRON, RETICCTPCT,  in the last 72 hours  Coagulation profile  Recent Labs Lab 06/03/14 2223 06/05/14 0639  INR 1.52* 1.78*    No results found for this basename: DDIMER,  in the last 72 hours  Cardiac Enzymes No results found for this basename: CK, CKMB, TROPONINI, MYOGLOBIN,  in the last 168 hours ------------------------------------------------------------------------------------------------------------------ No components found with this basename: POCBNP,     MIKHAIL, MARYANN D.O. on 06/05/2014 at 10:43 AM  Between 7am to 7pm - Pager - [phone]  After 7pm go to [email or web address] - password [email or web address]  And look for the night coverage person covering for me after hours  [facility] Office  [phone]

## 2014-06-06 DIAGNOSIS — B1710 Acute hepatitis C without hepatic coma: Principal | ICD-10-CM

## 2014-06-06 LAB — COMPREHENSIVE METABOLIC PANEL
ALT: 2106 U/L — ABNORMAL HIGH (ref 0–35)
AST: 1240 U/L — AB (ref 0–37)
Albumin: 2.7 g/dL — ABNORMAL LOW (ref 3.5–5.2)
Alkaline Phosphatase: 191 U/L — ABNORMAL HIGH (ref 39–117)
BUN: 5 mg/dL — ABNORMAL LOW (ref 6–23)
CALCIUM: 8.3 mg/dL — AB (ref 8.4–10.5)
CO2: 25 mEq/L (ref 19–32)
CREATININE: 0.66 mg/dL (ref 0.50–1.10)
Chloride: 104 mEq/L (ref 96–112)
GFR calc Af Amer: >90 mL/min (ref >90)
GFR calc non Af Amer: >90 mL/min (ref >90)
Glucose, Bld: 85 mg/dL (ref 70–99)
Potassium: 4.6 mEq/L (ref 3.7–5.3)
SODIUM: 140 meq/L (ref 137–147)
TOTAL PROTEIN: 5.8 g/dL — AB (ref 6.0–8.3)
Total Bilirubin: 13.1 mg/dL — ABNORMAL HIGH (ref 0.3–1.2)

## 2014-06-06 LAB — PROTIME-INR
INR: 1.58 — AB (ref 0.00–1.49)
Prothrombin Time: 18.4 seconds — ABNORMAL HIGH (ref 11.6–15.2)

## 2014-06-06 LAB — CBC
HCT: 38.3 % (ref 36.0–46.0)
Hemoglobin: 12.8 g/dL (ref 12.0–15.0)
MCH: 26.4 pg (ref 26.0–34.0)
MCHC: 33.4 g/dL (ref 30.0–36.0)
MCV: 79.1 fL (ref 78.0–100.0)
PLATELETS: 288 10*3/uL (ref 150–400)
RBC: 4.84 MIL/uL (ref 3.87–5.11)
RDW: 15.7 % — AB (ref 11.5–15.5)
WBC: 7 10*3/uL (ref 4.0–10.5)

## 2014-06-06 MED ORDER — PANTOPRAZOLE SODIUM 40 MG PO TBEC
40.0000 mg | DELAYED_RELEASE_TABLET | Freq: Every day | ORAL | Status: DC
  Administered 2014-06-06 – 2014-06-07 (×2): 40 mg via ORAL
  Filled 2014-06-06 (×2): qty 1

## 2014-06-06 NOTE — Progress Notes (Signed)
[facility]                                                                              Patient Demographics  Heidi [name], is a 28 y.o. female, DOB - 03/06/1986, WUJ:[id number]  Admit date - 06/03/2014   Admitting Physician Osvaldo ShipperGokul Krishnan, MD  Outpatient Primary MD for the patient is No PCP Per Patient  LOS - 3   Chief Complaint  Patient presents with  . Fatigue  . Back Pain      HPI: Heidi [name] is a 28 y.o. female with a past medical history of chronic pain in her right shoulder and upper back, anxiety disorder, who was in her usual state of health till about 5 days ago, when she started having nausea and had a few episodes of vomiting. She felt fatigued. She denies any blood in the emesis. She has not had a bowel movement in a week. She also started having pain in her upper abdomen on and off, sharp pain, 8/10 in intensity without any radiation. She takes Klonopin, oxycodone, and has taken Phenergan suppositories recently for her nausea. Denied taking Tylenol. Rarely drinks alcohol. She admits to IV drug use about 6 months ago, but nothing recently. She does have tattoos on her body done over the last year or so. Denied any recent travel or any sick contacts. She actually presented to the emergency department 2 days ago, with similar complaints. She was treated symptomatically and was discharged home. Her symptoms persisted, and then her eyes turned yellow yesterday and so, she decided to come in to the hospital. She is not a very good historian. Patient kept texting on her phone while the admitting physician was speaking to her.   Assessment & Plan   Intractable Abdominal pain, nausea, vomiting secondary to Cholestatic jaundice/Elevated LFTs -CT abd/pelvis ?cystitis, otherwise unremarkable -hepatitis panel positive for Hepatitis C Ab -HCV RNA and genotype pending -HIV nonreactive -Continue supportive management/treatment with IV fluids, antiemetics scheduled, PPI  -Patient  initially placed on clear liquid diet and transitioned to regular diet -Gastroenterology consulted and recommended supportive care, decreased Klonopin -AST/ALT had slight bump, however, INR trending downward -1.58 (although patient receiving Vit K 06/05/14- 06/08/14) -Will continue to monitor CMP  Urinary Tract Infection -UA: WBC 21-50, positive nitrites, moderate leukocytes, few bacteria -Patient had complains of dysuria at admission -Urine culture shows insignificant growth -Continue cipro  Hypokalemia -Resolved, likely secondary to GI losses  Polysubstance abuse including THC and Tobacco  -Urine drug screen positive for marijuana -Patient counseled -Continue nicotine patch -patient has been leaving the floor to smoke -She was advised not to, and that if she continues, she will discharged  Anxiety Disorder -Continue Klonopin as needed  Chronic pain syndrome -Continue pain control with hydromorphone and oxycodone as needed  Code Status: Full  Family Communication: None at bedside  Disposition Plan: Admitted.  If LFTs improve, likely discharge 6/23  Time Spent in minutes   25 minutes  Procedures  None  Consults   Gastroenterology  DVT Prophylaxis  SCDs  Lab Results  Component Value Date   PLT 288 06/06/2014    Medications  Scheduled Meds: . ciprofloxacin  400 mg Intravenous  Q12H  . docusate sodium  100 mg Oral BID  .  HYDROmorphone (DILAUDID) injection  0.5 mg Intravenous 3 times per day  . nicotine  14 mg Transdermal Daily  . ondansetron  4 mg Oral TID AC & HS  . pantoprazole  40 mg Oral Daily  . phytonadione (VITAMIN K) IV  10 mg Intravenous Daily  . polyethylene glycol  17 g Oral Q breakfast   Continuous Infusions:   PRN Meds:.albuterol, clonazePAM, HYDROmorphone (DILAUDID) injection, ondansetron (ZOFRAN) IV, ondansetron, oxyCODONE  Antibiotics    Anti-infectives   Start     Dose/Rate Route Frequency Ordered Stop   06/04/14 1100  ciprofloxacin  (CIPRO) IVPB 400 mg     400 mg 200 mL/hr over 60 Minutes Intravenous Every 12 hours 06/04/14 0145     06/03/14 2330  ciprofloxacin (CIPRO) IVPB 400 mg     400 mg 200 mL/hr over 60 Minutes Intravenous  Once 06/03/14 2318 06/04/14 0103        Subjective:   Heidi Rose seen and examined today.  Patient continues to complain of abdominal pain and nausea.  Patient states that the pain medications helps some, but does not "get rid of the pain."  She has not vomited.  Patient would like to increase her klonopin.   Objective:   Filed Vitals:   06/05/14 0623 06/05/14 1425 06/05/14 2150 06/06/14 0544  BP: 132/79 107/69 101/50 100/63  Pulse: 100 90 71 63  Temp: 98.9 F (37.2 C) 98.3 F (36.8 C) 98.1 F (36.7 C) 97.3 F (36.3 C)  TempSrc: Oral Oral Axillary Oral  Resp: 20 20 20 20   Height:      Weight:      SpO2:  98% 100% 99%    Wt Readings from Last 3 Encounters:  06/04/14 56.972 kg (125 lb 9.6 oz)  06/01/14 58.968 kg (130 lb)  04/04/14 55.792 kg (123 lb)     Intake/Output Summary (Last 24 hours) at 06/06/14 1258 Last data filed at 06/06/14 0600  Gross per 24 hour  Intake 2296.25 ml  Output    900 ml  Net 1396.25 ml    Exam  General: Well developed, well nourished, NAD, appears stated age  HEENT: NCAT, mucous membranes moist.   Neck: Supple, no JVD, no masses  Cardiovascular: S1 S2 auscultated, no rubs, murmurs or gallops. Regular rate and rhythm.  Respiratory: Clear to auscultation bilaterally with equal chest rise  Abdomen: Soft, RUQ tenderness, nondistended, + bowel sounds, no guarding/rebound  Extremities: warm dry without cyanosis clubbing or edema  Neuro: AAOx3, no focal deficits  Skin: Without rashes exudates or nodules, jaundice, tattoos   Psych: Depressed mood, anxious affect  Data Review   Micro Results Recent Results (from the past 240 hour(s))  URINE CULTURE     Status: None   Collection Time    06/04/14  2:10 AM      Result Value Ref  Range Status   Specimen Description URINE, RANDOM   Final   Special Requests NONE   Final   Culture  Setup Time     Final   Value: 06/04/2014 22:35     Performed at [facility] Count     Final   Value: 5,000 COLONIES/ML     Performed at [facility]   Culture     Final   Value: INSIGNIFICANT GROWTH     Performed at [facility]   Report Status 06/05/2014 FINAL   Final  Radiology Reports Ct Abdomen Pelvis W Contrast  06/04/2014   CLINICAL DATA:  Nausea, vomiting, fatigue and abdominal pain. Constipation.  EXAM: CT ABDOMEN AND PELVIS WITH CONTRAST  TECHNIQUE: Multidetector CT imaging of the abdomen and pelvis was performed using the standard protocol following bolus administration of intravenous contrast.  CONTRAST:  100 mL of Omnipaque 300 IV contrast  COMPARISON:  Pelvic ultrasound performed 03/10/2010  FINDINGS: Minimal right basilar atelectasis is noted.  The liver and spleen are unremarkable in appearance. The gallbladder is within normal limits. The pancreas and adrenal glands are unremarkable.  The kidneys are unremarkable in appearance. There is no evidence of hydronephrosis. No renal or ureteral stones are seen. No perinephric stranding is appreciated.  No free fluid is identified. The small bowel is unremarkable in appearance. The stomach is within normal limits. No acute vascular abnormalities are seen.  The appendix is normal in caliber and contains air, without evidence for appendicitis. The colon is unremarkable in appearance.  The bladder is mildly distended and appears thick walled; would correlate for evidence of cystitis. The uterus is grossly unremarkable in appearance, with an intrauterine device in expected position at the fundus of the uterus. The ovaries are relatively symmetric; no suspicious adnexal masses are seen. No inguinal lymphadenopathy is seen.  No acute osseous abnormalities are identified.  IMPRESSION: 1. Mild bladder wall  thickening could reflect cystitis, particularly given the patient's urinalysis results. 2. Otherwise unremarkable CT of the abdomen and pelvis.   Electronically Signed   By: Roanna Raider M.D.   On: 06/04/2014 00:46    CBC  Recent Labs Lab 06/03/14 2223 06/04/14 0627 06/05/14 0639 06/06/14 0459  WBC 8.5 7.2 6.1 7.0  HGB 14.4 12.6 12.2 12.8  HCT 41.4 37.0 36.6 38.3  PLT 255 260 236 288  MCV 78.3 78.6 78.5 79.1  MCH 27.2 26.8 26.2 26.4  MCHC 34.8 34.1 33.3 33.4  RDW 15.0 15.1 15.4 15.7*  LYMPHSABS 1.8  --   --   --   MONOABS 0.8  --   --   --   EOSABS 0.1  --   --   --   BASOSABS 0.0  --   --   --     Chemistries   Recent Labs Lab 06/03/14 2223 06/04/14 0627 06/05/14 0639 06/06/14 0459  NA 137 138 136* 140  K 3.1* 3.7 4.1 4.6  CL 97 101 102 104  CO2 24 25 22 25   GLUCOSE 117* 123* 103* 85  BUN 6 4* 6 5*  CREATININE 0.56 0.48* 0.52 0.66  CALCIUM 8.7 8.3* 8.1* 8.3*  MG  --  1.8  --   --   AST 1398* 1096* 1054* 1240*  ALT 3295* 2734* 2087* 2106*  ALKPHOS 236* 209* 175* 191*  BILITOT 11.5*  11.5* 10.3* 10.4* 13.1*   ------------------------------------------------------------------------------------------------------------------ estimated creatinine clearance is 96.7 ml/min (by C-G formula based on Cr of 0.66). ------------------------------------------------------------------------------------------------------------------ No results found for this basename: HGBA1C,  in the last 72 hours ------------------------------------------------------------------------------------------------------------------ No results found for this basename: CHOL, HDL, LDLCALC, TRIG, CHOLHDL, LDLDIRECT,  in the last 72 hours ------------------------------------------------------------------------------------------------------------------  Recent Labs  06/04/14 0627  TSH 0.246*    ------------------------------------------------------------------------------------------------------------------ No results found for this basename: VITAMINB12, FOLATE, FERRITIN, TIBC, IRON, RETICCTPCT,  in the last 72 hours  Coagulation profile  Recent Labs Lab 06/03/14 2223 06/05/14 0639 06/06/14 0459  INR 1.52* 1.78* 1.58*    No results found for this basename: DDIMER,  in the last 72 hours  Cardiac Enzymes No results found for this basename: CK, CKMB, TROPONINI, MYOGLOBIN,  in the last 168 hours ------------------------------------------------------------------------------------------------------------------ No components found with this basename: POCBNP,     MIKHAIL, MARYANN D.O. on 06/06/2014 at 12:58 PM  Between 7am to 7pm - Pager - [phone]  After 7pm go to [email or web address] - password [email or web address]  And look for the night coverage person covering for me after hours  [facility] Office  [phone]

## 2014-06-06 NOTE — Progress Notes (Signed)
REVIEWED.  

## 2014-06-06 NOTE — Care Management Note (Addendum)
    Page 1 of 1   06/07/2014     10:37:08 AM CARE MANAGEMENT NOTE 06/07/2014  Patient:  Heidi [name],Heidi [name]   Account Number:  [id number]  Date Initiated:  06/06/2014  Documentation initiated by:  Rosemary HolmsOBSON,AMY  Subjective/Objective Assessment:   Pt admitted from home where she states she lives with her boyfriend. Independent. No HH/DME anticipated     Action/Plan:   Anticipated DC Date:  06/07/2014   Anticipated DC Plan:  HOME/SELF CARE      DC Planning Services  CM consult      Choice offered to / List presented to:             Status of service:  Completed, signed off Medicare Important Message given?   (If response is "NO", the following Medicare IM given date fields will be blank) Date Medicare IM given:   Date Additional Medicare IM given:    Discharge Disposition:    Per UR Regulation:    If discussed at Long Length of Stay Meetings, dates discussed:    Comments:  06/06/14 Rosemary HolmsAmy Robson RN CM Appt made with Dr. Felecia ShellingFanta, new pt/ hospital FU

## 2014-06-06 NOTE — Progress Notes (Signed)
    Subjective: Upper abdominal pain, straight through back. Medication takes "edge off" but not much. Nausea but no vomiting.   Objective: Vital signs in last 24 hours: Temp:  [97.3 F (36.3 C)-98.3 F (36.8 C)] 97.3 F (36.3 C) (06/22 0544) Pulse Rate:  [63-90] 63 (06/22 0544) Resp:  [20] 20 (06/22 0544) BP: (100-107)/(50-69) 100/63 mmHg (06/22 0544) SpO2:  [98 %-100 %] 99 % (06/22 0544) Last BM Date: 06/05/14 General:   Alert and oriented, pleasant, jaundice Eyes:  +scleral icterus Abdomen:  Bowel sounds present, soft, TTP upper abdomen, RUQ, non-distended.  Extremities:  Without  edema. Neurologic:  Alert and  oriented x4;  grossly normal neurologically. Skin:  Warm and dry, intact without significant lesions.  Psych:  Alert and cooperative. Normal mood and affect.  Intake/Output from previous day: 06/21 0701 - 06/22 0700 In: 2416.3 [P.O.:480; I.V.:1736.3; IV Piggyback:200] Out: 900 [Urine:900] Intake/Output this shift:    Lab Results:  Recent Labs  06/04/14 0627 06/05/14 0639 06/06/14 0459  WBC 7.2 6.1 7.0  HGB 12.6 12.2 12.8  HCT 37.0 36.6 38.3  PLT 260 236 288   BMET  Recent Labs  06/04/14 0627 06/05/14 0639 06/06/14 0459  NA 138 136* 140  K 3.7 4.1 4.6  CL 101 102 104  CO2 25 22 25   GLUCOSE 123* 103* 85  BUN 4* 6 5*  CREATININE 0.48* 0.52 0.66  CALCIUM 8.3* 8.1* 8.3*   LFT  Recent Labs  06/03/14 2223 06/04/14 0627 06/05/14 0639 06/06/14 0459  PROT 6.6 5.9* 5.1* 5.8*  ALBUMIN 3.4* 2.9* 2.5* 2.7*  AST 1398* 1096* 1054* 1240*  ALT 3295* 2734* 2087* 2106*  ALKPHOS 236* 209* 175* 191*  BILITOT 11.5*  11.5* 10.3* 10.4* 13.1*  BILIDIR 8.2*  --   --   --   IBILI 3.3*  --   --   --    PT/INR  Recent Labs  06/05/14 0639 06/06/14 0459  LABPROT 20.2* 18.4*  INR 1.78* 1.58*   Hepatitis Panel  Recent Labs  06/03/14 2240  HEPBSAG NEGATIVE  HCVAB Reactive*  HEPAIGM NON REACTIVE  HEPBIGM NON REACTIVE   Assessment: 28 year old  admitted with jaundice and RUQ pain due to acute Hepatitis C. LFTs fluctuating but overall still with improvement from admission. INR remains stable. Still with poor oral intake. Awaiting HCV RNA and genotype.   Plan: Follow LFTs Add PPI for GI prophylaxis Supportive care Vit K daily for 3 days, (through 24th) Zofran around the clock Await HCV RNA with genotype, repeat RNA in 12 weeks   Nira RetortAnna W. Sams, ANP-BC [facility]    LOS: 3 days    06/06/2014, 7:56 AM

## 2014-06-06 NOTE — Clinical Social Work Psychosocial (Signed)
Clinical Social Work Department BRIEF PSYCHOSOCIAL ASSESSMENT 06/06/2014  Patient:  Heidi [name], Heidi [name]     Account Number:  [id number]     Admit date:  06/03/2014  Clinical Social Worker:  Wyatt Haste  Date/Time:  06/06/2014 02:08 PM  Referred by:  Physician  Date Referred:  06/06/2014 Referred for  Substance Abuse   Other Referral:   Interview type:  Patient Other interview type:    PSYCHOSOCIAL DATA Living Status:  FAMILY Admitted from facility:   Level of care:   Primary support name:  Heidi [name] Primary support relationship to patient:  PARTNER Degree of support available:   supportive per pt    CURRENT CONCERNS Current Concerns  Substance Abuse   Other Concerns:    SOCIAL WORK ASSESSMENT / PLAN CSW met with pt at bedside following referral for substance use. Pt alert and oriented and reports she lives with her boyfriend, Heidi [name] and their children, ages 34, 2, and 1. She describes her best support as Heidi [name] and her mother. Pt's mother and other family are helping with children while pt is in hospital. She reports that she came to ED with nausea and vomiting. Admitted with acute hepatitis.  Discussed pt's history of substance use. Pt reports that she is a social drinker and very rarely drinks alcohol. She admits to past history of heroin use, but states it was only several times, with last use about a year ago. Although H&P documents frequent marijuana use, pt states it is "every once in a while." When asked how often this was, pt responded "one time a month." She minimizes issue and states this is not a problem for her. Pt reports she never smokes around children and that she has a babysitter. She has never been to any treatment in the past and does not feel it is necessary at this time. Pt admits she has struggled with prescription medication abuse. She said it has been awhile, but if pain medication is needed, she has decided that she will have someone dispense meds to her  appropriately. Pt states her children are most important to her and she doesn't want substance use to affect this relationship. CSW provided Effects of Marijuana brochure and Resource Guide with contact information for [facility] if needed.   Assessment/plan status:  Information/Referral to [employer] Other assessment/ plan:   Information/referral to community resources:   [employer] of Marijuana brochure    PATIENT'S/FAMILY'S RESPONSE TO PLAN OF CARE: Pt does not feel that substance use is a problem for her at this time. Accepted information provided by CSW. No other needs reported. CSW will sign off.       Heidi [name], [place]

## 2014-06-07 ENCOUNTER — Telehealth: Payer: Self-pay | Admitting: Gastroenterology

## 2014-06-07 ENCOUNTER — Other Ambulatory Visit: Payer: Self-pay

## 2014-06-07 DIAGNOSIS — R945 Abnormal results of liver function studies: Secondary | ICD-10-CM

## 2014-06-07 DIAGNOSIS — R7989 Other specified abnormal findings of blood chemistry: Secondary | ICD-10-CM

## 2014-06-07 LAB — COMPREHENSIVE METABOLIC PANEL
ALK PHOS: 190 U/L — AB (ref 39–117)
ALT: 1897 U/L — AB (ref 0–35)
AST: 1135 U/L — AB (ref 0–37)
Albumin: 2.9 g/dL — ABNORMAL LOW (ref 3.5–5.2)
BUN: 5 mg/dL — ABNORMAL LOW (ref 6–23)
CO2: 24 mEq/L (ref 19–32)
Calcium: 8.5 mg/dL (ref 8.4–10.5)
Chloride: 103 mEq/L (ref 96–112)
Creatinine, Ser: 0.61 mg/dL (ref 0.50–1.10)
GFR calc non Af Amer: >90 mL/min (ref >90)
Glucose, Bld: 101 mg/dL — ABNORMAL HIGH (ref 70–99)
POTASSIUM: 4 meq/L (ref 3.7–5.3)
SODIUM: 139 meq/L (ref 137–147)
TOTAL PROTEIN: 6.2 g/dL (ref 6.0–8.3)
Total Bilirubin: 14.1 mg/dL — ABNORMAL HIGH (ref 0.3–1.2)

## 2014-06-07 LAB — HCV RNA QUANT
HCV Quantitative Log: 5.44 {Log} — ABNORMAL HIGH (ref <1.18)
HCV Quantitative: 277755 IU/mL — ABNORMAL HIGH (ref <15)

## 2014-06-07 LAB — PROTIME-INR
INR: 1.41 (ref 0.00–1.49)
Prothrombin Time: 16.9 seconds — ABNORMAL HIGH (ref 11.6–15.2)

## 2014-06-07 MED ORDER — POLYETHYLENE GLYCOL 3350 17 G PO PACK: 17.0000 g | PACK | Freq: Every day | ORAL | Status: DC

## 2014-06-07 MED ORDER — OXYCODONE HCL 5 MG PO TABS: 5.0000 mg | ORAL_TABLET | Freq: Four times a day (QID) | ORAL | Status: DC | PRN

## 2014-06-07 MED ORDER — NICOTINE 14 MG/24HR TD PT24: 14.0000 mg | MEDICATED_PATCH | Freq: Every day | TRANSDERMAL | Status: DC

## 2014-06-07 MED ORDER — PHYTONADIONE 5 MG PO TABS
5.0000 mg | ORAL_TABLET | Freq: Once | ORAL | Status: AC
  Administered 2014-06-07: 5 mg via ORAL
  Filled 2014-06-07: qty 1

## 2014-06-07 MED ORDER — CIPROFLOXACIN HCL 250 MG PO TABS: 250.0000 mg | ORAL_TABLET | Freq: Two times a day (BID) | ORAL | Status: DC

## 2014-06-07 MED ORDER — ONDANSETRON HCL 4 MG PO TABS: 4.0000 mg | ORAL_TABLET | Freq: Three times a day (TID) | ORAL | Status: DC

## 2014-06-07 MED ORDER — PANTOPRAZOLE SODIUM 40 MG PO TBEC: 40.0000 mg | DELAYED_RELEASE_TABLET | Freq: Every day | ORAL | Status: DC

## 2014-06-07 MED ORDER — ONDANSETRON HCL 4 MG PO TABS: 4.0000 mg | ORAL_TABLET | Freq: Four times a day (QID) | ORAL | Status: DC | PRN

## 2014-06-07 NOTE — Telephone Encounter (Signed)
I called and informed pt. She said she is set up for Dr. Felecia ShellingFanta to be her PCP.

## 2014-06-07 NOTE — Progress Notes (Signed)
Pt had an episode of nausea and vomiting this AM. Emesis was thick, brown, and had pieces of undigested food in it. After vomiting pt stated her nausea felt "a little better, but not completely better". Will continue to monitor.

## 2014-06-07 NOTE — Discharge Summary (Signed)
Physician Discharge Summary  Heidi [name] ZOX:[id number] DOB: 10/02/1986 DOA: 06/03/2014  PCP: No PCP Per Patient  Admit date: 06/03/2014 Discharge date: 06/07/2014  Time spent: 45 minutes  Recommendations for Outpatient Follow-up:  The patient will be discharged to home. She is to follow up with the primary care physician, that appointment has been made for the patient. Patient is also to followup with gastroenterology within 2 weeks of discharge. Patient to continue taking her medications as prescribed. Patient should follow a regular diet and advance as tolerated. The patient to continue her medications as prescribed. Patient was counseled on the need to refrain from nicotine as well as illicit drugs. Patient was also counseled on safe practices.  Discharge Diagnoses:  Principal Problem:   Acute hepatitis Active Problems:   Cholestatic jaundice   Cystitis, acute   Chronic pain syndrome   Hypokalemia   Polysubstance abuse   Hepatitis C, acute   Discharge Condition: Stable  Diet recommendation: Regular  Filed Weights   06/03/14 2211 06/04/14 0207  Weight: 60.017 kg (132 lb 5 oz) 56.972 kg (125 lb 9.6 oz)    History of present illness:  Heidi [name] is a 28 y.o. female with a past medical history of chronic pain in her right shoulder and upper back, anxiety disorder, who was in her usual state of health till about 5 days ago, when she started having nausea and had a few episodes of vomiting. She felt fatigued. She denies any blood in the emesis. She has not had a bowel movement in a week. She also started having pain in her upper abdomen on and off, sharp pain, 8/10 in intensity without any radiation. She takes Klonopin, oxycodone, and has taken Phenergan suppositories recently for her nausea. Denied taking Tylenol. Rarely drinks alcohol. She admits to IV drug use about 6 months ago, but nothing recently. She does have tattoos on her body done over the last year or so. Denied any  recent travel or any sick contacts. She actually presented to the emergency department 2 days ago, with similar complaints. She was treated symptomatically and was discharged home. Her symptoms persisted, and then her eyes turned yellow yesterday and so, she decided to come in to the hospital. She is not a very good historian. Patient kept texting on her phone while the admitting physician was speaking to her.  Hospital Course:  Intractable Abdominal pain, nausea, vomiting secondary to Cholestatic jaundice/Elevated LFTs  -CT abd/pelvis ?cystitis, otherwise unremarkable  -hepatitis panel positive for Hepatitis C Ab  -HCV RNA [id number], pending genotype -Pending autoimmune workup as well -HIV nonreactive  -Continue supportive management/treatment, antiemetics scheduled, PPI  -Patient initially placed on clear liquid diet and transitioned to regular diet  -Gastroenterology consulted and recommended supportive care, decreased Klonopin  -AST/ALT trending downward, INR 1.41 (trending downward, but receiving Vit K (6/21- 6/24) -Patient will be discharged to home with a limited amount of pain medications, she was instructed to avoid Tylenol, as well as illicit drugs- marijuana and IV drugs as well.   -Patient was also instructed to practice "safe intercourse" given her Hep C. -Patient will need to followup with gastroenterology in 2 weeks; this appointment will be arranged by GI  Urinary Tract Infection  -UA: WBC 21-50, positive nitrites, moderate leukocytes, few bacteria  -Patient had complains of dysuria at admission  -Urine culture shows insignificant growth  -Continue cipro   Hypokalemia  -Resolved, likely secondary to GI losses   Polysubstance abuse including THC and Tobacco  -Urine drug  screen positive for marijuana  -Patient counseled  -Continue nicotine patch  -patient has been leaving the floor to smoke  -She was advised not to, and that if she continues, she will discharged   Anxiety  Disorder  -Continue Klonopin as needed   Chronic pain syndrome  -Continue pain control with hydromorphone and oxycodone as needed  Procedures: None  Consultations: Gastroenterology  Discharge Exam: Filed Vitals:   06/06/14 2041  BP: 102/61  Pulse: 78  Temp: 98.1 F (36.7 C)  Resp: 20    Exam  General: Well developed, well nourished, NAD, appears stated age  HEENT: NCAT, mucous membranes moist.  Neck: Supple, no JVD, no masses  Cardiovascular: S1 S2 auscultated, no rubs, murmurs or gallops. Regular rate and rhythm.  Respiratory: Clear to auscultation bilaterally with equal chest rise  Abdomen: Soft, RUQ tenderness, nondistended, + bowel sounds, no guarding/rebound  Extremities: warm dry without cyanosis clubbing or edema  Neuro: AAOx3, no focal deficits  Skin: Without rashes exudates or nodules, jaundice, tattoos  Psych: Normal affect   Discharge Instructions      Discharge Instructions   Diet general    Complete by:  As directed      Discharge instructions    Complete by:  As directed   The patient will be discharged to home. She is to follow up with the primary care physician, that appointment has been made for the patient. Patient is also to followup with gastroenterology within 2 weeks of discharge. Patient to continue taking her medications as prescribed. Patient should follow a regular diet and advance as tolerated. The patient to continue her medications as prescribed. Patient was counseled on the need to refrain from nicotine as well as illicit drugs. Patient was also counseled on safe practices.     Increase activity slowly    Complete by:  As directed             Medication List    STOP taking these medications       promethazine 25 MG suppository  Commonly known as:  PHENERGAN      TAKE these medications       ciprofloxacin 250 MG tablet  Commonly known as:  CIPRO  Take 1 tablet (250 mg total) by mouth 2 (two) times daily.     clonazePAM 1 MG  tablet  Commonly known as:  KLONOPIN  Take 1 mg by mouth 3 (three) times daily as needed. For anxiety     nicotine 14 mg/24hr patch  Commonly known as:  NICODERM CQ - dosed in mg/24 hours  Place 1 patch (14 mg total) onto the skin daily.     ondansetron 4 MG tablet  Commonly known as:  ZOFRAN  Take 1 tablet (4 mg total) by mouth every 6 (six) hours as needed for nausea.     ondansetron 4 MG tablet  Commonly known as:  ZOFRAN  Take 1 tablet (4 mg total) by mouth 4 (four) times daily -  before meals and at bedtime.     oxyCODONE 5 MG immediate release tablet  Commonly known as:  ROXICODONE  Take 1 tablet (5 mg total) by mouth every 6 (six) hours as needed for severe pain.     pantoprazole 40 MG tablet  Commonly known as:  PROTONIX  Take 1 tablet (40 mg total) by mouth daily.     polyethylene glycol packet  Commonly known as:  MIRALAX / GLYCOLAX  Take 17 g by mouth daily with breakfast.  Allergies  Allergen Reactions  . Penicillins Hives and Other (See Comments)    headaches  . Tylenol [Acetaminophen]   . Venlafaxine Hives and Other (See Comments)    Headaches    . Morphine And Related Hives and Other (See Comments)    Reaction: headaches   Follow-up Information   [facility]2014. (@ 10 am, bring Medicaid card)    Specialty:  Internal Medicine   Contact information:   [address] [place] [address] [phone]       Follow up with Jonette EvaSandi Fields, MD. Call in 2 weeks. [facility](Hospital followup)    Specialty:  Gastroenterology   Contact information:   [address] [place] [address] [phone]        The results of significant diagnostics from this hospitalization (including imaging, microbiology, ancillary and laboratory) are listed below for reference.    Significant Diagnostic Studies: Ct Abdomen Pelvis W Contrast  06/04/2014   CLINICAL DATA:  Nausea, vomiting, fatigue and  abdominal pain. Constipation.  EXAM: CT ABDOMEN AND PELVIS WITH CONTRAST  TECHNIQUE: Multidetector CT imaging of the abdomen and pelvis was performed using the standard protocol following bolus administration of intravenous contrast.  CONTRAST:  100 mL of Omnipaque 300 IV contrast  COMPARISON:  Pelvic ultrasound performed 03/10/2010  FINDINGS: Minimal right basilar atelectasis is noted.  The liver and spleen are unremarkable in appearance. The gallbladder is within normal limits. The pancreas and adrenal glands are unremarkable.  The kidneys are unremarkable in appearance. There is no evidence of hydronephrosis. No renal or ureteral stones are seen. No perinephric stranding is appreciated.  No free fluid is identified. The small bowel is unremarkable in appearance. The stomach is within normal limits. No acute vascular abnormalities are seen.  The appendix is normal in caliber and contains air, without evidence for appendicitis. The colon is unremarkable in appearance.  The bladder is mildly distended and appears thick walled; would correlate for evidence of cystitis. The uterus is grossly unremarkable in appearance, with an intrauterine device in expected position at the fundus of the uterus. The ovaries are relatively symmetric; no suspicious adnexal masses are seen. No inguinal lymphadenopathy is seen.  No acute osseous abnormalities are identified.  IMPRESSION: 1. Mild bladder wall thickening could reflect cystitis, particularly given the patient's urinalysis results. 2. Otherwise unremarkable CT of the abdomen and pelvis.   Electronically Signed   By: Roanna RaiderJeffery  Chang M.D.   On: 06/04/2014 00:46    Microbiology: Recent Results (from the past 240 hour(s))  URINE CULTURE     Status: None   Collection Time    06/04/14  2:10 AM      Result Value Ref Range Status   Specimen Description URINE, RANDOM   Final   Special Requests NONE   Final   Culture  Setup Time     Final   Value: 06/04/2014 22:35      [facility]formed at [facility]   Colony Count     Final   Value: 5,000 COLONIES/ML     [facility]   Culture     Final   Value: INSIGNIFICANT GROWTH     [facility]formed at [facility]   Report Status 06/05/2014 FINAL   Final     Labs: Basic Metabolic Panel:  Recent Labs Lab 06/03/14 2223 06/04/14 0627 06/05/14 0639 06/06/14 0459 06/07/14 0536  NA 137 138 136* 140 139  K 3.1* 3.7 4.1 4.6 4.0  CL 97 101 102 104 103  CO2 24 25 22 25 24   GLUCOSE 117* 123* 103* 85 101*  BUN 6 4* 6 5* 5*  CREATININE 0.56 0.48* 0.52 0.66 0.61  CALCIUM 8.7 8.3* 8.1* 8.3* 8.5  MG  --  1.8  --   --   --    Liver Function Tests:  Recent Labs Lab 06/03/14 2223 06/04/14 0627 06/05/14 0639 06/06/14 0459 06/07/14 0536  AST 1398* 1096* 1054* 1240* 1135*  ALT 3295* 2734* 2087* 2106* 1897*  ALKPHOS 236* 209* 175* 191* 190*  BILITOT 11.5*  11.5* 10.3* 10.4* 13.1* 14.1*  PROT 6.6 5.9* 5.1* 5.8* 6.2  ALBUMIN 3.4* 2.9* 2.5* 2.7* 2.9*    Recent Labs Lab 06/03/14 2223  LIPASE 25   No results found for this basename: AMMONIA,  in the last 168 hours CBC:  Recent Labs Lab 06/03/14 2223 06/04/14 0627 06/05/14 0639 06/06/14 0459  WBC 8.5 7.2 6.1 7.0  NEUTROABS 5.8  --   --   --   HGB 14.4 12.6 12.2 12.8  HCT 41.4 37.0 36.6 38.3  MCV 78.3 78.6 78.5 79.1  PLT 255 260 236 288   Cardiac Enzymes: No results found for this basename: CKTOTAL, CKMB, CKMBINDEX, TROPONINI,  in the last 168 hours BNP: BNP (last 3 results) No results found for this basename: PROBNP,  in the last 8760 hours CBG: No results found for this basename: GLUCAP,  in the last 168 hours     Signed:  Edsel Petrin  [facility] 06/07/2014, 10:46 AM

## 2014-06-07 NOTE — Progress Notes (Signed)
    Subjective: Eating well. Nursing states eats some from hospital tray and supplements with outside food (ramen noodles). Some nausea, no vomiting. Abdominal discomfort at baseline. No confusion, mental status changes. Wants to go home.   Objective: Vital signs in last 24 hours: Temp:  [98.1 F (36.7 C)-98.2 F (36.8 C)] 98.1 F (36.7 C) (06/22 2041) Pulse Rate:  [67-78] 78 (06/22 2041) Resp:  [20] 20 (06/22 2041) BP: (102-105)/(61-62) 102/61 mmHg (06/22 2041) SpO2:  [99 %-100 %] 99 % (06/22 2041) Last BM Date: 06/05/14 General:   Alert and oriented, pleasant, jaundiced Head:  Normocephalic and atraumatic. Eyes:  +scleral icterus Mouth:  Without lesions, mucosa pink and moist.  Abdomen:  Bowel sounds present, soft, TTP RUQ, non-distended. No HSM or hernias noted. Extremities:  Without  edema. Neurologic:  Alert and  oriented x4;  grossly normal neurologically. Skin:  Warm and dry, intact without significant lesions.  Psych:  Alert and cooperative. Normal mood and affect.  Intake/Output from previous day: 06/22 0701 - 06/23 0700 In: 900 [P.O.:600; IV Piggyback:300] Out: 800 [Urine:800] Intake/Output this shift:    Lab Results:  Recent Labs  06/05/14 0639 06/06/14 0459  WBC 6.1 7.0  HGB 12.2 12.8  HCT 36.6 38.3  PLT 236 288   BMET  Recent Labs  06/05/14 0639 06/06/14 0459 06/07/14 0536  NA 136* 140 139  K 4.1 4.6 4.0  CL 102 104 103  CO2 22 25 24   GLUCOSE 103* 85 101*  BUN 6 5* 5*  CREATININE 0.52 0.66 0.61  CALCIUM 8.1* 8.3* 8.5   LFT  Recent Labs  06/05/14 0639 06/06/14 0459 06/07/14 0536  PROT 5.1* 5.8* 6.2  ALBUMIN 2.5* 2.7* 2.9*  AST 1054* 1240* 1135*  ALT 2087* 2106* 1897*  ALKPHOS 175* 191* 190*  BILITOT 10.4* 13.1* 14.1*   PT/INR  Recent Labs  06/06/14 0459 06/07/14 0536  LABPROT 18.4* 16.9*  INR 1.58* 1.41    Assessment: 28 year old admitted with jaundice and RUQ pain due to acute Hepatitis C. LFTs fluctuating as expected  but overall still with improvement in transaminases from admission. INR normalized with Vit K. Awaiting HCV RNA and genotype. Clinically stable, tolerating diet. Appropriate for discharge home with close follow-up as outpatient.   Plan: Follow LFTs PPI Supportive care Zofran scheduled HCV RNA with genotype pending; repeat in 12 weeks Return to our office in 2 weeks, recheck LFTs in 1 week as outpatient. We will arrange  Nira RetortAnna W. Sams, ANP-BC [facility]    LOS: 4 days    06/07/2014, 7:45 AM   Attending note:  Patient discharged prior to my seeing her today. As discussed with Gerrit HallsAnna Sams, would very much like to see the results of her HCV PCR. We need to also keep in mind other possibilities including autoimmune hepatitis. Agree with drawing autoimmune markers as part of her evaluation.

## 2014-06-07 NOTE — Telephone Encounter (Signed)
LMOM to call. Lab order faxed to [facility].  

## 2014-06-07 NOTE — Telephone Encounter (Signed)
NO. Does not need OV in next few days. She will be fine to wait 2 weeks.  Definitely needs labs done next week.  She was purposefully only given small amount of narcotics at discharge.  Needs to obtain a PCP if she does not have one already.

## 2014-06-07 NOTE — Telephone Encounter (Signed)
Patient needs to be seen by me in 2 weeks in our office; may put in an urgent slot if needed.    Needs HFP on this upcoming Monday as outpatient; [facility], no visit needed until 2 weeks out.

## 2014-06-07 NOTE — Telephone Encounter (Signed)
Pt called and was informed. I told her that Darl PikesSusan would schedule her appt. She wanted to have ov in the next couple of days because she said she was not given many pain pills. Please advise!

## 2014-06-07 NOTE — Progress Notes (Signed)
Pt is to be discharged home today. Pt is in NAD, IV is out, all paperwork has been reviewed/discussed with patient, and there are no questions/concerns at this time. Assessment is unchanged from this morning. Pt is to be accompanied downstairs by staff and family via wheelchair.  

## 2014-06-07 NOTE — Telephone Encounter (Signed)
Pt has OV already scheduled to follow up with [facility]. Do I need to cancel that and [facility] with [facility] in 2 weeks. Please advise.

## 2014-06-07 NOTE — Discharge Instructions (Signed)
Nicotine Addiction Nicotine can act as both a stimulant (excites/activates) and a sedative (calms/quiets). Immediately after exposure to nicotine, there is a "kick" caused in part by the drug's stimulation of the adrenal glands and resulting discharge of adrenaline (epinephrine). The rush of adrenaline stimulates the body and causes a sudden release of sugar. This means that smokers are always slightly hyperglycemic. Hyperglycemic means that the blood sugar is high, just like in diabetics. Nicotine also decreases the amount of insulin which helps control sugar levels in the body. There is an increase in blood pressure, breathing, and the rate of heart beats.  In addition, nicotine indirectly causes a release of dopamine in the brain that controls pleasure and motivation. A similar reaction is seen with other drugs of abuse, such as cocaine and heroin. This dopamine release is thought to cause the pleasurable sensations when smoking. In some different cases, nicotine can also create a calming effect, depending on sensitivity of the smoker's nervous system and the dose of nicotine taken. WHAT HAPPENS WHEN NICOTINE IS TAKEN FOR LONG PERIODS OF TIME?  Long-term use of nicotine results in addiction. It is difficult to stop.  Repeated use of nicotine creates tolerance. Higher doses of nicotine are needed to get the "kick." When nicotine use is stopped, withdrawal may last a month or more. Withdrawal may begin within a few hours after the last cigarette. Symptoms peak within the first few days and may lessen within a few weeks. For some people, however, symptoms may last for months or longer. Withdrawal symptoms include:   Irritability.  Craving.  Learning and attention deficits.  Sleep disturbances.  Increased appetite. Craving for tobacco may last for 6 months or longer. Many behaviors done while using nicotine can also play a part in the severity of withdrawal symptoms. For some people, the feel,  smell, and sight of a cigarette and the ritual of obtaining, handling, lighting, and smoking the cigarette are closely linked with the pleasure of smoking. When stopped, they also miss the related behaviors which make the withdrawal or craving worse. While nicotine gum and patches may lessen the drug aspects of withdrawal, cravings often persist. WHAT ARE THE MEDICAL CONSEQUENCES OF NICOTINE USE?  Nicotine addiction accounts for one-third of all cancers. The top cancer caused by tobacco is lung cancer. Lung cancer is the number one cancer killer of both men and women.  Smoking is also associated with cancers of the:  Mouth.  Pharynx.  Larynx.  Esophagus.  Stomach.  Pancreas.  Cervix.  Kidney.  Ureter.  Bladder.  Smoking also causes lung diseases such as lasting (chronic) bronchitis and emphysema.  It worsens asthma in adults and children.  Smoking increases the risk of heart disease, including:  Stroke.  Heart attack.  Vascular disease.  Aneurysm.  Passive or secondary smoke can also increase medical risks including:  Asthma in children.  Sudden Infant Death Syndrome (SIDS).  Additionally, dropped cigarettes are the leading cause of residential fire fatalities.  Nicotine poisoning has been reported from accidental ingestion of tobacco products by children and pets. Death usually results in a few minutes from respiratory failure (when a person stops breathing) caused by paralysis. TREATMENT   Medication. Nicotine replacement medicines such as nicotine gum and the patch are used to stop smoking. These medicines gradually lower the dosage of nicotine in the body. These medicines do not contain the carbon monoxide and other toxins found in tobacco smoke.  Hypnotherapy.  Relaxation therapy.  Nicotine Anonymous (a 12-step support  program). Find times and locations in your local yellow pages. Document Released: 08/07/2004 Document Revised: 02/24/2012 Document  Reviewed: 12/30/2007 [facility] Patient Information 2015 [place], [place]. This information is not intended to replace advice given to you by your health care provider. Make sure you discuss any questions you have with your health care provider.  Polysubstance Abuse When people abuse more than one drug or type of drug it is called polysubstance or polydrug abuse. For example, many smokers also drink alcohol. This is one form of polydrug abuse. Polydrug abuse also refers to the use of a drug to counteract an unpleasant effect produced by another drug. It may also be used to help with withdrawal from another drug. People who take stimulants may become agitated. Sometimes this agitation is countered with a tranquilizer. This helps protect against the unpleasant side effects. Polydrug abuse also refers to the use of different drugs at the same time.  Anytime drug use is interfering with normal living activities, it has become abuse. This includes problems with family and friends. Psychological dependence has developed when your mind tells you that the drug is needed. This is usually followed by physical dependence which has developed when continuing increases of drug are required to get the same feeling or "high". This is known as addiction or chemical dependency. A person's risk is much higher if there is a history of chemical dependency in the family. SIGNS OF CHEMICAL DEPENDENCY  You have been told by friends or family that drugs have become a problem.  You fight when using drugs.  You are having blackouts (not remembering what you do while using).  You feel sick from using drugs but continue using.  You lie about use or amounts of drugs (chemicals) used.  You need chemicals to get you going.  You are suffering in work performance or in school because of drug use.  You get sick from use of drugs but continue to use anyway.  You need drugs to relate to people or feel comfortable in social  situations.  You use drugs to forget problems. "Yes" answered to any of the above signs of chemical dependency indicates there are problems. The longer the use of drugs continues, the greater the problems will become. If there is a family history of drug or alcohol use, it is best not to experiment with these drugs. Continual use leads to tolerance. After tolerance develops more of the drug is needed to get the same feeling. This is followed by addiction. With addiction, drugs become the most important part of life. It becomes more important to take drugs than participate in the other usual activities of life. This includes relating to friends and family. Addiction is followed by dependency. Dependency is a condition where drugs are now needed not just to get high, but to feel normal. Addiction cannot be cured but it can be stopped. This often requires outside help and the care of professionals. Treatment centers are listed in the yellow pages under: Cocaine, Narcotics, and Alcoholics Anonymous. Most hospitals and clinics can refer you to a specialized care center. Talk to your caregiver if you need help. Document Released: 07/24/2005 Document Revised: 02/24/2012 Document Reviewed: 12/02/2005 [facility] Patient Information 2015 [place], [place]. This information is not intended to replace advice given to you by your health care provider. Make sure you discuss any questions you have with your health care provider. Hepatitis C Hepatitis C is a viral infection of the liver. Infection may go undetected for months or  years because symptoms may be absent or very mild. Chronic liver disease is the main danger of hepatitis C. This may lead to scarring of the liver (cirrhosis), liver failure, and liver cancer. CAUSES  Hepatitis C is caused by the hepatitis C virus (HCV). Formerly, hepatitis C infections were most commonly transmitted through blood transfusions. In the early 1990s, routine testing of donated blood for  hepatitis C and exclusion of blood that tests positive for HCV began. Now, HCV is most commonly transmitted from person to person through injection drug use, sharing needles, or sex with an infected person. A caregiver may also get the infection from exposure to the blood of an infected patient by way of a cut or needle stick.  SYMPTOMS  Acute Phase Many cases of acute HCV infection are mild and cause few problems.Some people may not even realize they are sick.Symptoms in others may last a few weeks to several months and include:  Feeling very tired.  Loss of appetite.  Nausea.  Vomiting.  Abdominal pain.  Dark yellow urine.  Yellow skin and eyes (jaundice).  Itching of the skin. Chronic Phase  Between 50% to 85% of people who get HCV infection become "chronic carriers." They often have no symptoms, but the virus stays in their body.They may spread the virus to others and can get long-term liver disease.  Many people with chronic HCV infection remain healthy for many years. However, up to 1 in 5 chronically infected people may develop severe liver diseases including scarring of the liver (cirrhosis), liver failure, or liver cancer. DIAGNOSIS  Diagnosis of hepatitis C infection is made by testing blood for the presence of hepatitis C viral particles called RNA. Other tests may also be done to measure the status of current liver function, exclude other liver problems, or assess liver damage. TREATMENT  Treatment with many antiviral drugs is available and recommended for some patients with chronic HCV infection. Drug treatment is generally considered appropriate for patients who:  Are 26 years of age or older.  Have a positive test for HCV particles in the blood.  Have a liver tissue sample (biopsy) that shows chronic hepatitis and significant scarring (fibrosis).  Do not have signs of liver failure.  Have acceptable blood test results that confirm the wellness of other body  organs.  Are willing to be treated and conform to treatment requirements.  Have no other circumstances that would prevent treatment from being recommended (contraindications). All people who are offered and choose to receive drug treatment must understand that careful medical follow up for many months and even years is crucial in order to make successful care possible. The goal of drug treatment is to eliminate any evidence of HCV in the blood on a long-term basis. This is called a "sustained virologic response" or SVR. Achieving a SVR is associated with a decrease in the chance of life-threatening liver problems, need for a liver transplant, liver cancer rates, and liver-related complications. Successful treatment currently requires taking treatment drugs for at least 24 weeks and up to 72 weeks. An injected drug (interferon) given weekly and an oral antiviral medicine taken daily are usually prescribed. Side effects from these drugs are common and some may be very serious. Your response to treatment must be carefully monitored by both you and your caregiver throughout the entire treatment period. PREVENTION There is no vaccine for hepatitis C. The only way to prevent the disease is to reduce the risk of exposure to the virus.  Avoid sharing drug needles or personal items like toothbrushes, razors, and nail clippers with an infected person.  Healthcare workers need to avoid injuries and wear appropriate protective equipment such as gloves, gowns, and face masks when performing invasive medical or nursing procedures. HOME CARE INSTRUCTIONS  To avoid making your liver disease worse:  Strictly avoid drinking alcohol.  Carefully review all new prescriptions of medicines with your caregiver. Ask your caregiver which drugs you should avoid. The following drugs are toxic to the liver, and your caregiver may tell you to avoid them:  Isoniazid.  Methyldopa.  Acetaminophen.  Anabolic steroids  (muscle-building drugs).  Erythromycin.  Oral contraceptives (birth control pills).  Check with your caregiver to make sure medicine you are currently taking will not be harmful.  Periodic blood tests may be required. Follow your caregiver's advice about when you should have blood tests.  Avoid a sexual relationship until advised otherwise by your caregiver.  Avoid activities that could expose other people to your blood. Examples include sharing a toothbrush, nail clippers, razors, and needles.  Bed rest is not necessary, but it may make you feel better. Recovery time is not related to the amount of rest you receive.  This infection is contagious. Follow your caregiver's instructions in order to avoid spread of the infection. SEEK IMMEDIATE MEDICAL CARE IF:  You have increasing fatigue or weakness.  You have an oral temperature above 102 F (38.9 C), not controlled by medicine.  You develop loss of appetite, nausea, or vomiting.  You develop jaundice.  You develop easy bruising or bleeding.  You develop any severe problems as a result of your treatment. MAKE SURE YOU:   Understand these instructions.  Will watch your condition.  Will get help right away if you are not doing well or get worse. Document Released: 11/29/2000 Document Revised: 02/24/2012 Document Reviewed: 04/03/2011 [facility] Patient Information 2015 [place], [place]. This information is not intended to replace advice given to you by your health care provider. Make sure you discuss any questions you have with your health care provider.

## 2014-06-08 LAB — ANA: Anti Nuclear Antibody(ANA): NEGATIVE

## 2014-06-08 LAB — HEPATITIS C GENOTYPE

## 2014-06-08 NOTE — Telephone Encounter (Signed)
HELP APPT WITH [facility] FOR 2 WEEKS.

## 2014-06-08 NOTE — Telephone Encounter (Signed)
Pt is aware of OV with [facility] on 78 and then again with [facility] on 722. appt card mailed

## 2014-06-08 NOTE — Telephone Encounter (Signed)
KEEP APPT FOR 7/22 AS WELL.

## 2014-06-09 LAB — ANTI-SMOOTH MUSCLE ANTIBODY, IGG: F-ACTIN AB IGG: 14 U (ref <20)

## 2014-06-09 LAB — MITOCHONDRIAL ANTIBODIES: Mitochondrial M2 Ab, IgG: 0.69 (ref <0.91)

## 2014-06-14 ENCOUNTER — Encounter: Payer: Self-pay | Admitting: Gastroenterology

## 2014-06-14 ENCOUNTER — Ambulatory Visit (INDEPENDENT_AMBULATORY_CARE_PROVIDER_SITE_OTHER): Payer: Medicaid Other | Admitting: Gastroenterology

## 2014-06-14 VITALS — BP 109/75 | HR 48 | Temp 98.0°F | Resp 18 | Ht 67.0 in | Wt 126.0 lb

## 2014-06-14 DIAGNOSIS — R7989 Other specified abnormal findings of blood chemistry: Secondary | ICD-10-CM | POA: Insufficient documentation

## 2014-06-14 DIAGNOSIS — R945 Abnormal results of liver function studies: Principal | ICD-10-CM

## 2014-06-14 LAB — HEPATIC FUNCTION PANEL
ALT: 383 U/L — AB (ref 0–35)
AST: 182 U/L — ABNORMAL HIGH (ref 0–37)
Albumin: 4.1 g/dL (ref 3.5–5.2)
Alkaline Phosphatase: 111 U/L (ref 39–117)
Bilirubin, Direct: 3.2 mg/dL — ABNORMAL HIGH (ref 0.0–0.3)
Indirect Bilirubin: 3 mg/dL — ABNORMAL HIGH (ref 0.2–1.2)
Total Bilirubin: 6.2 mg/dL — ABNORMAL HIGH (ref 0.2–1.2)
Total Protein: 6.7 g/dL (ref 6.0–8.3)

## 2014-06-14 MED ORDER — OXYCODONE HCL 5 MG PO TABS: 5.0000 mg | ORAL_TABLET | Freq: Four times a day (QID) | ORAL | Status: DC | PRN

## 2014-06-14 NOTE — Assessment & Plan Note (Signed)
With Hep C antibody positive and +RNA; acute Hep C. Improved since discharge. Clinically improving; still with some abdominal discomfort, requesting small amount of narcotics. Stepfather present. Patient with history of substance abuse. Stepfather has verbally stated he will hold medications and only provide to patient when needed. Likely does have some pain that could be attributed to recent acute illness; I informed her I would provide #20 additional tablets but NO MORE after this visit. Return in 3 months and recheck RNA at that point. Treatment may be needed in future if patient does not clear virus on own.

## 2014-06-14 NOTE — Patient Instructions (Signed)
We will see you back in 3 months.   Please have blood work done today.   Hepatitis C Hepatitis C is a viral infection of the liver. Infection may go undetected for months or years because symptoms may be absent or very mild. Chronic liver disease is the main danger of hepatitis C. This may lead to scarring of the liver (cirrhosis), liver failure, and liver cancer. CAUSES  Hepatitis C is caused by the hepatitis C virus (HCV). Formerly, hepatitis C infections were most commonly transmitted through blood transfusions. In the early 1990s, routine testing of donated blood for hepatitis C and exclusion of blood that tests positive for HCV began. Now, HCV is most commonly transmitted from person to person through injection drug use, sharing needles, or sex with an infected person. A caregiver may also get the infection from exposure to the blood of an infected patient by way of a cut or needle stick.  SYMPTOMS  Acute Phase Many cases of acute HCV infection are mild and cause few problems.Some people may not even realize they are sick.Symptoms in others may last a few weeks to several months and include:  Feeling very tired.  Loss of appetite.  Nausea.  Vomiting.  Abdominal pain.  Dark yellow urine.  Yellow skin and eyes (jaundice).  Itching of the skin. Chronic Phase  Between 50% to 85% of people who get HCV infection become "chronic carriers." They often have no symptoms, but the virus stays in their body.They may spread the virus to others and can get long-term liver disease.  Many people with chronic HCV infection remain healthy for many years. However, up to 1 in 5 chronically infected people may develop severe liver diseases including scarring of the liver (cirrhosis), liver failure, or liver cancer. DIAGNOSIS  Diagnosis of hepatitis C infection is made by testing blood for the presence of hepatitis C viral particles called RNA. Other tests may also be done to measure the status of  current liver function, exclude other liver problems, or assess liver damage. TREATMENT  Treatment with many antiviral drugs is available and recommended for some patients with chronic HCV infection. Drug treatment is generally considered appropriate for patients who:  Are 26 years of age or older.  Have a positive test for HCV particles in the blood.  Have a liver tissue sample (biopsy) that shows chronic hepatitis and significant scarring (fibrosis).  Do not have signs of liver failure.  Have acceptable blood test results that confirm the wellness of other body organs.  Are willing to be treated and conform to treatment requirements.  Have no other circumstances that would prevent treatment from being recommended (contraindications). All people who are offered and choose to receive drug treatment must understand that careful medical follow up for many months and even years is crucial in order to make successful care possible. The goal of drug treatment is to eliminate any evidence of HCV in the blood on a long-term basis. This is called a "sustained virologic response" or SVR. Achieving a SVR is associated with a decrease in the chance of life-threatening liver problems, need for a liver transplant, liver cancer rates, and liver-related complications. Successful treatment currently requires taking treatment drugs for at least 24 weeks and up to 72 weeks. An injected drug (interferon) given weekly and an oral antiviral medicine taken daily are usually prescribed. Side effects from these drugs are common and some may be very serious. Your response to treatment must be carefully monitored by both you  and your caregiver throughout the entire treatment period. PREVENTION There is no vaccine for hepatitis C. The only way to prevent the disease is to reduce the risk of exposure to the virus.   Avoid sharing drug needles or personal items like toothbrushes, razors, and nail clippers with an infected  person.  Healthcare workers need to avoid injuries and wear appropriate protective equipment such as gloves, gowns, and face masks when performing invasive medical or nursing procedures. HOME CARE INSTRUCTIONS  To avoid making your liver disease worse:  Strictly avoid drinking alcohol.  Carefully review all new prescriptions of medicines with your caregiver. Ask your caregiver which drugs you should avoid. The following drugs are toxic to the liver, and your caregiver may tell you to avoid them:  Isoniazid.  Methyldopa.  Acetaminophen.  Anabolic steroids (muscle-building drugs).  Erythromycin.  Oral contraceptives (birth control pills).  Check with your caregiver to make sure medicine you are currently taking will not be harmful.  Periodic blood tests may be required. Follow your caregiver's advice about when you should have blood tests.  Avoid a sexual relationship until advised otherwise by your caregiver.  Avoid activities that could expose other people to your blood. Examples include sharing a toothbrush, nail clippers, razors, and needles.  Bed rest is not necessary, but it may make you feel better. Recovery time is not related to the amount of rest you receive.  This infection is contagious. Follow your caregiver's instructions in order to avoid spread of the infection. SEEK IMMEDIATE MEDICAL CARE IF:  You have increasing fatigue or weakness.  You have an oral temperature above 102 F (38.9 C), not controlled by medicine.  You develop loss of appetite, nausea, or vomiting.  You develop jaundice.  You develop easy bruising or bleeding.  You develop any severe problems as a result of your treatment. MAKE SURE YOU:   Understand these instructions.  Will watch your condition.  Will get help right away if you are not doing well or get worse. Document Released: 11/29/2000 Document Revised: 02/24/2012 Document Reviewed: 04/03/2011 [facility] Patient Information  2015 [place], [place]. This information is not intended to replace advice given to you by your health care provider. Make sure you discuss any questions you have with your health care provider.

## 2014-06-14 NOTE — Progress Notes (Signed)
Referring Provider: No ref. provider found Primary Care Physician:  No PCP Per Patient Primary GI: Dr. Darrick PennaFields   Chief Complaint  Patient presents with  . Office Visit    HPI:   Heidi [name] presents today in hospital follow-up after admission for jaundice, abdominal pain, elevated LFTs. Hep C antibody positive with +RNA. Autoimmune/PBC serologies normal. Upper abdominal discomfort, sometimes constant, sometimes comes and goes. Associated nausea. Appetite with some improvement. No vomiting. Not eating a whole lot. Subjective fever/chills. Not taken temperature. States has hot flashes. Noticed jaundice improving. No mental status changes. +itching. Oxycodone helped with pain. Hasn't had any in a few days. No ETOH use. No further drug use. Last used 6 months ago IV drug use. Has had Hep A and B immunizations in the past.   Past Medical History  Diagnosis Date  . Chronic shoulder pain   . Anxiety   . Hepatitis C     Past Surgical History  Procedure Laterality Date  . Ankle surgery      Current Outpatient Prescriptions  Medication Sig Dispense Refill  . clonazePAM (KLONOPIN) 1 MG tablet Take 1 mg by mouth 3 (three) times daily as needed. For anxiety      . ondansetron (ZOFRAN) 4 MG tablet Take 1 tablet (4 mg total) by mouth every 6 (six) hours as needed for nausea.  20 tablet  0  . pantoprazole (PROTONIX) 40 MG tablet Take 1 tablet (40 mg total) by mouth daily.  30 tablet  0  . polyethylene glycol (MIRALAX / GLYCOLAX) packet Take 17 g by mouth daily with breakfast.  14 each  0  . oxyCODONE (ROXICODONE) 5 MG immediate release tablet Take 1 tablet (5 mg total) by mouth every 6 (six) hours as needed for severe pain.  20 tablet  0   No current facility-administered medications for this visit.    Allergies as of 06/14/2014 - Review Complete 06/14/2014  Allergen Reaction Noted  . Penicillins Hives and Other (See Comments)   . Tylenol [acetaminophen]  02/22/2014  . Venlafaxine  Hives and Other (See Comments)   . Morphine and related Hives and Other (See Comments) 10/09/2011    Family History  Problem Relation Age of Onset  . Cirrhosis Father   . Hepatitis C Father     History   Social History  . Marital Status: Legally Separated    Spouse Name: [name]    Number of Children: 2  . Years of Education: [name]   Occupational History  . UNEMPLOYED    Social History Main Topics  . Smoking status: Current Every Day Smoker -- 1.00 packs/day    Types: Cigarettes  . Smokeless tobacco: Not on file  . Alcohol Use: Yes  . Drug Use: Yes    Special: Marijuana, Heroin     Comment: today  . Sexual Activity: Yes    Birth Control/ Protection: IUD   Other Topics Concern  . Not on file   Social History Narrative  . No narrative on file    Review of Systems: As mentioned in HPI  Physical Exam: BP 109/75  Pulse 48  Temp(Src) 98 F (36.7 C) (Oral)  Resp 18  Ht 5\' 7"  (1.702 m)  Wt 126 lb (57.153 kg)  BMI 19.73 kg/m2 General:   Alert and oriented. No distress noted. Pleasant and cooperative. Very mild jaundice, much improved from hospital discharge.  Eyes:  Conjuctiva clear with mild scleral icterus, improved from discharge.  Mouth:  Oral mucosa pink and moist. Good dentition. No lesions. Abdomen:  +BS, soft, mild TTP epigastric and RUQand non-distended. No rebound or guarding. No HSM or masses noted. Msk:  Symmetrical without gross deformities. Normal posture. Extremities:  Without edema. Neurologic:  Alert and  oriented x4;  grossly normal neurologically. Skin:  Intact without significant lesions or rashes. Psych:  Alert and cooperative. Normal mood and affect.

## 2014-06-15 NOTE — Progress Notes (Signed)
NO PCP

## 2014-06-15 NOTE — Progress Notes (Signed)
Quick Note:  LFTs continue to drastically improve as expected.  Keep appt upcoming for 3 months.  ______

## 2014-06-15 NOTE — Progress Notes (Signed)
Quick Note:  Called and phone not working. Mailed a letter with the info. ______

## 2014-06-22 ENCOUNTER — Ambulatory Visit: Payer: 59 | Admitting: Gastroenterology

## 2014-07-03 ENCOUNTER — Emergency Department (HOSPITAL_COMMUNITY)
Admission: EM | Admit: 2014-07-03 | Discharge: 2014-07-03 | Disposition: A | Discharge status: Home / Self Care | Payer: Medicaid Other | Attending: Emergency Medicine | Admitting: Emergency Medicine

## 2014-07-03 ENCOUNTER — Encounter (HOSPITAL_COMMUNITY): Payer: Self-pay | Admitting: Emergency Medicine

## 2014-07-03 ENCOUNTER — Emergency Department (HOSPITAL_COMMUNITY): Payer: Medicaid Other

## 2014-07-03 DIAGNOSIS — F411 Generalized anxiety disorder: Secondary | ICD-10-CM | POA: Insufficient documentation

## 2014-07-03 DIAGNOSIS — N390 Urinary tract infection, site not specified: Secondary | ICD-10-CM

## 2014-07-03 DIAGNOSIS — F1210 Cannabis abuse, uncomplicated: Secondary | ICD-10-CM | POA: Diagnosis not present

## 2014-07-03 DIAGNOSIS — F1410 Cocaine abuse, uncomplicated: Secondary | ICD-10-CM | POA: Diagnosis not present

## 2014-07-03 DIAGNOSIS — K759 Inflammatory liver disease, unspecified: Secondary | ICD-10-CM | POA: Insufficient documentation

## 2014-07-03 DIAGNOSIS — Z79899 Other long term (current) drug therapy: Secondary | ICD-10-CM | POA: Insufficient documentation

## 2014-07-03 DIAGNOSIS — F1310 Sedative, hypnotic or anxiolytic abuse, uncomplicated: Secondary | ICD-10-CM | POA: Diagnosis not present

## 2014-07-03 DIAGNOSIS — G8929 Other chronic pain: Secondary | ICD-10-CM | POA: Insufficient documentation

## 2014-07-03 DIAGNOSIS — F17200 Nicotine dependence, unspecified, uncomplicated: Secondary | ICD-10-CM | POA: Insufficient documentation

## 2014-07-03 DIAGNOSIS — R109 Unspecified abdominal pain: Secondary | ICD-10-CM | POA: Diagnosis present

## 2014-07-03 DIAGNOSIS — Z791 Long term (current) use of non-steroidal anti-inflammatories (NSAID): Secondary | ICD-10-CM | POA: Insufficient documentation

## 2014-07-03 DIAGNOSIS — Z880 Allergy status to penicillin: Secondary | ICD-10-CM | POA: Insufficient documentation

## 2014-07-03 DIAGNOSIS — F1910 Other psychoactive substance abuse, uncomplicated: Secondary | ICD-10-CM

## 2014-07-03 LAB — RAPID URINE DRUG SCREEN, HOSP PERFORMED
AMPHETAMINES: NOT DETECTED
BARBITURATES: NOT DETECTED
Benzodiazepines: POSITIVE — AB
Cocaine: POSITIVE — AB
Opiates: NOT DETECTED
Tetrahydrocannabinol: POSITIVE — AB

## 2014-07-03 LAB — URINALYSIS, ROUTINE W REFLEX MICROSCOPIC
Glucose, UA: 100 mg/dL — AB
Ketones, ur: NEGATIVE mg/dL
Nitrite: NEGATIVE
PROTEIN: 30 mg/dL — AB
Specific Gravity, Urine: >1.03 — ABNORMAL HIGH (ref 1.005–1.030)
Urobilinogen, UA: 2 mg/dL — ABNORMAL HIGH (ref 0.0–1.0)
pH: 5.5 (ref 5.0–8.0)

## 2014-07-03 LAB — CBC WITH DIFFERENTIAL/PLATELET
Basophils Absolute: 0 10*3/uL (ref 0.0–0.1)
Basophils Relative: 1 % (ref 0–1)
EOS ABS: 0.1 10*3/uL (ref 0.0–0.7)
Eosinophils Relative: 2 % (ref 0–5)
HEMATOCRIT: 37.9 % (ref 36.0–46.0)
Hemoglobin: 12.7 g/dL (ref 12.0–15.0)
LYMPHS ABS: 1.7 10*3/uL (ref 0.7–4.0)
LYMPHS PCT: 26 % (ref 12–46)
MCH: 27 pg (ref 26.0–34.0)
MCHC: 33.5 g/dL (ref 30.0–36.0)
MCV: 80.6 fL (ref 78.0–100.0)
MONO ABS: 0.4 10*3/uL (ref 0.1–1.0)
MONOS PCT: 7 % (ref 3–12)
Neutro Abs: 4 10*3/uL (ref 1.7–7.7)
Neutrophils Relative %: 64 % (ref 43–77)
PLATELETS: 248 10*3/uL (ref 150–400)
RBC: 4.7 MIL/uL (ref 3.87–5.11)
RDW: 14.5 % (ref 11.5–15.5)
WBC: 6.3 10*3/uL (ref 4.0–10.5)

## 2014-07-03 LAB — URINE MICROSCOPIC-ADD ON

## 2014-07-03 LAB — COMPREHENSIVE METABOLIC PANEL
ALK PHOS: 81 U/L (ref 39–117)
ALT: 96 U/L — AB (ref 0–35)
AST: 63 U/L — AB (ref 0–37)
Albumin: 3.7 g/dL (ref 3.5–5.2)
Anion gap: 13 (ref 5–15)
BUN: 10 mg/dL (ref 6–23)
CO2: 23 meq/L (ref 19–32)
Calcium: 9 mg/dL (ref 8.4–10.5)
Chloride: 100 mEq/L (ref 96–112)
Creatinine, Ser: 0.58 mg/dL (ref 0.50–1.10)
GFR calc non Af Amer: >90 mL/min (ref >90)
Glucose, Bld: 115 mg/dL — ABNORMAL HIGH (ref 70–99)
Potassium: 3.2 mEq/L — ABNORMAL LOW (ref 3.7–5.3)
SODIUM: 136 meq/L — AB (ref 137–147)
TOTAL PROTEIN: 7.5 g/dL (ref 6.0–8.3)
Total Bilirubin: 1.2 mg/dL (ref 0.3–1.2)

## 2014-07-03 LAB — LIPASE, BLOOD: Lipase: 28 U/L (ref 11–59)

## 2014-07-03 LAB — PREGNANCY, URINE: PREG TEST UR: NEGATIVE

## 2014-07-03 MED ORDER — NAPROXEN 500 MG PO TABS: 500.0000 mg | ORAL_TABLET | Freq: Two times a day (BID) | ORAL | Status: DC

## 2014-07-03 MED ORDER — CIPROFLOXACIN HCL 500 MG PO TABS: 500.0000 mg | ORAL_TABLET | Freq: Two times a day (BID) | ORAL | Status: DC

## 2014-07-03 MED ORDER — FENTANYL CITRATE 0.05 MG/ML IJ SOLN
25.0000 ug | Freq: Once | INTRAMUSCULAR | Status: AC
  Administered 2014-07-03: 25 ug via INTRAVENOUS
  Filled 2014-07-03: qty 2

## 2014-07-03 MED ORDER — IOHEXOL 300 MG/ML  SOLN
100.0000 mL | Freq: Once | INTRAMUSCULAR | Status: AC | PRN
  Administered 2014-07-03: 100 mL via INTRAVENOUS

## 2014-07-03 MED ORDER — PROMETHAZINE HCL 25 MG PO TABS: 25.0000 mg | ORAL_TABLET | Freq: Four times a day (QID) | ORAL | Status: DC | PRN

## 2014-07-03 MED ORDER — IOHEXOL 300 MG/ML  SOLN
50.0000 mL | Freq: Once | INTRAMUSCULAR | Status: AC | PRN
  Administered 2014-07-03: 50 mL via ORAL

## 2014-07-03 MED ORDER — ONDANSETRON HCL 4 MG/2ML IJ SOLN
4.0000 mg | Freq: Once | INTRAMUSCULAR | Status: AC
  Administered 2014-07-03: 4 mg via INTRAVENOUS
  Filled 2014-07-03: qty 2

## 2014-07-03 MED ORDER — CIPROFLOXACIN IN D5W 400 MG/200ML IV SOLN
400.0000 mg | Freq: Once | INTRAVENOUS | Status: AC
  Administered 2014-07-03: 400 mg via INTRAVENOUS
  Filled 2014-07-03: qty 200

## 2014-07-03 MED ORDER — SODIUM CHLORIDE 0.9 % IV BOLUS (SEPSIS)
250.0000 mL | Freq: Once | INTRAVENOUS | Status: AC
  Administered 2014-07-03: 20:00:00 via INTRAVENOUS

## 2014-07-03 MED ORDER — SODIUM CHLORIDE 0.9 % IV SOLN: INTRAVENOUS | Status: DC

## 2014-07-03 NOTE — ED Notes (Signed)
Complain of abdominal pain with vomiting

## 2014-07-03 NOTE — Discharge Instructions (Signed)
Take the antibiotic as directed for the urinary tract infection. Take Phenergan as needed for nausea and vomiting. The Naprosyn as needed for pain. Return for any newer worse symptoms. The urinary tract infection symptoms should be improving in 2 days.

## 2014-07-03 NOTE — ED Provider Notes (Addendum)
CSN: [id number]     Arrival date & time 07/03/14  1758 History   First MD Initiated Contact with Patient 07/03/14 1913     Chief Complaint  Patient presents with  . Abdominal Pain     (Consider location/radiation/quality/duration/timing/severity/associated sxs/prior Treatment) Patient is a 28 y.o. female presenting with abdominal pain. The history is provided by the patient.  Abdominal Pain Associated symptoms: nausea and vomiting   Associated symptoms: no chest pain, no diarrhea, no dysuria, no fever, no hematuria, no shortness of breath and no vaginal discharge    patient with onset of right upper quadrant abdominal pain yesterday worse today. 6/10. Today developed back pain both sides. No dysuria no fever patient has history of hepatitis C. Patient's had some loose bowel movements but no true diarrhea no blood in the bowel movements. Has had some vomiting no blood in the vomit.   Past Medical History  Diagnosis Date  . Chronic shoulder pain   . Anxiety   . Hepatitis C    Past Surgical History  Procedure Laterality Date  . Ankle surgery     Family History  Problem Relation Age of Onset  . Cirrhosis Father   . Hepatitis C Father    History  Substance Use Topics  . Smoking status: Current Every Day Smoker -- 1.00 packs/day    Types: Cigarettes  . Smokeless tobacco: Not on file  . Alcohol Use: Yes   OB History   Grav Para Term Preterm Abortions TAB SAB Ect Mult Living                 Review of Systems  Constitutional: Negative for fever.  HENT: Negative for congestion.   Eyes: Negative for visual disturbance.  Respiratory: Negative for shortness of breath.   Cardiovascular: Negative for chest pain.  Gastrointestinal: Positive for nausea, vomiting and abdominal pain. Negative for diarrhea.  Genitourinary: Negative for dysuria, hematuria and vaginal discharge.  Musculoskeletal: Positive for back pain.  Skin: Negative for rash.  Neurological: Negative for headaches.   Hematological: Does not bruise/bleed easily.  Psychiatric/Behavioral: Negative for confusion.      Allergies  Penicillins; Tylenol; Venlafaxine; and Morphine and related  Home Medications   Prior to Admission medications   Medication Sig Start Date End Date Taking? Authorizing Provider  clonazePAM (KLONOPIN) 1 MG tablet Take 1 mg by mouth 3 (three) times daily as needed. For anxiety   Yes Historical Provider, MD  pantoprazole (PROTONIX) 40 MG tablet Take 1 tablet (40 mg total) by mouth daily. 06/07/14  Yes Maryann Mikhail, DO  polyethylene glycol (MIRALAX / GLYCOLAX) packet Take 17 g by mouth daily with breakfast. 06/07/14  Yes Maryann Mikhail, DO  ciprofloxacin (CIPRO) 500 MG tablet Take 1 tablet (500 mg total) by mouth 2 (two) times daily. 07/03/14   Vanetta MuldersScott Marek Nghiem, MD  naproxen (NAPROSYN) 500 MG tablet Take 1 tablet (500 mg total) by mouth 2 (two) times daily. 07/03/14   Vanetta MuldersScott Buddy Loeffelholz, MD  promethazine (PHENERGAN) 25 MG tablet Take 1 tablet (25 mg total) by mouth every 6 (six) hours as needed. 07/03/14   Vanetta MuldersScott Nesa Distel, MD   BP 142/84  Pulse 121  Temp(Src) 98 F (36.7 C) (Oral)  Resp 24  Ht 5\' 7"  (1.702 m)  Wt 125 lb (56.7 kg)  BMI 19.57 kg/m2  SpO2 100%  LMP 03/16/2014 Physical Exam  Nursing note and vitals reviewed. Constitutional: She is oriented to person, place, and time. She appears well-developed and well-nourished. No distress.  HENT:  Head: Normocephalic and atraumatic.  Mouth/Throat: Oropharynx is clear and moist.  Eyes: Conjunctivae and EOM are normal. Pupils are equal, round, and reactive to light.  Neck: Normal range of motion.  Cardiovascular: Regular rhythm and normal heart sounds.   No murmur heard. Tachycardic  Pulmonary/Chest: Effort normal and breath sounds normal. No respiratory distress.  Abdominal: Soft. Bowel sounds are normal. There is no tenderness.  Abdomen nontender to palpation. No CVA tenderness.  Musculoskeletal: Normal range of motion.  She exhibits no edema.  Neurological: She is alert and oriented to person, place, and time. No cranial nerve deficit. She exhibits normal muscle tone. Coordination normal.  Skin: Skin is warm. No rash noted.    ED Course  Procedures (including critical care time) Labs Review Labs Reviewed  URINALYSIS, ROUTINE W REFLEX MICROSCOPIC - Abnormal; Notable for the following:    Color, Urine AMBER (*)    APPearance CLOUDY (*)    Specific Gravity, Urine >1.030 (*)    Glucose, UA 100 (*)    Hgb urine dipstick SMALL (*)    Bilirubin Urine MODERATE (*)    Protein, ur 30 (*)    Urobilinogen, UA 2.0 (*)    Leukocytes, UA TRACE (*)    All other components within normal limits  URINE RAPID DRUG SCREEN (HOSP PERFORMED) - Abnormal; Notable for the following:    Cocaine POSITIVE (*)    Benzodiazepines POSITIVE (*)    Tetrahydrocannabinol POSITIVE (*)    All other components within normal limits  COMPREHENSIVE METABOLIC PANEL - Abnormal; Notable for the following:    Sodium 136 (*)    Potassium 3.2 (*)    Glucose, Bld 115 (*)    AST 63 (*)    ALT 96 (*)    All other components within normal limits  URINE MICROSCOPIC-ADD ON - Abnormal; Notable for the following:    Squamous Epithelial / LPF FEW (*)    Bacteria, UA FEW (*)    All other components within normal limits  URINE CULTURE  PREGNANCY, URINE  LIPASE, BLOOD  CBC WITH DIFFERENTIAL   Results for orders placed during the hospital encounter of 07/03/14  URINALYSIS, ROUTINE W REFLEX MICROSCOPIC      Result Value Ref Range   Color, Urine AMBER (*) YELLOW   APPearance CLOUDY (*) CLEAR   Specific Gravity, Urine >1.030 (*) 1.005 - 1.030   pH 5.5  5.0 - 8.0   Glucose, UA 100 (*) NEGATIVE mg/dL   Hgb urine dipstick SMALL (*) NEGATIVE   Bilirubin Urine MODERATE (*) NEGATIVE   Ketones, ur NEGATIVE  NEGATIVE mg/dL   Protein, ur 30 (*) NEGATIVE mg/dL   Urobilinogen, UA 2.0 (*) 0.0 - 1.0 mg/dL   Nitrite NEGATIVE  NEGATIVE   Leukocytes, UA  TRACE (*) NEGATIVE  PREGNANCY, URINE      Result Value Ref Range   Preg Test, Ur NEGATIVE  NEGATIVE  URINE RAPID DRUG SCREEN (HOSP PERFORMED)      Result Value Ref Range   Opiates NONE DETECTED  NONE DETECTED   Cocaine POSITIVE (*) NONE DETECTED   Benzodiazepines POSITIVE (*) NONE DETECTED   Amphetamines NONE DETECTED  NONE DETECTED   Tetrahydrocannabinol POSITIVE (*) NONE DETECTED   Barbiturates NONE DETECTED  NONE DETECTED  COMPREHENSIVE METABOLIC PANEL      Result Value Ref Range   Sodium 136 (*) 137 - 147 mEq/L   Potassium 3.2 (*) 3.7 - 5.3 mEq/L   Chloride 100  96 - 112 mEq/L  CO2 23  19 - 32 mEq/L   Glucose, Bld 115 (*) 70 - 99 mg/dL   BUN 10  6 - 23 mg/dL   Creatinine, Ser 4.09  0.50 - 1.10 mg/dL   Calcium 9.0  8.4 - 81.1 mg/dL   Total Protein 7.5  6.0 - 8.3 g/dL   Albumin 3.7  3.5 - 5.2 g/dL   AST 63 (*) 0 - 37 U/L   ALT 96 (*) 0 - 35 U/L   Alkaline Phosphatase 81  39 - 117 U/L   Total Bilirubin 1.2  0.3 - 1.2 mg/dL   GFR calc non Af Amer >90  >90 mL/min   GFR calc Af Amer >90  >90 mL/min   Anion gap 13  5 - 15  LIPASE, BLOOD      Result Value Ref Range   Lipase 28  11 - 59 U/L  CBC WITH DIFFERENTIAL      Result Value Ref Range   WBC 6.3  4.0 - 10.5 K/uL   RBC 4.70  3.87 - 5.11 MIL/uL   Hemoglobin 12.7  12.0 - 15.0 g/dL   HCT 91.4  78.2 - 95.6 %   MCV 80.6  78.0 - 100.0 fL   MCH 27.0  26.0 - 34.0 pg   MCHC 33.5  30.0 - 36.0 g/dL   RDW 21.3  08.6 - 57.8 %   Platelets 248  150 - 400 K/uL   Neutrophils Relative % 64  43 - 77 %   Neutro Abs 4.0  1.7 - 7.7 K/uL   Lymphocytes Relative 26  12 - 46 %   Lymphs Abs 1.7  0.7 - 4.0 K/uL   Monocytes Relative 7  3 - 12 %   Monocytes Absolute 0.4  0.1 - 1.0 K/uL   Eosinophils Relative 2  0 - 5 %   Eosinophils Absolute 0.1  0.0 - 0.7 K/uL   Basophils Relative 1  0 - 1 %   Basophils Absolute 0.0  0.0 - 0.1 K/uL  URINE MICROSCOPIC-ADD ON      Result Value Ref Range   Squamous Epithelial / LPF FEW (*) RARE   WBC, UA  11-20  <3 WBC/hpf   RBC / HPF 0-2  <3 RBC/hpf   Bacteria, UA FEW (*) RARE     Imaging Review Ct Abdomen Pelvis W Contrast  07/03/2014   CLINICAL DATA:  Mid to low back pain today.  History of hepatitis-C.  EXAM: CT ABDOMEN AND PELVIS WITH CONTRAST  TECHNIQUE: Multidetector CT imaging of the abdomen and pelvis was performed using the standard protocol following bolus administration of intravenous contrast.  CONTRAST:  50mL OMNIPAQUE IOHEXOL 300 MG/ML SOLN, OMNIPAQUE IOHEXOL 300 MG/ML SOLN  COMPARISON:  Abdominal pelvic CT 06/04/2014.  FINDINGS: Lung bases: Stable linear scarring or atelectasis in the right lower lobe. No pleural or pericardial effusion.  Liver: Mild periportal edema. 5 mm low-density lesion inferiorly in the right hepatic lobe on image 33 is not as well seen on the axial images from the prior study, although appears stable based on the coronal images. There is another tiny lesion in the right lobe on image 15, also stable.  Gallbladder/Biliary system: The gallbladder is incompletely distended, although demonstrates no wall thickening or surrounding inflammatory change. No biliary dilatation.  Pancreas: Unremarkable.  Spleen: Unremarkable.  Adrenal glands: No evidence of adrenal mass.  Kidneys/Ureters/Bladder: No evidence of renal mass, hydronephrosis or urinary tract calculus. The bladder appears normal.  Bowel: The  stomach, small bowel, appendix and colon appear normal.  Peritoneum: No ascites or peritoneal nodularity.  Lymph Nodes: Small lymph nodes within the porta hepatis are stable, not pathologically enlarged.  Vascular Structures: No significant vascular findings.  Reproductive Organs: Intrauterine device is noted. There are small ovarian follicles bilaterally. No evidence of adnexal mass. Asymmetric venous collaterals of the left ovarian vein are noted.  Abdominal wall:  No evidence of abdominal wall hernia or mass.  Musculoskeletal: Unremarkable.  IMPRESSION: 1. No acute  findings demonstrated. The lumbar spine and sacroiliac joints appear normal. 2. Mild periportal edema in the liver may be related to history of hepatitis. Small liver lesions are unchanged. 3. Small left ovarian vein collaterals.   Electronically Signed   By: Roxy Horseman M.D.   On: 07/03/2014 22:04     EKG Interpretation None      MDM   Final diagnoses:  UTI (lower urinary tract infection)  Polysubstance abuse  Hepatitis    Patient with history of hepatitis C. Liver function tests are consistent with that but no acute changes. No significant leukocytosis. Urinalysis does seem to be consistent with a urinary tract infection. Patient denies any vaginal discharge. Patient's abdominal pain is not in the lower cord returns. Since a right upper quadrant over her liver. Pain does radiate to the back area. Patient said nausea and vomiting but no blood in the vomit. CT scan of the abdomen is pending. Disposition will be based on that. Based urinary tract infection patient can be treated as an outpatient with Cipro. Patient given fentanyl here no significant improvement in the pain however patient looks a lot more comparable. Patient's urine drug screen shows that she still a polysubstance abuser. Pregnancy test was negative. Lipase not elevated not consistent with pancreatitis. No significant anemia. Patient has a past history of narcotic abuse. Symptoms could be related to the cardiac withdrawal. The patients not toxic.  CT scan without any significant findings. Patient will be redosed with the IV pain medicine here and then discharged home with treatment for the urinary tract infection with Cipro Phenergan for the nausea and vomiting and Naprosyn for the pain. Patient has a history of substance abuse including narcotics.   Vanetta Mulders, MD 07/03/14 2117  Vanetta Mulders, MD 07/03/14 [phone]

## 2014-07-05 LAB — URINE CULTURE
COLONY COUNT: NO GROWTH
CULTURE: NO GROWTH

## 2014-07-06 ENCOUNTER — Telehealth: Payer: Self-pay | Admitting: Gastroenterology

## 2014-07-06 ENCOUNTER — Encounter: Payer: 59 | Admitting: Gastroenterology

## 2014-07-06 NOTE — Progress Notes (Signed)
   Subjective:    Patient ID: Heidi RoseJodi Lopresti, female    DOB: 04/26/1986, 28 y.o.   MRN: [id number]  HPI  Past Medical History  Diagnosis Date  . Chronic shoulder pain   . Anxiety   . Hepatitis C      Review of Systems     Objective:   Physical Exam        Assessment & Plan:

## 2014-07-06 NOTE — Telephone Encounter (Signed)
Pt was a no show

## 2014-07-06 NOTE — Telephone Encounter (Signed)
REVIEWED.  

## 2014-07-06 NOTE — Progress Notes (Signed)
REVIEWED.  

## 2014-08-18 ENCOUNTER — Encounter: Payer: Self-pay | Admitting: Gastroenterology

## 2014-09-14 ENCOUNTER — Emergency Department (HOSPITAL_COMMUNITY)
Admission: EM | Admit: 2014-09-14 | Discharge: 2014-09-14 | Disposition: A | Discharge status: Home / Self Care | Payer: MEDICAID | Attending: Emergency Medicine | Admitting: Emergency Medicine

## 2014-09-14 ENCOUNTER — Encounter (HOSPITAL_COMMUNITY): Payer: Self-pay | Admitting: Emergency Medicine

## 2014-09-14 DIAGNOSIS — Z008 Encounter for other general examination: Secondary | ICD-10-CM | POA: Diagnosis present

## 2014-09-14 DIAGNOSIS — Z880 Allergy status to penicillin: Secondary | ICD-10-CM | POA: Diagnosis not present

## 2014-09-14 DIAGNOSIS — F1910 Other psychoactive substance abuse, uncomplicated: Secondary | ICD-10-CM

## 2014-09-14 DIAGNOSIS — Z8619 Personal history of other infectious and parasitic diseases: Secondary | ICD-10-CM | POA: Diagnosis not present

## 2014-09-14 DIAGNOSIS — Z792 Long term (current) use of antibiotics: Secondary | ICD-10-CM | POA: Diagnosis not present

## 2014-09-14 DIAGNOSIS — F1110 Opioid abuse, uncomplicated: Secondary | ICD-10-CM | POA: Diagnosis not present

## 2014-09-14 DIAGNOSIS — Z79899 Other long term (current) drug therapy: Secondary | ICD-10-CM | POA: Insufficient documentation

## 2014-09-14 DIAGNOSIS — G8929 Other chronic pain: Secondary | ICD-10-CM | POA: Diagnosis not present

## 2014-09-14 DIAGNOSIS — F411 Generalized anxiety disorder: Secondary | ICD-10-CM | POA: Insufficient documentation

## 2014-09-14 DIAGNOSIS — Z791 Long term (current) use of non-steroidal anti-inflammatories (NSAID): Secondary | ICD-10-CM | POA: Insufficient documentation

## 2014-09-14 LAB — URINALYSIS, ROUTINE W REFLEX MICROSCOPIC
Bilirubin Urine: NEGATIVE
GLUCOSE, UA: NEGATIVE mg/dL
HGB URINE DIPSTICK: NEGATIVE
KETONES UR: NEGATIVE mg/dL
Nitrite: NEGATIVE
PROTEIN: NEGATIVE mg/dL
Specific Gravity, Urine: 1.015 (ref 1.005–1.030)
Urobilinogen, UA: 0.2 mg/dL (ref 0.0–1.0)
pH: 6 (ref 5.0–8.0)

## 2014-09-14 LAB — RAPID URINE DRUG SCREEN, HOSP PERFORMED
AMPHETAMINES: NOT DETECTED
BENZODIAZEPINES: NOT DETECTED
Barbiturates: NOT DETECTED
Cocaine: NOT DETECTED
OPIATES: NOT DETECTED
Tetrahydrocannabinol: NOT DETECTED

## 2014-09-14 LAB — URINE MICROSCOPIC-ADD ON

## 2014-09-14 MED ORDER — CLONIDINE HCL 0.2 MG PO TABS: 0.2000 mg | ORAL_TABLET | Freq: Two times a day (BID) | ORAL | Status: DC

## 2014-09-14 NOTE — ED Provider Notes (Signed)
CSN: [id number]     Arrival date & time 09/14/14  1017 History   This chart was scribed for Gerhard Munchobert Harrison Paulson, MD by Jolene Provostobert Halas, ED Scribe. This patient was seen in room APA15/APA15 and the patient's care was started at 11:39 AM.    Chief Complaint  Patient presents with  . Medical Clearance   The history is provided by the patient. No language interpreter was used.   HPI Comments: Heidi [name] is a 28 y.o. female who presents to the Emergency Department with a request for assistance with counseling for opiate addiction. Pt states that she has been using pain pills intermittently for the last five years. Pt states that she is not pregnant. Pt states that this is the third day of withdrawal. Pt endorses fever, nausea, vomiting, Loss of appetite but states that those symptoms have mostly passed now that she is a few days into her absence from narcotics. Pt denies SI/HI. Pt also denies seizures or depression. Pt is compliant with her anxiety medication Klonopin, which was prescribed by her OBGYN. Pt had been admitted to ED previously for detox for the same symptoms.   Past Medical History  Diagnosis Date  . Chronic shoulder pain   . Anxiety   . Hepatitis C    Past Surgical History  Procedure Laterality Date  . Ankle surgery     Family History  Problem Relation Age of Onset  . Cirrhosis Father   . Hepatitis C Father    History  Substance Use Topics  . Smoking status: Current Every Day Smoker -- 1.00 packs/day    Types: Cigarettes  . Smokeless tobacco: Not on file  . Alcohol Use: No   OB History   Grav Para Term Preterm Abortions TAB SAB Ect Mult Living                 Review of Systems  Constitutional:       Per HPI, otherwise negative  HENT:       Per HPI, otherwise negative  Respiratory:       Per HPI, otherwise negative  Cardiovascular:       Per HPI, otherwise negative  Gastrointestinal: Negative for vomiting.  Endocrine:       Negative aside from HPI   Genitourinary:       Neg aside from HPI   Musculoskeletal:       Per HPI, otherwise negative  Skin: Negative.   Neurological: Negative for syncope.      Allergies  Penicillins; Tylenol; Venlafaxine; and Morphine and related  Home Medications   Prior to Admission medications   Medication Sig Start Date End Date Taking? Authorizing Provider  ciprofloxacin (CIPRO) 500 MG tablet Take 1 tablet (500 mg total) by mouth 2 (two) times daily. 07/03/14   Vanetta MuldersScott Zackowski, MD  clonazePAM (KLONOPIN) 1 MG tablet Take 1 mg by mouth 3 (three) times daily as needed. For anxiety    Historical Provider, MD  naproxen (NAPROSYN) 500 MG tablet Take 1 tablet (500 mg total) by mouth 2 (two) times daily. 07/03/14   Vanetta MuldersScott Zackowski, MD  pantoprazole (PROTONIX) 40 MG tablet Take 1 tablet (40 mg total) by mouth daily. 06/07/14   Maryann Mikhail, DO  polyethylene glycol (MIRALAX / GLYCOLAX) packet Take 17 g by mouth daily with breakfast. 06/07/14   Maryann Mikhail, DO  promethazine (PHENERGAN) 25 MG tablet Take 1 tablet (25 mg total) by mouth every 6 (six) hours as needed. 07/03/14   Vanetta MuldersScott Zackowski, MD  BP 121/73  Pulse 106  Temp(Src) 98.5 F (36.9 C)  Resp 18  Ht 5\' 7"  (1.702 m)  Wt 128 lb (58.06 kg)  BMI 20.04 kg/m2  SpO2 100% Physical Exam  Nursing note and vitals reviewed. Constitutional: She is oriented to person, place, and time. She appears well-developed and well-nourished. No distress.  HENT:  Head: Normocephalic and atraumatic.  Eyes: Conjunctivae and EOM are normal.  Cardiovascular: Normal rate, regular rhythm and normal heart sounds.   Pulmonary/Chest: Effort normal and breath sounds normal. No stridor. No respiratory distress.  Abdominal: She exhibits no distension.  Musculoskeletal: She exhibits no edema.  Neurological: She is alert and oriented to person, place, and time. No cranial nerve deficit.  Skin: Skin is warm and dry.  Psychiatric: Her mood appears anxious.    ED Course   Procedures (including critical care time)  DIAGNOSTIC STUDIES: Oxygen Saturation is 100% on room air, adequate by my interpretation.    COORDINATION OF CARE: 11:44 AM Will order labs. Pt agrees to treatment plan.  Labs Review Labs Reviewed  URINALYSIS, ROUTINE W REFLEX MICROSCOPIC - Abnormal; Notable for the following:    Leukocytes, UA MODERATE (*)    All other components within normal limits  URINE MICROSCOPIC-ADD ON - Abnormal; Notable for the following:    Squamous Epithelial / LPF FEW (*)    Bacteria, UA FEW (*)    All other components within normal limits  URINE RAPID DRUG SCREEN (HOSP PERFORMED)    MDM    I personally performed the services described in this documentation, which was scribed in my presence. The recorded information has been reviewed and is accurate.   Patient presents with request for assistance with cessation from opiate abuse. Patient is in no distress, is awake, alert, hemodynamically stable, with no evidence for acute withdrawal. Patient was provided outpatient referrals, started on clonidine (short course for assistance with her drug withdrawal).     Gerhard Munch, MD 09/14/14 1248

## 2014-09-14 NOTE — Discharge Instructions (Signed)
As discussed, it is important that you use be provided resources to obtain assistance with your substance abuse.  Return here for concerning changes in your condition.    Emergency Department Resource Guide 1) Find a Doctor and Pay Out of Pocket  Behavioral Health Resources in the Community: Intensive Outpatient Programs Organization         Address  Phone  Notes  [facility] [address], [place], [place] [phone]   [facility] [address] [place], [place] [phone]   [facility] [address], [place], [place]  [phone]   [facility] [address]  [place], [place] [phone] or [phone]   Substance Abuse Resources Organization         Address  Phone  Notes  [facility]  [phone]   [facility]  [phone]   [facility]  [phone]   Floydene Flock  [phone]   Residential & Outpatient Substance Abuse Program  [phone]   Psychological Services Organization         Address  Phone  Notes  [facility]  [phone]   [facility]  [phone]   [facility] [address], [place] [phone] or [phone]    Mobile Crisis Teams Organization         Address  Phone  Notes  [facility], Mobile Crisis Care Unit  [phone]   [facility]  [address] [place], [place] [phone]   Doristine Locks [address] [place] [phone]    Self-Help/Support Groups Organization         Address  Phone             Notes  [facility] [place] - variety of support groups  [phone]- [phone] Call for more information  Narcotics Anonymous (NA), [facility] [address], [employer] [place]  2 meetings at this location   Statistician         Address  Phone  Notes  [facility]  [address],    [place]  [phone]   [facility]  [address], [place] [address], [place], [place] [phone]   [facility] [address] [place], [place] [phone] Admissions: 8am-3pm M-F  [facility] [address],    [place], [place] [phone]   [facility] [address] [place], [place], [place] [phone]   [facility] [address],  [place], [place] [phone]   [facility] - Intensive Outpatient [address], [place], [place] [phone]   [facility] ([facility] [address] [place]  [place], [place] [phone] or [phone]   [facility] ([facility] [address], [place], [place] [phone] Accepts Medicaid  [facility] [address]  [place] [phone] Substance Abuse/Addiction Treatment   [facility] Organization         Address  Phone  Notes  [facility]  [phone]   Angie Fava, PhD [address] [place], [place]   [phone] or [phone]   [facility]   [address] [place], [place] [phone]   [facility] [address], [place], [place] [phone] Insurance/Medicaid/sponsorship through [employer] and Families [address]  [place], [place] [phone](336) [phone] Therapy/tele-psych/case  [facility] [address]   [place], [place] [phone](336) [phone]    Dr. Lolly MustacheArfeen  [phone](336) [phone]   [facility]  [employer] [facility] 1) [address], [place] 2) [address], [place] 3)  [address], [place] [phone]) [phone]) [phone]  [phone](336) [phone]   [facility] Child Abuse Hotline [phone](336) 6186673121 or [phone](336) [phone] (After Hours)

## 2014-09-14 NOTE — ED Notes (Signed)
Pt here for help getting into inpatient rehab. Last used opiates x 3 days ago.

## 2014-09-14 NOTE — ED Notes (Signed)
Patient states that she is seeking help for drug addiction. States that she needs placement in inpatient rehab. Patient states that she last used 3 days ago. States that she was using opiates (Roxicets). Patient states that she has already had what she considers to be withdrawal symptoms and now just feels generalized weakness.

## 2014-10-31 ENCOUNTER — Emergency Department (HOSPITAL_COMMUNITY)
Admission: EM | Admit: 2014-10-31 | Discharge: 2014-10-31 | Disposition: A | Discharge status: Home / Self Care | Payer: Self-pay | Attending: Emergency Medicine | Admitting: Emergency Medicine

## 2014-10-31 ENCOUNTER — Encounter (HOSPITAL_COMMUNITY): Payer: Self-pay | Admitting: Emergency Medicine

## 2014-10-31 DIAGNOSIS — G8929 Other chronic pain: Secondary | ICD-10-CM | POA: Insufficient documentation

## 2014-10-31 DIAGNOSIS — F419 Anxiety disorder, unspecified: Secondary | ICD-10-CM | POA: Insufficient documentation

## 2014-10-31 DIAGNOSIS — Z8619 Personal history of other infectious and parasitic diseases: Secondary | ICD-10-CM | POA: Insufficient documentation

## 2014-10-31 DIAGNOSIS — Z880 Allergy status to penicillin: Secondary | ICD-10-CM | POA: Insufficient documentation

## 2014-10-31 DIAGNOSIS — Z79899 Other long term (current) drug therapy: Secondary | ICD-10-CM | POA: Insufficient documentation

## 2014-10-31 DIAGNOSIS — L02414 Cutaneous abscess of left upper limb: Secondary | ICD-10-CM | POA: Insufficient documentation

## 2014-10-31 DIAGNOSIS — Z720 Tobacco use: Secondary | ICD-10-CM | POA: Insufficient documentation

## 2014-10-31 LAB — CBC WITH DIFFERENTIAL/PLATELET
Basophils Absolute: 0 10*3/uL (ref 0.0–0.1)
Basophils Relative: 1 % (ref 0–1)
EOS PCT: 1 % (ref 0–5)
Eosinophils Absolute: 0.1 10*3/uL (ref 0.0–0.7)
HCT: 41.2 % (ref 36.0–46.0)
HEMOGLOBIN: 14 g/dL (ref 12.0–15.0)
LYMPHS ABS: 1.9 10*3/uL (ref 0.7–4.0)
Lymphocytes Relative: 28 % (ref 12–46)
MCH: 26.9 pg (ref 26.0–34.0)
MCHC: 34 g/dL (ref 30.0–36.0)
MCV: 79.2 fL (ref 78.0–100.0)
MONO ABS: 0.6 10*3/uL (ref 0.1–1.0)
MONOS PCT: 9 % (ref 3–12)
Neutro Abs: 4 10*3/uL (ref 1.7–7.7)
Neutrophils Relative %: 61 % (ref 43–77)
Platelets: 273 10*3/uL (ref 150–400)
RBC: 5.2 MIL/uL — ABNORMAL HIGH (ref 3.87–5.11)
RDW: 13.4 % (ref 11.5–15.5)
WBC: 6.6 10*3/uL (ref 4.0–10.5)

## 2014-10-31 MED ORDER — SULFAMETHOXAZOLE-TRIMETHOPRIM 800-160 MG PO TABS
1.0000 | ORAL_TABLET | Freq: Once | ORAL | Status: AC
  Administered 2014-10-31: 1 via ORAL
  Filled 2014-10-31: qty 1

## 2014-10-31 MED ORDER — SULFAMETHOXAZOLE-TRIMETHOPRIM 800-160 MG PO TABS: 1.0000 | ORAL_TABLET | Freq: Two times a day (BID) | ORAL | Status: DC

## 2014-10-31 MED ORDER — OXYCODONE HCL 5 MG PO TABS
5.0000 mg | ORAL_TABLET | Freq: Once | ORAL | Status: AC
  Administered 2014-10-31: 5 mg via ORAL
  Filled 2014-10-31: qty 1

## 2014-10-31 MED ORDER — OXYCODONE HCL 5 MG PO TABS: 5.0000 mg | ORAL_TABLET | Freq: Four times a day (QID) | ORAL | Status: DC | PRN

## 2014-10-31 MED ORDER — LIDOCAINE HCL (PF) 1 % IJ SOLN
5.0000 mL | Freq: Once | INTRAMUSCULAR | Status: AC
  Administered 2014-10-31: 5 mL via INTRADERMAL
  Filled 2014-10-31: qty 5

## 2014-10-31 NOTE — ED Notes (Signed)
Pt has large abscess to left arm x 1 week, states she has gotten a lot of yellow pus out, area is red, pt feels tired and weak.

## 2014-10-31 NOTE — ED Notes (Signed)
Redness, swollen area to lt upper arm present for 1 week. Pt says she "popped it" 2 days ago and pus came out. Cont to have pain.

## 2014-10-31 NOTE — Discharge Instructions (Signed)
Abscess °An abscess (boil or furuncle) is an infected area on or under the skin. This area is filled with yellowish-white fluid (pus) and other material (debris). °HOME CARE  °· Only take medicines as told by your doctor. °· If you were given antibiotic medicine, take it as directed. Finish the medicine even if you start to feel better. °· If gauze is used, follow your doctor's directions for changing the gauze. °· To avoid spreading the infection: °¨ Keep your abscess covered with a bandage. °¨ Wash your hands well. °¨ Do not share personal care items, towels, or whirlpools with others. °¨ Avoid skin contact with others. °· Keep your skin and clothes clean around the abscess. °· Keep all doctor visits as told. °GET HELP RIGHT AWAY IF:  °· You have more pain, puffiness (swelling), or redness in the wound site. °· You have more fluid or blood coming from the wound site. °· You have muscle aches, chills, or you feel sick. °· You have a fever. °MAKE SURE YOU:  °· Understand these instructions. °· Will watch your condition. °· Will get help right away if you are not doing well or get worse. °Document Released: 05/20/2008 Document Revised: 06/02/2012 Document Reviewed: 02/14/2012 °[employer]® Patient Information ©2015 [employer]. This information is not intended to replace advice given to you by your health care provider. Make sure you discuss any questions you have with your health care provider. ° °

## 2014-10-31 NOTE — ED Provider Notes (Signed)
CSN: [id number]     Arrival date & time 10/31/14  1534 History  This chart was scribed for non-physician practitioner, Pauline Ausammy Kriss Perleberg, PA-C,working with Juliet RudeNathan R. Rubin PayorPickering, MD, by Karle PlumberJennifer Tensley, ED Scribe. This patient was seen in room APFT23/APFT23 and the patient's care was started at 6:37 PM.  Chief Complaint  Patient presents with  . Abscess   Patient is a 28 y.o. female presenting with abscess. The history is provided by the patient. No language interpreter was used.  Abscess Associated symptoms: fever, nausea and vomiting     HPI Comments:  Heidi [name] is a 28 y.o. female with PMH of Hepatitis C who presents to the Emergency Department complaining of severe pain from an abscess located on her left bicep that began approximately one week ago. Reports associated nausea, vomiting, subjective fever, chills. She reports applying warm compresses and manipulating a large amount of yellow pus from the area two days ago. She states she took a pain pill but does not specify what it was. She feels that her symptoms are improving.  She denies any alleviating factors. Denies weakness or tingling of the left arm or hand,  or swelling or her arm. Denies current IV drug use, but does report using IV drugs in the past.  Past Medical History  Diagnosis Date  . Chronic shoulder pain   . Anxiety   . Hepatitis C    Past Surgical History  Procedure Laterality Date  . Ankle surgery     Family History  Problem Relation Age of Onset  . Cirrhosis Father   . Hepatitis C Father    History  Substance Use Topics  . Smoking status: Current Every Day Smoker -- 1.00 packs/day    Types: Cigarettes  . Smokeless tobacco: Not on file  . Alcohol Use: No   OB History    No data available     Review of Systems  Constitutional: Positive for fever and chills.  Respiratory: Negative for chest tightness and shortness of breath.   Cardiovascular: Negative for chest pain.  Gastrointestinal: Positive for  nausea and vomiting. Negative for abdominal pain.  Musculoskeletal: Negative for joint swelling and arthralgias.  Skin: Positive for color change.       Abscess   Neurological: Negative for weakness, light-headedness and numbness.  Hematological: Negative for adenopathy.  All other systems reviewed and are negative.   Allergies  Penicillins; Tylenol; Venlafaxine; and Morphine and related  Home Medications   Prior to Admission medications   Medication Sig Start Date End Date Taking? Authorizing Provider  clonazePAM (KLONOPIN) 1 MG tablet Take 1 mg by mouth 3 (three) times daily as needed. For anxiety   Yes Historical Provider, MD  cloNIDine (CATAPRES) 0.2 MG tablet Take 1 tablet (0.2 mg total) by mouth 2 (two) times daily. Patient not taking: Reported on 10/31/2014 09/14/14   Gerhard Munchobert Lockwood, MD  pantoprazole (PROTONIX) 40 MG tablet Take 1 tablet (40 mg total) by mouth daily. Patient not taking: Reported on 10/31/2014 06/07/14   Nita SellsMaryann Mikhail, DO  promethazine (PHENERGAN) 25 MG tablet Take 1 tablet (25 mg total) by mouth every 6 (six) hours as needed. 07/03/14   Vanetta MuldersScott Zackowski, MD   Triage Vitals: BP 118/77 mmHg  Pulse 107  Temp(Src) 98.7 F (37.1 C) (Oral)  Resp 18  Ht 5\' 7"  (1.702 m)  Wt 126 lb (57.153 kg)  BMI 19.73 kg/m2  SpO2 99% Physical Exam  Constitutional: She is oriented to person, place, and time. She appears well-developed  and well-nourished. No distress.  HENT:  Head: Normocephalic and atraumatic.  Neck: Normal range of motion. Neck supple.  Cardiovascular: Normal rate, regular rhythm, normal heart sounds and intact distal pulses.  Exam reveals no gallop and no friction rub.   No murmur heard. Radial pulses intact.  Pulmonary/Chest: Effort normal and breath sounds normal. No respiratory distress. She has no wheezes. She has no rales.  Musculoskeletal: Normal range of motion.  No tenderness of bicep tendon.  Pt has full ROM of the left UE.  Radial pulse and distal  sensation intact  Lymphadenopathy:    She has no cervical adenopathy.  Neurological: She is alert and oriented to person, place, and time. She exhibits normal muscle tone. Coordination normal.  Skin: Skin is warm and dry. There is erythema.  4 cm area of erythema and induration to distal left upper arm. Moderate fluctuance.  Nursing note and vitals reviewed.    ED Course  Procedures (including critical care time) DIAGNOSTIC STUDIES: Oxygen Saturation is 99% on RA, normal by my interpretation.   COORDINATION OF CARE: 6:42 PM- Will have Dr. Rubin PayorPickering examine patient to decide appropriate course of treatment. Pt verbalizes understanding and agrees to plan.  6:45 PM- Dr. Rubin PayorPickering at bedside to examine patient.  6:59 PM- Will I & D abscess.  INCISION AND DRAINAGE PROCEDURE NOTE: Patient identification was confirmed and verbal consent was obtained. This procedure was performed by Pauline Ausammy Ayden Hardwick, PA-C at 7:51 PM. Site: distal left upper arm Sterile procedures observed Needle size: 25 G Anesthetic used (type and amt): Lidocaine 1% without Epinephrine (2.5 mLs) Blade size: 11 Drainage: minimal Complexity: Complex Packing used: 1/4 inch Iodoform Site anesthetized, incision made over site, wound drained and explored loculations, rinsed with copious amounts of normal saline, wound packed with sterile gauze, covered with dry, sterile dressing.  Pt tolerated procedure well without complications.  Instructions for care discussed verbally and pt provided with additional written instructions for homecare and f/u.  Medications - No data to display  Labs Review Labs Reviewed - No data to display  Imaging Review No results found.   EKG Interpretation None      MDM   Final diagnoses:  Abscess of arm, left    Pt with reported improving abscess to the left upper arm.  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  Mild surrounding  cellulitis.  NV intact.  Pt also seen y Dr. Rubin PayorPickering and  care plan discussed.  Abscess viewed with US by Dr. Rubin PayorPickering.  Will obtain CBC and blood cultures as well.  Pt is well appearing and non-toxic.    I personally performed the services described in this documentation, which was scribed in my presence. The recorded information has been reviewed and is accurate.    Ashyla Luth L. Trisha Mangleriplett, PA-C 11/02/14 1748  Juliet RudeNathan R. Rubin PayorPickering, MD 11/02/14 [phone]

## 2014-11-02 ENCOUNTER — Encounter (HOSPITAL_COMMUNITY): Payer: Self-pay | Admitting: *Deleted

## 2014-11-02 ENCOUNTER — Emergency Department (HOSPITAL_COMMUNITY)
Admission: EM | Admit: 2014-11-02 | Discharge: 2014-11-02 | Disposition: A | Discharge status: Home / Self Care | Payer: Self-pay | Attending: Emergency Medicine | Admitting: Emergency Medicine

## 2014-11-02 DIAGNOSIS — Z4801 Encounter for change or removal of surgical wound dressing: Secondary | ICD-10-CM | POA: Insufficient documentation

## 2014-11-02 DIAGNOSIS — F419 Anxiety disorder, unspecified: Secondary | ICD-10-CM | POA: Insufficient documentation

## 2014-11-02 DIAGNOSIS — Z792 Long term (current) use of antibiotics: Secondary | ICD-10-CM | POA: Insufficient documentation

## 2014-11-02 DIAGNOSIS — Z720 Tobacco use: Secondary | ICD-10-CM | POA: Insufficient documentation

## 2014-11-02 DIAGNOSIS — Z760 Encounter for issue of repeat prescription: Secondary | ICD-10-CM | POA: Insufficient documentation

## 2014-11-02 DIAGNOSIS — G8929 Other chronic pain: Secondary | ICD-10-CM | POA: Insufficient documentation

## 2014-11-02 DIAGNOSIS — Z79899 Other long term (current) drug therapy: Secondary | ICD-10-CM | POA: Insufficient documentation

## 2014-11-02 DIAGNOSIS — Z8619 Personal history of other infectious and parasitic diseases: Secondary | ICD-10-CM | POA: Insufficient documentation

## 2014-11-02 DIAGNOSIS — Z880 Allergy status to penicillin: Secondary | ICD-10-CM | POA: Insufficient documentation

## 2014-11-02 DIAGNOSIS — Z09 Encounter for follow-up examination after completed treatment for conditions other than malignant neoplasm: Secondary | ICD-10-CM

## 2014-11-02 MED ORDER — IBUPROFEN 800 MG PO TABS: 800.0000 mg | ORAL_TABLET | Freq: Three times a day (TID) | ORAL | Status: DC | PRN

## 2014-11-02 MED ORDER — TRAMADOL HCL 50 MG PO TABS: 50.0000 mg | ORAL_TABLET | Freq: Four times a day (QID) | ORAL | Status: DC | PRN

## 2014-11-02 NOTE — ED Provider Notes (Signed)
TIME SEEN: 2:13 PM  CHIEF COMPLAINT: wound check   HPI: Patient is a 28 y.o. F history of hepatitis C, anxiety who was here 2 days ago to have an abscess drained on the volar aspect of her left forearm. She is here today to have the wound packing removed. Patient denies fevers. She reports that she has been compliant with antibiotics and home care instructions. No nausea, vomiting or diarrhea. She feels like the wound is getting better.  ROS: See HPI Constitutional: no fever  Eyes: no drainage  ENT: no runny nose   Cardiovascular:  no chest pain  Resp: no SOB  GI: no vomiting GU: no dysuria Integumentary: no rash  Allergy: no hives  Musculoskeletal: no leg swelling  Neurological: no slurred speech ROS otherwise negative  PAST MEDICAL HISTORY/PAST SURGICAL HISTORY:  Past Medical History  Diagnosis Date  . Chronic shoulder pain   . Anxiety   . Hepatitis C     MEDICATIONS:  Prior to Admission medications   Medication Sig Start Date End Date Taking? Authorizing Provider  clonazePAM (KLONOPIN) 1 MG tablet Take 1 mg by mouth 3 (three) times daily as needed. For anxiety    Historical Provider, MD  cloNIDine (CATAPRES) 0.2 MG tablet Take 1 tablet (0.2 mg total) by mouth 2 (two) times daily. Patient not taking: Reported on 10/31/2014 09/14/14   Gerhard Munchobert Lockwood, MD  oxyCODONE (ROXICODONE) 5 MG immediate release tablet Take 1 tablet (5 mg total) by mouth every 6 (six) hours as needed for severe pain. 10/31/14   Tammy L. Triplett, PA-C  pantoprazole (PROTONIX) 40 MG tablet Take 1 tablet (40 mg total) by mouth daily. Patient not taking: Reported on 10/31/2014 06/07/14   Nita SellsMaryann Mikhail, DO  promethazine (PHENERGAN) 25 MG tablet Take 1 tablet (25 mg total) by mouth every 6 (six) hours as needed. 07/03/14   Vanetta MuldersScott Zackowski, MD  sulfamethoxazole-trimethoprim (SEPTRA DS) 800-160 MG per tablet Take 1 tablet by mouth 2 (two) times daily. For 14 days 10/31/14   Tammy L. Triplett, PA-C    ALLERGIES:   Allergies  Allergen Reactions  . Penicillins Hives and Other (See Comments)    headaches  . Tylenol [Acetaminophen]   . Venlafaxine Hives and Other (See Comments)    Headaches    . Morphine And Related Hives and Other (See Comments)    Reaction: headaches    SOCIAL HISTORY:  History  Substance Use Topics  . Smoking status: Current Every Day Smoker -- 1.00 packs/day    Types: Cigarettes  . Smokeless tobacco: Not on file  . Alcohol Use: No    FAMILY HISTORY: Family History  Problem Relation Age of Onset  . Cirrhosis Father   . Hepatitis C Father     EXAM: BP 116/68 mmHg  Pulse 71  Temp(Src) 99 F (37.2 C) (Oral)  Resp 18  Ht 5\' 7"  (1.702 m)  Wt 126 lb (57.153 kg)  BMI 19.73 kg/m2  SpO2 100% CONSTITUTIONAL: Alert and oriented and responds appropriately to questions. Well-appearing; well-nourished, nontoxic HEAD: Normocephalic EYES: Conjunctivae clear, PERRL ENT: normal nose; no rhinorrhea; moist mucous membranes; pharynx without lesions noted NECK: Supple, no meningismus, no LAD  CARD: RRR; S1 and S2 appreciated; no murmurs, no clicks, no rubs, no gallops RESP: Normal chest excursion without splinting or tachypnea; breath sounds clear and equal bilaterally; no wheezes, no rhonchi, no rales,  ABD/GI: Normal bowel sounds; non-distended; soft, non-tender, no rebound, no guarding BACK:  The back appears normal and is non-tender  to palpation, there is no CVA tenderness EXT: Normal ROM in all joints; non-tender to palpation; no edema; normal capillary refill; no cyanosis    SKIN: Normal color for age and race; warm; 1cm open lesion to the volar aspect of the left arm with associated packing; no surrounding eryythema or warmth, no fluctuance  NEURO: Moves all extremities equally PSYCH: The patient's mood and manner are appropriate. Grooming and personal hygiene are appropriate.  MEDICAL DECISION MAKING: Pt here for a wound recheck after she had an abscess incised and  drained 2 days ago. No systemic symptoms. No signs of surrounding cellulitis. We'll have her continue her antibiotics. Packing removed. Have discussed with her using warm compresses and warm soaks. Have discussed return precautions. She is asking for more pain medicine. We'll discharge with 8 tablets of tramadol and ibuprofen.        Layla MawKristen N Shakhia Gramajo, DO 11/02/14 1429

## 2014-11-02 NOTE — Discharge Instructions (Signed)

## 2014-11-02 NOTE — ED Notes (Signed)
Pt had abscess drained on lt arm 2 days ago, here to have wound packing removed. Denies fevers, states she has been taking her antibiotics.

## 2014-11-03 ENCOUNTER — Telehealth (HOSPITAL_COMMUNITY): Payer: Self-pay

## 2014-11-03 LAB — CULTURE, ROUTINE-ABSCESS

## 2014-11-05 LAB — CULTURE, BLOOD (ROUTINE X 2)
Culture: NO GROWTH
Culture: NO GROWTH

## 2014-11-06 ENCOUNTER — Telehealth (HOSPITAL_COMMUNITY): Payer: Self-pay

## 2014-11-06 NOTE — Telephone Encounter (Signed)
Post ED Visit - Positive Culture Follow-up  Culture report reviewed by antimicrobial stewardship pharmacist: []  Wes Dulaney, Pharm.D., BCPS [x]  Celedonio MiyamotoJeremy Frens, Pharm.D., BCPS []  Georgina PillionElizabeth Martin, Pharm.D., BCPS []  TustinMinh Pham, [address] BoulevardPharm.D., BCPS, AAHIVP []  Estella HuskMichelle Turner, Pharm.D., BCPS, AAHIVP []  Babs BertinHaley Baird, Pharm.D.   Positive Abscess culture, MRSA Treated with Sulfa-Trimeth, organism sensitive to the same and no further patient follow-up is required at this time.  Arvid RightClark, Galit Urich Dorn 11/06/2014, 5:19 AM

## 2014-12-24 ENCOUNTER — Emergency Department (HOSPITAL_COMMUNITY): Payer: Self-pay

## 2014-12-24 ENCOUNTER — Emergency Department (HOSPITAL_COMMUNITY)
Admission: EM | Admit: 2014-12-24 | Discharge: 2014-12-25 | Disposition: A | Discharge status: Home / Self Care | Payer: Self-pay | Attending: Emergency Medicine | Admitting: Emergency Medicine

## 2014-12-24 ENCOUNTER — Encounter (HOSPITAL_COMMUNITY): Payer: Self-pay | Admitting: *Deleted

## 2014-12-24 DIAGNOSIS — Z7982 Long term (current) use of aspirin: Secondary | ICD-10-CM | POA: Insufficient documentation

## 2014-12-24 DIAGNOSIS — Z9889 Other specified postprocedural states: Secondary | ICD-10-CM | POA: Insufficient documentation

## 2014-12-24 DIAGNOSIS — S63502A Unspecified sprain of left wrist, initial encounter: Secondary | ICD-10-CM

## 2014-12-24 DIAGNOSIS — Z720 Tobacco use: Secondary | ICD-10-CM | POA: Insufficient documentation

## 2014-12-24 DIAGNOSIS — Z880 Allergy status to penicillin: Secondary | ICD-10-CM | POA: Insufficient documentation

## 2014-12-24 DIAGNOSIS — Z3202 Encounter for pregnancy test, result negative: Secondary | ICD-10-CM | POA: Insufficient documentation

## 2014-12-24 DIAGNOSIS — S20219A Contusion of unspecified front wall of thorax, initial encounter: Secondary | ICD-10-CM

## 2014-12-24 DIAGNOSIS — G8929 Other chronic pain: Secondary | ICD-10-CM | POA: Insufficient documentation

## 2014-12-24 DIAGNOSIS — S20229A Contusion of unspecified back wall of thorax, initial encounter: Secondary | ICD-10-CM | POA: Insufficient documentation

## 2014-12-24 DIAGNOSIS — Z79899 Other long term (current) drug therapy: Secondary | ICD-10-CM | POA: Insufficient documentation

## 2014-12-24 DIAGNOSIS — Y998 Other external cause status: Secondary | ICD-10-CM | POA: Insufficient documentation

## 2014-12-24 DIAGNOSIS — S300XXA Contusion of lower back and pelvis, initial encounter: Secondary | ICD-10-CM

## 2014-12-24 DIAGNOSIS — Z8619 Personal history of other infectious and parasitic diseases: Secondary | ICD-10-CM | POA: Insufficient documentation

## 2014-12-24 DIAGNOSIS — Y9289 Other specified places as the place of occurrence of the external cause: Secondary | ICD-10-CM | POA: Insufficient documentation

## 2014-12-24 DIAGNOSIS — S0990XA Unspecified injury of head, initial encounter: Secondary | ICD-10-CM

## 2014-12-24 DIAGNOSIS — Y9389 Activity, other specified: Secondary | ICD-10-CM | POA: Insufficient documentation

## 2014-12-24 DIAGNOSIS — S9002XA Contusion of left ankle, initial encounter: Secondary | ICD-10-CM

## 2014-12-24 DIAGNOSIS — F419 Anxiety disorder, unspecified: Secondary | ICD-10-CM | POA: Insufficient documentation

## 2014-12-24 MED ORDER — SODIUM CHLORIDE 0.9 % IV BOLUS (SEPSIS): 1000.0000 mL | Freq: Once | INTRAVENOUS | Status: DC

## 2014-12-24 MED ORDER — KETOROLAC TROMETHAMINE 30 MG/ML IJ SOLN
INTRAMUSCULAR | Status: DC
Start: 2014-12-24 — End: 2014-12-25
  Filled 2014-12-24: qty 1

## 2014-12-24 MED ORDER — IBUPROFEN 800 MG PO TABS
800.0000 mg | ORAL_TABLET | Freq: Once | ORAL | Status: DC
  Filled 2014-12-24: qty 1
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
## 2014-12-25 LAB — RAPID URINE DRUG SCREEN, HOSP PERFORMED
Amphetamines: POSITIVE — AB
Barbiturates: NOT DETECTED
Benzodiazepines: POSITIVE — AB
Cocaine: NOT DETECTED
Opiates: NOT DETECTED
Tetrahydrocannabinol: POSITIVE — AB

## 2014-12-25 LAB — URINALYSIS, ROUTINE W REFLEX MICROSCOPIC
Bilirubin Urine: NEGATIVE
Glucose, UA: NEGATIVE mg/dL
Hgb urine dipstick: NEGATIVE
KETONES UR: NEGATIVE mg/dL
LEUKOCYTES UA: NEGATIVE
Nitrite: NEGATIVE
PROTEIN: NEGATIVE mg/dL
Specific Gravity, Urine: <1.005 — ABNORMAL LOW (ref 1.005–1.030)
Urobilinogen, UA: 0.2 mg/dL (ref 0.0–1.0)
pH: 5.5 (ref 5.0–8.0)

## 2014-12-25 LAB — PREGNANCY, URINE: PREG TEST UR: NEGATIVE

## 2014-12-25 MED ORDER — KETOROLAC TROMETHAMINE 60 MG/2ML IM SOLN
INTRAMUSCULAR | Status: AC
  Administered 2014-12-25
  Filled 2014-12-25: qty 2

## 2014-12-25 MED ORDER — ONDANSETRON 4 MG PO TBDP
ORAL_TABLET | ORAL | Status: AC
  Filled 2014-12-25: qty 1

## 2014-12-25 NOTE — Discharge Instructions (Signed)
Take ibuprofen 600 mg every 6 hours as needed for pain.  Follow-up with your primary Dr. if not improving in the next few days.   Assault, General Assault includes any behavior, whether intentional or reckless, which results in bodily injury to another person and/or damage to property. Included in this would be any behavior, intentional or reckless, that by its nature would be understood (interpreted) by a reasonable person as intent to harm another person or to damage his/her property. Threats may be oral or written. They may be communicated through regular mail, computer, fax, or phone. These threats may be direct or implied. FORMS OF ASSAULT INCLUDE:  Physically assaulting a person. This includes physical threats to inflict physical harm as well as:  Slapping.  Hitting.  Poking.  Kicking.  Punching.  Pushing.  Arson.  Sabotage.  Equipment vandalism.  Damaging or destroying property.  Throwing or hitting objects.  Displaying a weapon or an object that appears to be a weapon in a threatening manner.  Carrying a firearm of any kind.  Using a weapon to harm someone.  Using greater physical size/strength to intimidate another.  Making intimidating or threatening gestures.  Bullying.  Hazing.  Intimidating, threatening, hostile, or abusive language directed toward another person.  It communicates the intention to engage in violence against that person. And it leads a reasonable person to expect that violent behavior may occur.  Stalking another person. IF IT HAPPENS AGAIN:  Immediately call for emergency help (911 in [place]  If someone poses clear and immediate danger to you, seek legal authorities to have a protective or restraining order put in place.  Less threatening assaults can at least be reported to authorities. STEPS TO TAKE IF A SEXUAL ASSAULT HAS HAPPENED  Go to an area of safety. This may include a shelter or staying with a friend. Stay away from  the area where you have been attacked. A large percentage of sexual assaults are caused by a friend, relative or associate.  If medications were given by your caregiver, take them as directed for the full length of time prescribed.  Only take over-the-counter or prescription medicines for pain, discomfort, or fever as directed by your caregiver.  If you have come in contact with a sexual disease, find out if you are to be tested again. If your caregiver is concerned about the HIV/AIDS virus, he/she may require you to have continued testing for several months.  For the protection of your privacy, test results can not be given over the phone. Make sure you receive the results of your test. If your test results are not back during your visit, make an appointment with your caregiver to find out the results. Do not assume everything is normal if you have not heard from your caregiver or the medical facility. It is important for you to follow up on all of your test results.  File appropriate papers with authorities. This is important in all assaults, even if it has occurred in a family or by a friend. SEEK MEDICAL CARE IF:  You have new problems because of your injuries.  You have problems that may be because of the medicine you are taking, such as:  Rash.  Itching.  Swelling.  Trouble breathing.  You develop belly (abdominal) pain, feel sick to your stomach (nausea) or are vomiting.  You begin to run a temperature.  You need supportive care or referral to a rape crisis center. These are centers with trained personnel who can  help you get through this ordeal. SEEK IMMEDIATE MEDICAL CARE IF:  You are afraid of being threatened, beaten, or abused. In [place] call 911.  You receive new injuries related to abuse.  You develop severe pain in any area injured in the assault or have any change in your condition that concerns you.  You faint or lose consciousness.  You develop chest pain or  shortness of breath. Document Released: 12/02/2005 Document Revised: 02/24/2012 Document Reviewed: 07/20/2008 [facility] Patient Information 2015 [place], [place]. This information is not intended to replace advice given to you by your health care provider. Make sure you discuss any questions you have with your health care provider.  Head Injury You have received a head injury. It does not appear serious at this time. Headaches and vomiting are common following head injury. It should be easy to awaken from sleeping. Sometimes it is necessary for you to stay in the emergency department for a while for observation. Sometimes admission to the hospital may be needed. After injuries such as yours, most problems occur within the first 24 hours, but side effects may occur up to 7-10 days after the injury. It is important for you to carefully monitor your condition and contact your health care provider or seek immediate medical care if there is a change in your condition. WHAT ARE THE TYPES OF HEAD INJURIES? Head injuries can be as minor as a bump. Some head injuries can be more severe. More severe head injuries include:  A jarring injury to the brain (concussion).  A bruise of the brain (contusion). This mean there is bleeding in the brain that can cause swelling.  A cracked skull (skull fracture).  Bleeding in the brain that collects, clots, and forms a bump (hematoma). WHAT CAUSES A HEAD INJURY? A serious head injury is most likely to happen to someone who is in a car wreck and is not wearing a seat belt. Other causes of major head injuries include bicycle or motorcycle accidents, sports injuries, and falls. HOW ARE HEAD INJURIES DIAGNOSED? A complete history of the event leading to the injury and your current symptoms will be helpful in diagnosing head injuries. Many times, pictures of the brain, such as CT or MRI are needed to see the extent of the injury. Often, an overnight hospital stay is necessary  for observation.  WHEN SHOULD I SEEK IMMEDIATE MEDICAL CARE?  You should get help right away if:  You have confusion or drowsiness.  You feel sick to your stomach (nauseous) or have continued, forceful vomiting.  You have dizziness or unsteadiness that is getting worse.  You have severe, continued headaches not relieved by medicine. Only take over-the-counter or prescription medicines for pain, fever, or discomfort as directed by your health care provider.  You do not have normal function of the arms or legs or are unable to walk.  You notice changes in the black spots in the center of the colored part of your eye (pupil).  You have a clear or bloody fluid coming from your nose or ears.  You have a loss of vision. During the next 24 hours after the injury, you must stay with someone who can watch you for the warning signs. This person should contact local emergency services (911 in the [place] if you have seizures, you become unconscious, or you are unable to wake up. HOW CAN I PREVENT A HEAD INJURY IN THE FUTURE? The most important factor for preventing major head injuries is avoiding motor vehicle  accidents. To minimize the potential for damage to your head, it is crucial to wear seat belts while riding in motor vehicles. Wearing helmets while bike riding and playing collision sports (like football) is also helpful. Also, avoiding dangerous activities around the house will further help reduce your risk of head injury.  WHEN CAN I RETURN TO NORMAL ACTIVITIES AND ATHLETICS? You should be reevaluated by your health care provider before returning to these activities. If you have any of the following symptoms, you should not return to activities or contact sports until 1 week after the symptoms have stopped:  Persistent headache.  Dizziness or vertigo.  Poor attention and concentration.  Confusion.  Memory problems.  Nausea or vomiting.  Fatigue or tire  easily.  Irritability.  Intolerant of bright lights or loud noises.  Anxiety or depression.  Disturbed sleep. MAKE SURE YOU:   Understand these instructions.  Will watch your condition.  Will get help right away if you are not doing well or get worse. Document Released: 12/02/2005 Document Revised: 12/07/2013 Document Reviewed: 08/09/2013 [facility] Patient Information 2015 [place], [place]. This information is not intended to replace advice given to you by your health care provider. Make sure you discuss any questions you have with your health care provider.

## 2014-12-25 NOTE — ED Notes (Signed)
Patient in room standing on her feet and stated that they were broken. Informed her to stay in her bed until her xrays are completed.

## 2015-01-11 ENCOUNTER — Emergency Department (HOSPITAL_COMMUNITY)
Admission: EM | Admit: 2015-01-11 | Discharge: 2015-01-11 | Disposition: A | Discharge status: Home / Self Care | Payer: Self-pay | Attending: Emergency Medicine | Admitting: Emergency Medicine

## 2015-01-11 ENCOUNTER — Encounter (HOSPITAL_COMMUNITY): Payer: Self-pay | Admitting: Cardiology

## 2015-01-11 DIAGNOSIS — L03113 Cellulitis of right upper limb: Secondary | ICD-10-CM | POA: Insufficient documentation

## 2015-01-11 DIAGNOSIS — F419 Anxiety disorder, unspecified: Secondary | ICD-10-CM | POA: Insufficient documentation

## 2015-01-11 DIAGNOSIS — Z720 Tobacco use: Secondary | ICD-10-CM | POA: Insufficient documentation

## 2015-01-11 DIAGNOSIS — Z8619 Personal history of other infectious and parasitic diseases: Secondary | ICD-10-CM | POA: Insufficient documentation

## 2015-01-11 DIAGNOSIS — M71031 Abscess of bursa, right wrist: Secondary | ICD-10-CM | POA: Insufficient documentation

## 2015-01-11 DIAGNOSIS — Z79899 Other long term (current) drug therapy: Secondary | ICD-10-CM | POA: Insufficient documentation

## 2015-01-11 DIAGNOSIS — Z880 Allergy status to penicillin: Secondary | ICD-10-CM | POA: Insufficient documentation

## 2015-01-11 DIAGNOSIS — R000 Tachycardia, unspecified: Secondary | ICD-10-CM | POA: Insufficient documentation

## 2015-01-11 DIAGNOSIS — Z3202 Encounter for pregnancy test, result negative: Secondary | ICD-10-CM | POA: Insufficient documentation

## 2015-01-11 DIAGNOSIS — G8929 Other chronic pain: Secondary | ICD-10-CM | POA: Insufficient documentation

## 2015-01-11 LAB — BASIC METABOLIC PANEL
Anion gap: 6 (ref 5–15)
BUN: 15 mg/dL (ref 6–23)
CO2: 30 mmol/L (ref 19–32)
Calcium: 9.4 mg/dL (ref 8.4–10.5)
Chloride: 100 mmol/L (ref 96–112)
Creatinine, Ser: 0.72 mg/dL (ref 0.50–1.10)
GFR calc Af Amer: >90 mL/min (ref >90)
GFR calc non Af Amer: >90 mL/min (ref >90)
Glucose, Bld: 115 mg/dL — ABNORMAL HIGH (ref 70–99)
Potassium: 3.9 mmol/L (ref 3.5–5.1)
Sodium: 136 mmol/L (ref 135–145)

## 2015-01-11 LAB — CBC WITH DIFFERENTIAL/PLATELET
BASOS PCT: 0 % (ref 0–1)
Basophils Absolute: 0 10*3/uL (ref 0.0–0.1)
Eosinophils Absolute: 0.1 10*3/uL (ref 0.0–0.7)
Eosinophils Relative: 2 % (ref 0–5)
HEMATOCRIT: 42.1 % (ref 36.0–46.0)
Hemoglobin: 13.4 g/dL (ref 12.0–15.0)
Lymphocytes Relative: 26 % (ref 12–46)
Lymphs Abs: 1.9 10*3/uL (ref 0.7–4.0)
MCH: 25.9 pg — ABNORMAL LOW (ref 26.0–34.0)
MCHC: 31.8 g/dL (ref 30.0–36.0)
MCV: 81.4 fL (ref 78.0–100.0)
MONO ABS: 0.5 10*3/uL (ref 0.1–1.0)
Monocytes Relative: 7 % (ref 3–12)
Neutro Abs: 4.7 10*3/uL (ref 1.7–7.7)
Neutrophils Relative %: 65 % (ref 43–77)
PLATELETS: 224 10*3/uL (ref 150–400)
RBC: 5.17 MIL/uL — ABNORMAL HIGH (ref 3.87–5.11)
RDW: 13.6 % (ref 11.5–15.5)
WBC: 7.2 10*3/uL (ref 4.0–10.5)

## 2015-01-11 LAB — RAPID URINE DRUG SCREEN, HOSP PERFORMED
AMPHETAMINES: POSITIVE — AB
BENZODIAZEPINES: POSITIVE — AB
Barbiturates: NOT DETECTED
Cocaine: NOT DETECTED
Opiates: POSITIVE — AB
TETRAHYDROCANNABINOL: POSITIVE — AB

## 2015-01-11 LAB — POC URINE PREG, ED: Preg Test, Ur: NEGATIVE

## 2015-01-11 MED ORDER — OXYCODONE HCL 5 MG PO TABS: 5.0000 mg | ORAL_TABLET | ORAL | Status: DC | PRN

## 2015-01-11 MED ORDER — HYDROMORPHONE HCL 1 MG/ML IJ SOLN
1.0000 mg | Freq: Once | INTRAMUSCULAR | Status: AC
  Administered 2015-01-11: 1 mg via INTRAVENOUS

## 2015-01-11 MED ORDER — SULFAMETHOXAZOLE-TRIMETHOPRIM 800-160 MG PO TABS: 1.0000 | ORAL_TABLET | Freq: Two times a day (BID) | ORAL | Status: DC

## 2015-01-11 MED ORDER — ONDANSETRON HCL 4 MG/2ML IJ SOLN
INTRAMUSCULAR | Status: AC
  Filled 2015-01-11: qty 2

## 2015-01-11 MED ORDER — VANCOMYCIN HCL IN DEXTROSE 1-5 GM/200ML-% IV SOLN
1000.0000 mg | Freq: Once | INTRAVENOUS | Status: AC
  Administered 2015-01-11: 1000 mg via INTRAVENOUS
  Filled 2015-01-11: qty 200

## 2015-01-11 MED ORDER — HYDROMORPHONE HCL 1 MG/ML IJ SOLN
INTRAMUSCULAR | Status: AC
  Filled 2015-01-11: qty 1

## 2015-01-11 MED ORDER — POVIDONE-IODINE 10 % EX SOLN
CUTANEOUS | Status: AC
  Administered 2015-01-11: 17:00:00
  Filled 2015-01-11: qty 118

## 2015-01-11 MED ORDER — LIDOCAINE HCL (PF) 1 % IJ SOLN
INTRAMUSCULAR | Status: AC
  Administered 2015-01-11: 17:00:00
  Filled 2015-01-11: qty 5

## 2015-01-11 MED ORDER — ONDANSETRON HCL 4 MG/2ML IJ SOLN
4.0000 mg | Freq: Once | INTRAMUSCULAR | Status: AC
  Administered 2015-01-11: 4 mg via INTRAVENOUS

## 2015-01-11 NOTE — ED Notes (Signed)
Reddened area to right wrist since yesterday.

## 2015-01-11 NOTE — ED Notes (Addendum)
I&D tray at bedside. NP aware. Lab also at bedside.

## 2015-01-11 NOTE — Discharge Instructions (Signed)
Return tomorrow for recheck. Do not take the narcotic if driving as it will make you sleepy.   Abscess An abscess (boil or furuncle) is an infected area on or under the skin. This area is filled with yellowish-white fluid (pus) and other material (debris). HOME CARE   Only take medicines as told by your doctor.  If you were given antibiotic medicine, take it as directed. Finish the medicine even if you start to feel better.  If gauze is used, follow your doctor's directions for changing the gauze.  To avoid spreading the infection:  Keep your abscess covered with a bandage.  Wash your hands well.  Do not share personal care items, towels, or whirlpools with others.  Avoid skin contact with others.  Keep your skin and clothes clean around the abscess.  Keep all doctor visits as told. GET HELP RIGHT AWAY IF:   You have more pain, puffiness (swelling), or redness in the wound site.  You have more fluid or blood coming from the wound site.  You have muscle aches, chills, or you feel sick.  You have a fever. MAKE SURE YOU:   Understand these instructions.  Will watch your condition.  Will get help right away if you are not doing well or get worse. Document Released: 05/20/2008 Document Revised: 06/02/2012 Document Reviewed: 02/14/2012 [facility] Patient Information 2015 [place], [place]. This information is not intended to replace advice given to you by your health care provider. Make sure you discuss any questions you have with your health care provider.

## 2015-01-11 NOTE — ED Provider Notes (Signed)
CSN: [id number]     Arrival date & time 01/11/15  1522 History   First MD Initiated Contact with Patient 01/11/15 1533     Chief Complaint  Patient presents with  . Abscess     (Consider location/radiation/quality/duration/timing/severity/associated sxs/prior Treatment) Patient is a 29 y.o. female presenting with abscess. The history is provided by the patient.  Abscess Location:  Hand Hand abscess location:  R wrist Abscess quality: painful and redness   Red streaking: yes   Duration:  2 days Progression:  Worsening Pain details:    Quality:  Throbbing and shooting   Severity:  Severe   Timing:  Constant   Progression:  Worsening Chronicity:  New Relieved by:  Nothing Worsened by:  Nothing tried  Heidi [name] is a 29 y.o. female who presents to the ED with right wrist pain. She states that a few days ago she felt a burning sensation in her wrist and thought maybe she had just hit it against something. Yesterday she noted redness, swelling and increased pain. Today the redness goes up her forearm. Hx of Hepatitis C and Cirrhosis.   Past Medical History  Diagnosis Date  . Chronic shoulder pain   . Anxiety   . Hepatitis C    Past Surgical History  Procedure Laterality Date  . Ankle surgery     Family History  Problem Relation Age of Onset  . Cirrhosis Father   . Hepatitis C Father    History  Substance Use Topics  . Smoking status: Current Every Day Smoker -- 1.00 packs/day    Types: Cigarettes  . Smokeless tobacco: Not on file  . Alcohol Use: No   OB History    No data available     Review of Systems  Skin:       Abscess with red streaking right forearm  All other systems negative    Allergies  Penicillins; Tylenol; Venlafaxine; and Morphine and related  Home Medications   Prior to Admission medications   Medication Sig Start Date End Date Taking? Authorizing Provider  clonazePAM (KLONOPIN) 1 MG tablet Take 1 mg by mouth 3 (three) times daily as  needed. For anxiety   Yes Historical Provider, MD  cloNIDine (CATAPRES) 0.2 MG tablet Take 1 tablet (0.2 mg total) by mouth 2 (two) times daily. Patient not taking: Reported on 10/31/2014 09/14/14   Gerhard Munch, MD  ibuprofen (ADVIL,MOTRIN) 800 MG tablet Take 1 tablet (800 mg total) by mouth every 8 (eight) hours as needed for mild pain. Patient not taking: Reported on 01/11/2015 11/02/14   Kristen N Ward, DO  oxyCODONE (OXY IR/ROXICODONE) 5 MG immediate release tablet Take 1 tablet (5 mg total) by mouth every 4 (four) hours as needed for severe pain. 01/11/15   Anabella Capshaw Orlene Och, NP  pantoprazole (PROTONIX) 40 MG tablet Take 1 tablet (40 mg total) by mouth daily. Patient not taking: Reported on 10/31/2014 06/07/14   Nita Sells Mikhail, DO  promethazine (PHENERGAN) 25 MG tablet Take 1 tablet (25 mg total) by mouth every 6 (six) hours as needed. Patient not taking: Reported on 01/11/2015 07/03/14   Vanetta Mulders, MD  sulfamethoxazole-trimethoprim (BACTRIM DS,SEPTRA DS) 800-160 MG per tablet Take 1 tablet by mouth 2 (two) times daily. 01/11/15 01/18/15  Keyandre Pileggi Orlene Och, NP   BP 105/58 mmHg  Pulse 109  Temp(Src) 98.2 F (36.8 C) (Oral)  Resp 18  Ht  (1.702 m)  Wt 120 lb (54.432 kg)  BMI 18.79 kg/m2  SpO2 100% Physical  Exam  Constitutional: She is oriented to person, place, and time. She appears well-developed and well-nourished.  HENT:  Head: Normocephalic and atraumatic.  Eyes: EOM are normal.  Neck: Neck supple.  Cardiovascular: Tachycardia present.   Pulmonary/Chest: Effort normal.  Musculoskeletal:       Right wrist: She exhibits tenderness and swelling. She exhibits no deformity. Decreased range of motion: due to pain.       Arms: Red, raised, tender area to the right wrist with red streaking of the forearm almost to the elbow, palmar aspect. Radial pulse strong, good touch sensation.   Neurological: She is alert and oriented to person, place, and time. No cranial nerve deficit.  Skin:  Skin is warm and dry.  Psychiatric: She has a normal mood and affect. Her behavior is normal.  Nursing note and vitals reviewed.   ED Course  Procedures (including critical care time) INCISION AND DRAINAGE Performed by: Aeisha Minarik Consent: Verbal consent obtained. Risks and benefits: risks, benefits and alternatives were discussed Type: abscess  Body area: right wrist  Cleaned with betadine and draped  Anesthesia: local infiltration  Local anesthetic: lidocaine 1% without epinephrine  Anesthetic total: 2 ml  Incision made with # 11 blade, straight  Complexity: complex Blunt dissection to break up loculations  Drainage: purulent/bloody  Drainage amount: small  Packing material: none  Patient tolerance: Patient tolerated the procedure well with no immediate complications.    Labs Review Results for orders placed or performed during the hospital encounter of 01/11/15 (from the past 24 hour(s))  CBC with Differential/Platelet     Status: Abnormal   Collection Time: 01/11/15  3:59 PM  Result Value Ref Range   WBC 7.2 4.0 - 10.5 K/uL   RBC 5.17 (H) 3.87 - 5.11 MIL/uL   Hemoglobin 13.4 12.0 - 15.0 g/dL   HCT 40.942.1 81.136.0 - 91.446.0 %   MCV 81.4 78.0 - 100.0 fL   MCH 25.9 (L) 26.0 - 34.0 pg   MCHC 31.8 30.0 - 36.0 g/dL   RDW 78.213.6 95.611.5 - 21.315.5 %   Platelets 224 150 - 400 K/uL   Neutrophils Relative % 65 43 - 77 %   Neutro Abs 4.7 1.7 - 7.7 K/uL   Lymphocytes Relative 26 12 - 46 %   Lymphs Abs 1.9 0.7 - 4.0 K/uL   Monocytes Relative 7 3 - 12 %   Monocytes Absolute 0.5 0.1 - 1.0 K/uL   Eosinophils Relative 2 0 - 5 %   Eosinophils Absolute 0.1 0.0 - 0.7 K/uL   Basophils Relative 0 0 - 1 %   Basophils Absolute 0.0 0.0 - 0.1 K/uL  Basic metabolic panel     Status: Abnormal   Collection Time: 01/11/15  3:59 PM  Result Value Ref Range   Sodium 136 135 - 145 mmol/L   Potassium 3.9 3.5 - 5.1 mmol/L   Chloride 100 96 - 112 mmol/L   CO2 30 19 - 32 mmol/L   Glucose, Bld 115 (H)  70 - 99 mg/dL   BUN 15 6 - 23 mg/dL   Creatinine, Ser 0.860.72 0.50 - 1.10 mg/dL   Calcium 9.4 8.4 - 57.810.5 mg/dL   GFR calc non Af Amer >90 >90 mL/min   GFR calc Af Amer >90 >90 mL/min   Anion gap 6 5 - 15  POC urine preg, [facility])     Status: None   Collection Time: 01/11/15  5:16 PM  Result Value Ref Range   Preg  Test, Ur NEGATIVE NEGATIVE    Dr. Estell Harpin in to examine the patient. Will give Vancomycin 1 gram IV now and pain management. She will return in the morning for recheck.  MDM  29 y.o. female with abscess to the right wrist and cellulitis extending to the forearm that started 2 days ago. Stable for discharge without fever and with normal CBC. Will d/c with pre pack of oxycodone and Rx for Bactrim DS. She agrees to return in the morning for follow up.   Final diagnoses:  Abscess of bursa, right wrist  Cellulitis of forearm, right      Janne Napoleon, NP 01/11/15 1719  Benny Lennert, MD 01/11/15 [id number]

## 2015-01-12 ENCOUNTER — Encounter (HOSPITAL_COMMUNITY): Payer: Self-pay | Admitting: Emergency Medicine

## 2015-01-12 ENCOUNTER — Emergency Department (HOSPITAL_COMMUNITY)
Admission: EM | Admit: 2015-01-12 | Discharge: 2015-01-12 | Disposition: A | Discharge status: Home / Self Care | Payer: Self-pay | Attending: Emergency Medicine | Admitting: Emergency Medicine

## 2015-01-12 DIAGNOSIS — Z5189 Encounter for other specified aftercare: Secondary | ICD-10-CM

## 2015-01-12 DIAGNOSIS — B1920 Unspecified viral hepatitis C without hepatic coma: Secondary | ICD-10-CM | POA: Insufficient documentation

## 2015-01-12 DIAGNOSIS — Z4801 Encounter for change or removal of surgical wound dressing: Secondary | ICD-10-CM | POA: Insufficient documentation

## 2015-01-12 DIAGNOSIS — F17210 Nicotine dependence, cigarettes, uncomplicated: Secondary | ICD-10-CM | POA: Insufficient documentation

## 2015-01-12 DIAGNOSIS — L02413 Cutaneous abscess of right upper limb: Secondary | ICD-10-CM | POA: Insufficient documentation

## 2015-01-12 DIAGNOSIS — F419 Anxiety disorder, unspecified: Secondary | ICD-10-CM | POA: Insufficient documentation

## 2015-01-12 DIAGNOSIS — M25519 Pain in unspecified shoulder: Secondary | ICD-10-CM | POA: Insufficient documentation

## 2015-01-12 DIAGNOSIS — G8929 Other chronic pain: Secondary | ICD-10-CM | POA: Insufficient documentation

## 2015-01-12 MED ORDER — SULFAMETHOXAZOLE-TRIMETHOPRIM 800-160 MG PO TABS: 1.0000 | ORAL_TABLET | Freq: Two times a day (BID) | ORAL | Status: AC

## 2015-01-12 MED ORDER — SULFAMETHOXAZOLE-TRIMETHOPRIM 800-160 MG PO TABS
1.0000 | ORAL_TABLET | Freq: Once | ORAL | Status: AC
  Administered 2015-01-12: 1 via ORAL
  Filled 2015-01-12: qty 1

## 2015-01-12 MED ORDER — OXYCODONE HCL 5 MG PO TABS
5.0000 mg | ORAL_TABLET | Freq: Once | ORAL | Status: AC
  Administered 2015-01-12: 5 mg via ORAL
  Filled 2015-01-12: qty 1

## 2015-01-12 MED ORDER — OXYCODONE HCL 5 MG PO CAPS: 5.0000 mg | ORAL_CAPSULE | Freq: Four times a day (QID) | ORAL | Status: DC | PRN

## 2015-01-12 NOTE — ED Notes (Signed)
Covered wound with non-adhesive dressing and wrapped with sterile gauze. Patient tolerated well.

## 2015-01-12 NOTE — Discharge Instructions (Signed)
Your wound is progressing nicely.  Wound Check Your wound appears healthy today. Your wound will heal gradually over time. Eventually a scar will form that will fade with time. FACTORS THAT AFFECT SCAR FORMATION:  People differ in the severity in which they scar.  Scar severity varies according to location, size, and the traits you inherited from your parents (genetic predisposition).  Irritation to the wound from infection, rubbing, or chemical exposure will increase the amount of scar formation. HOME CARE INSTRUCTIONS   If you were given a dressing, you should change it at least once a day or as instructed by your caregiver. If the bandage sticks, soak it off with a solution of hydrogen peroxide.  If the bandage becomes wet, dirty, or develops a bad smell, change it as soon as possible.  Look for signs of infection.  Only take over-the-counter or prescription medicines for pain, discomfort, or fever as directed by your caregiver. SEEK IMMEDIATE MEDICAL CARE IF:   You have redness, swelling, or increasing pain in the wound.  You notice pus coming from the wound.  You have a fever.  You notice a bad smell coming from the wound or dressing. Document Released: 09/07/2004 Document Revised: 02/24/2012 Document Reviewed: 12/02/2005 [facility]tExitCare Patient Information 2015 [place], [place]. This information is not intended to replace advice given to you by your health care provider. Make sure you discuss any questions you have with your health care provider. Please cleanse your wound daily with soap and water. Please apply fresh dressing daily. Please use Septra 2 times daily with food until all taken. Use ibuprofen for mild pain, use Roxicodone for more severe pain. Roxicodone may cause drowsiness, and/or constipation. Please use with caution.

## 2015-01-12 NOTE — ED Provider Notes (Addendum)
CSN: [id number]     Arrival date & time 01/12/15  1818 History   First MD Initiated Contact with Patient 01/12/15 1831     Chief Complaint  Patient presents with  . Medical Clearance     (Consider location/radiation/quality/duration/timing/severity/associated sxs/prior Treatment) HPI Comments: Pt is a 29 y/o female who present to the ED for wound check and medical clearance [facility] Pt had incision and drainage of a cyst to the right wrist on yesterday in  The ED. She was placed on bactrim DS. No significant risk factors, no diabetes. No compromise to immune system. No reported high fever, or changes in function of the right upper extremity.  The history is provided by the patient.    Past Medical History  Diagnosis Date  . Chronic shoulder pain   . Anxiety   . Hepatitis C    Past Surgical History  Procedure Laterality Date  . Ankle surgery     Family History  Problem Relation Age of Onset  . Cirrhosis Father   . Hepatitis C Father    History  Substance Use Topics  . Smoking status: Current Every Day Smoker -- 1.00 packs/day    Types: Cigarettes  . Smokeless tobacco: Not on file  . Alcohol Use: No   OB History    No data available     Review of Systems  Constitutional: Negative for activity change.       All ROS Neg except as noted in HPI  HENT: Negative.   Eyes: Negative for photophobia and discharge.  Respiratory: Negative for cough, shortness of breath and wheezing.   Cardiovascular: Negative for chest pain and palpitations.  Gastrointestinal: Negative for abdominal pain and blood in stool.  Genitourinary: Negative for dysuria, frequency and hematuria.  Musculoskeletal: Positive for arthralgias. Negative for back pain and neck pain.  Skin: Positive for wound.  Neurological: Negative for dizziness, seizures and speech difficulty.  Psychiatric/Behavioral: Negative for hallucinations and confusion. The patient is nervous/anxious.        Allergies  Penicillins; Tylenol; Venlafaxine; and Morphine and related  Home Medications   Prior to Admission medications   Medication Sig Start Date End Date Taking? Authorizing Provider  clonazePAM (KLONOPIN) 1 MG tablet Take 1 mg by mouth 3 (three) times daily as needed. For anxiety    Historical Provider, MD  cloNIDine (CATAPRES) 0.2 MG tablet Take 1 tablet (0.2 mg total) by mouth 2 (two) times daily. Patient not taking: Reported on 10/31/2014 09/14/14   Gerhard Munchobert Lockwood, MD  ibuprofen (ADVIL,MOTRIN) 800 MG tablet Take 1 tablet (800 mg total) by mouth every 8 (eight) hours as needed for mild pain. Patient not taking: Reported on 01/11/2015 11/02/14   Kristen N Ward, DO  oxycodone (OXY-IR) 5 MG capsule Take 1 capsule (5 mg total) by mouth every 6 (six) hours as needed. 01/12/15   Kathie DikeHobson M Rasheen Schewe, PA-C  pantoprazole (PROTONIX) 40 MG tablet Take 1 tablet (40 mg total) by mouth daily. Patient not taking: Reported on 10/31/2014 06/07/14   Nita SellsMaryann Mikhail, DO  promethazine (PHENERGAN) 25 MG tablet Take 1 tablet (25 mg total) by mouth every 6 (six) hours as needed. Patient not taking: Reported on 01/11/2015 07/03/14   Vanetta MuldersScott Zackowski, MD  sulfamethoxazole-trimethoprim (BACTRIM DS,SEPTRA DS) 800-160 MG per tablet Take 1 tablet by mouth 2 (two) times daily. 01/12/15 01/19/15  Kathie DikeHobson M Jeri Rawlins, PA-C   BP 113/60 mmHg  Pulse 102  Temp(Src) 97.8 F (36.6 C) (Oral)  Resp  16  Ht  (1.702 m)  Wt 120 lb (54.432 kg)  BMI 18.79 kg/m2  SpO2 100% Physical Exam  Constitutional: She is oriented to person, place, and time. She appears well-developed and well-nourished.  Non-toxic appearance.  HENT:  Head: Normocephalic.  Right Ear: Tympanic membrane and external ear normal.  Left Ear: Tympanic membrane and external ear normal.  Eyes: EOM and lids are normal. Pupils are equal, round, and reactive to light.  Neck: Normal range of motion. Neck supple. Carotid bruit is not present.  Cardiovascular:  Normal rate, regular rhythm, normal heart sounds, intact distal pulses and normal pulses.   Pulmonary/Chest: Breath sounds normal. No respiratory distress.  Abdominal: Soft. Bowel sounds are normal. There is no tenderness. There is no guarding.  Musculoskeletal: Normal range of motion.  Dressing to the wound of the right wrist forearm was removed. The area marked with previous increased redness and swelling has resolved significantly. There continues to be drainage from the incision and drainage site. There is no red streaks appreciated. There is soreness to palpation.  Radial pulses are 2+ bilaterally. Capillary refill is less than 2 seconds.  Lymphadenopathy:       Head (right side): No submandibular adenopathy present.       Head (left side): No submandibular adenopathy present.    She has no cervical adenopathy.  Neurological: She is alert and oriented to person, place, and time. She has normal strength. No cranial nerve deficit or sensory deficit.  No gross motor or sensory deficits appreciated involving the right or left upper extremity.  Skin: Skin is warm and dry.  Psychiatric: She has a normal mood and affect. Her speech is normal.  Nursing note and vitals reviewed.   ED Course  Procedures (including critical care time) Labs Review Labs Reviewed - No data to display  Imaging Review No results found.   EKG Interpretation None      MDM  The abscess incision and drainage area of the right wrist forearm is progressing nicely. The area is not hot, the redness and swelling has diminished significantly, and there remains good range of motion of the wrists, and fingers.  I have suggested to the patient that she have her dressing changed daily. She clinically the wound area with soap and water daily. That she finish her Septra DS as scheduled. The patient is in the custody of the local Sheriff, and on her way to detention. Prescription for Septra and Roxicodone 5 mg given to the  patient.    Final diagnoses:  Wound check, abscess    **I have reviewed nursing notes, vital signs, and all appropriate lab and imaging results for this patient.Kathie Dike, PA-C 01/12/15 2005  Flint Melter, MD 01/14/15 [address] [place], PA-C 01/24/15 1251  Flint Melter, MD 01/24/15 [phone]

## 2015-01-12 NOTE — ED Notes (Signed)
Cyst to right wrist.  Had cyst drained yesterday ([facility]).  Pt need to medically cleared before going to jail.

## 2015-01-14 LAB — CULTURE, ROUTINE-ABSCESS

## 2015-01-31 ENCOUNTER — Emergency Department (HOSPITAL_COMMUNITY)
Admission: EM | Admit: 2015-01-31 | Discharge: 2015-01-31 | Disposition: A | Discharge status: Home / Self Care | Payer: Self-pay | Attending: Emergency Medicine | Admitting: Emergency Medicine

## 2015-01-31 ENCOUNTER — Encounter (HOSPITAL_COMMUNITY): Payer: Self-pay | Admitting: Emergency Medicine

## 2015-01-31 DIAGNOSIS — Z79899 Other long term (current) drug therapy: Secondary | ICD-10-CM | POA: Insufficient documentation

## 2015-01-31 DIAGNOSIS — F1310 Sedative, hypnotic or anxiolytic abuse, uncomplicated: Secondary | ICD-10-CM | POA: Insufficient documentation

## 2015-01-31 DIAGNOSIS — F329 Major depressive disorder, single episode, unspecified: Secondary | ICD-10-CM | POA: Insufficient documentation

## 2015-01-31 DIAGNOSIS — Z880 Allergy status to penicillin: Secondary | ICD-10-CM | POA: Insufficient documentation

## 2015-01-31 DIAGNOSIS — F1910 Other psychoactive substance abuse, uncomplicated: Secondary | ICD-10-CM

## 2015-01-31 DIAGNOSIS — G8929 Other chronic pain: Secondary | ICD-10-CM | POA: Insufficient documentation

## 2015-01-31 DIAGNOSIS — F419 Anxiety disorder, unspecified: Secondary | ICD-10-CM | POA: Insufficient documentation

## 2015-01-31 DIAGNOSIS — Z720 Tobacco use: Secondary | ICD-10-CM | POA: Insufficient documentation

## 2015-01-31 DIAGNOSIS — Z8619 Personal history of other infectious and parasitic diseases: Secondary | ICD-10-CM | POA: Insufficient documentation

## 2015-01-31 DIAGNOSIS — F1210 Cannabis abuse, uncomplicated: Secondary | ICD-10-CM | POA: Insufficient documentation

## 2015-01-31 DIAGNOSIS — F1110 Opioid abuse, uncomplicated: Secondary | ICD-10-CM | POA: Insufficient documentation

## 2015-01-31 LAB — BASIC METABOLIC PANEL
Anion gap: 7 (ref 5–15)
BUN: 11 mg/dL (ref 6–23)
CALCIUM: 9.4 mg/dL (ref 8.4–10.5)
CO2: 27 mmol/L (ref 19–32)
Chloride: 104 mmol/L (ref 96–112)
Creatinine, Ser: 0.56 mg/dL (ref 0.50–1.10)
GFR calc Af Amer: >90 mL/min (ref >90)
GLUCOSE: 97 mg/dL (ref 70–99)
Potassium: 3.9 mmol/L (ref 3.5–5.1)
Sodium: 138 mmol/L (ref 135–145)

## 2015-01-31 LAB — CBC WITH DIFFERENTIAL/PLATELET
BASOS ABS: 0 10*3/uL (ref 0.0–0.1)
BASOS PCT: 0 % (ref 0–1)
Eosinophils Absolute: 0 10*3/uL (ref 0.0–0.7)
Eosinophils Relative: 1 % (ref 0–5)
HCT: 35.9 % — ABNORMAL LOW (ref 36.0–46.0)
Hemoglobin: 11.8 g/dL — ABNORMAL LOW (ref 12.0–15.0)
Lymphocytes Relative: 17 % (ref 12–46)
Lymphs Abs: 1.1 10*3/uL (ref 0.7–4.0)
MCH: 26.5 pg (ref 26.0–34.0)
MCHC: 32.9 g/dL (ref 30.0–36.0)
MCV: 80.7 fL (ref 78.0–100.0)
Monocytes Absolute: 0.5 10*3/uL (ref 0.1–1.0)
Monocytes Relative: 7 % (ref 3–12)
NEUTROS ABS: 4.9 10*3/uL (ref 1.7–7.7)
Neutrophils Relative %: 75 % (ref 43–77)
PLATELETS: 167 10*3/uL (ref 150–400)
RBC: 4.45 MIL/uL (ref 3.87–5.11)
RDW: 13.4 % (ref 11.5–15.5)
WBC: 6.5 10*3/uL (ref 4.0–10.5)

## 2015-01-31 LAB — RAPID URINE DRUG SCREEN, HOSP PERFORMED
Amphetamines: NOT DETECTED
Barbiturates: NOT DETECTED
Benzodiazepines: POSITIVE — AB
Cocaine: NOT DETECTED
Opiates: POSITIVE — AB
Tetrahydrocannabinol: POSITIVE — AB

## 2015-01-31 LAB — ETHANOL

## 2015-01-31 NOTE — Discharge Instructions (Signed)
Drug screen positive for opiates, benzodiazepines, marijuana.  [facility]

## 2015-01-31 NOTE — ED Provider Notes (Signed)
CSN: [id number]     Arrival date & time 01/31/15  1203 History  This chart was scribe for Donnetta HutchingBrian Marbeth Smedley, MD by Angelene GiovanniEmmanuella Mensah, ED Scribe. The patient was seen in room [facility]15[facility]15 and the patient's care was started at 2:16 PM.     Chief Complaint  Patient presents with  . Medical Clearance   The history is provided by the patient. No language interpreter was used.   HPI Comments: Heidi [name] is a 29 y.o. female who presents to the Emergency Department for voluntary medical screening for [facility]. She reports that she was taking 1 mg Clonazepam 3 times and 5-30 mg Roxicodone 4 - 5 times a day, as well as taking heroin. She states that she wants to detox in order to get her children back.  No suicidal or homicidal ideation. No psychosis. Severity is moderate.   Past Medical History  Diagnosis Date  . Chronic shoulder pain   . Anxiety   . Hepatitis C    Past Surgical History  Procedure Laterality Date  . Ankle surgery     Family History  Problem Relation Age of Onset  . Cirrhosis Father   . Hepatitis C Father    History  Substance Use Topics  . Smoking status: Current Every Day Smoker -- 1.00 packs/day    Types: Cigarettes  . Smokeless tobacco: Not on file  . Alcohol Use: No   OB History    No data available     Review of Systems  Constitutional: Negative for fever.  Gastrointestinal: Negative for nausea, vomiting and abdominal pain.  Psychiatric/Behavioral: Negative for suicidal ideas.  All other systems reviewed and are negative.     Allergies  Penicillins; Tylenol; Venlafaxine; and Morphine and related  Home Medications   Prior to Admission medications   Medication Sig Start Date End Date Taking? Authorizing Provider  clonazePAM (KLONOPIN) 1 MG tablet Take 1 mg by mouth 3 (three) times daily as needed. For anxiety    Historical Provider, MD  cloNIDine (CATAPRES) 0.2 MG tablet Take 1 tablet (0.2 mg total) by mouth 2 (two) times  daily. Patient not taking: Reported on 10/31/2014 09/14/14   Gerhard Munchobert Lockwood, MD  ibuprofen (ADVIL,MOTRIN) 800 MG tablet Take 1 tablet (800 mg total) by mouth every 8 (eight) hours as needed for mild pain. Patient not taking: Reported on 01/11/2015 11/02/14   Kristen N Ward, DO  oxycodone (OXY-IR) 5 MG capsule Take 1 capsule (5 mg total) by mouth every 6 (six) hours as needed. 01/12/15   Kathie DikeHobson M Bryant, PA-C  pantoprazole (PROTONIX) 40 MG tablet Take 1 tablet (40 mg total) by mouth daily. Patient not taking: Reported on 10/31/2014 06/07/14   Nita SellsMaryann Mikhail, DO  promethazine (PHENERGAN) 25 MG tablet Take 1 tablet (25 mg total) by mouth every 6 (six) hours as needed. Patient not taking: Reported on 01/11/2015 07/03/14   Vanetta MuldersScott Zackowski, MD   BP 103/71 mmHg  Temp(Src) 98.5 F (36.9 C) (Oral)  Resp 18  Ht 5\' 7"  (1.702 m)  Wt 114 lb (51.71 kg)  BMI 17.85 kg/m2  SpO2 100% Physical Exam  Constitutional: She is oriented to person, place, and time. She appears well-developed and well-nourished.  HENT:  Head: Normocephalic and atraumatic.  Eyes: Conjunctivae and EOM are normal. Pupils are equal, round, and reactive to light.  Neck: Normal range of motion. Neck supple.  Cardiovascular: Normal rate and regular rhythm.   Pulmonary/Chest: Effort normal and breath sounds normal.  Abdominal: Soft.  Bowel sounds are normal.  Musculoskeletal: Normal range of motion.  Neurological: She is alert and oriented to person, place, and time.  Skin: Skin is warm and dry.  Psychiatric:  Flat affect, depressed  Nursing note and vitals reviewed.   ED Course  Procedures (including critical care time) DIAGNOSTIC STUDIES: Oxygen Saturation is 100% on RA, normal by my interpretation.    COORDINATION OF CARE: 2:20 PM- Pt advised of plan for treatment and pt agrees.    Labs Review Labs Reviewed  CBC WITH DIFFERENTIAL/PLATELET - Abnormal; Notable for the following:    Hemoglobin 11.8 (*)    HCT 35.9 (*)    All  other components within normal limits  URINE RAPID DRUG SCREEN (HOSP PERFORMED) - Abnormal; Notable for the following:    Opiates POSITIVE (*)    Benzodiazepines POSITIVE (*)    Tetrahydrocannabinol POSITIVE (*)    All other components within normal limits  BASIC METABOLIC PANEL  ETHANOL    Imaging Review No results found.   EKG Interpretation None      MDM   Final diagnoses:  Polysubstance abuse   Patient is alert and oriented 3. No psychosis. Labs show positive drug screen for opiates, benzodiazepines, tetrahydrocannabinol. Referral to [facility]   I personally performed the services described in this documentation, which was scribed in my presence. The recorded information has been reviewed and is accurate.   Donnetta Hutching, MD 01/31/15 [phone]

## 2015-01-31 NOTE — ED Notes (Signed)
[facility].

## 2015-02-01 ENCOUNTER — Emergency Department (HOSPITAL_COMMUNITY)
Admission: EM | Admit: 2015-02-01 | Discharge: 2015-02-01 | Disposition: A | Discharge status: Home / Self Care | Payer: Self-pay | Attending: Emergency Medicine | Admitting: Emergency Medicine

## 2015-02-01 ENCOUNTER — Encounter (HOSPITAL_COMMUNITY): Payer: Self-pay | Admitting: Emergency Medicine

## 2015-02-01 DIAGNOSIS — F1910 Other psychoactive substance abuse, uncomplicated: Secondary | ICD-10-CM

## 2015-02-01 DIAGNOSIS — F1310 Sedative, hypnotic or anxiolytic abuse, uncomplicated: Secondary | ICD-10-CM | POA: Insufficient documentation

## 2015-02-01 DIAGNOSIS — Z880 Allergy status to penicillin: Secondary | ICD-10-CM | POA: Insufficient documentation

## 2015-02-01 DIAGNOSIS — G8929 Other chronic pain: Secondary | ICD-10-CM | POA: Insufficient documentation

## 2015-02-01 DIAGNOSIS — Z79899 Other long term (current) drug therapy: Secondary | ICD-10-CM | POA: Insufficient documentation

## 2015-02-01 DIAGNOSIS — F419 Anxiety disorder, unspecified: Secondary | ICD-10-CM | POA: Insufficient documentation

## 2015-02-01 DIAGNOSIS — Z720 Tobacco use: Secondary | ICD-10-CM | POA: Insufficient documentation

## 2015-02-01 DIAGNOSIS — F1210 Cannabis abuse, uncomplicated: Secondary | ICD-10-CM | POA: Insufficient documentation

## 2015-02-01 DIAGNOSIS — F1510 Other stimulant abuse, uncomplicated: Secondary | ICD-10-CM | POA: Insufficient documentation

## 2015-02-01 DIAGNOSIS — Z8619 Personal history of other infectious and parasitic diseases: Secondary | ICD-10-CM | POA: Insufficient documentation

## 2015-02-01 DIAGNOSIS — Z3202 Encounter for pregnancy test, result negative: Secondary | ICD-10-CM | POA: Insufficient documentation

## 2015-02-01 HISTORY — DX: Other psychoactive substance abuse, uncomplicated: F19.10

## 2015-02-01 LAB — RAPID URINE DRUG SCREEN, HOSP PERFORMED
Amphetamines: POSITIVE — AB
Barbiturates: NOT DETECTED
Benzodiazepines: POSITIVE — AB
Cocaine: NOT DETECTED
Opiates: POSITIVE — AB
TETRAHYDROCANNABINOL: POSITIVE — AB

## 2015-02-01 LAB — COMPREHENSIVE METABOLIC PANEL
ALT: 36 U/L — ABNORMAL HIGH (ref 0–35)
ANION GAP: 6 (ref 5–15)
AST: 22 U/L (ref 0–37)
Albumin: 4 g/dL (ref 3.5–5.2)
Alkaline Phosphatase: 71 U/L (ref 39–117)
BILIRUBIN TOTAL: 0.3 mg/dL (ref 0.3–1.2)
BUN: 10 mg/dL (ref 6–23)
CALCIUM: 9.2 mg/dL (ref 8.4–10.5)
CO2: 27 mmol/L (ref 19–32)
CREATININE: 0.61 mg/dL (ref 0.50–1.10)
Chloride: 105 mmol/L (ref 96–112)
GFR calc non Af Amer: >90 mL/min (ref >90)
Glucose, Bld: 116 mg/dL — ABNORMAL HIGH (ref 70–99)
Potassium: 3.4 mmol/L — ABNORMAL LOW (ref 3.5–5.1)
Sodium: 138 mmol/L (ref 135–145)
Total Protein: 7.2 g/dL (ref 6.0–8.3)

## 2015-02-01 LAB — CBC WITH DIFFERENTIAL/PLATELET
BASOS PCT: 0 % (ref 0–1)
Basophils Absolute: 0 10*3/uL (ref 0.0–0.1)
EOS PCT: 1 % (ref 0–5)
Eosinophils Absolute: 0.1 10*3/uL (ref 0.0–0.7)
HEMATOCRIT: 36.2 % (ref 36.0–46.0)
Hemoglobin: 11.7 g/dL — ABNORMAL LOW (ref 12.0–15.0)
Lymphocytes Relative: 25 % (ref 12–46)
Lymphs Abs: 1.5 10*3/uL (ref 0.7–4.0)
MCH: 26.4 pg (ref 26.0–34.0)
MCHC: 32.3 g/dL (ref 30.0–36.0)
MCV: 81.5 fL (ref 78.0–100.0)
MONO ABS: 0.4 10*3/uL (ref 0.1–1.0)
Monocytes Relative: 8 % (ref 3–12)
NEUTROS ABS: 3.9 10*3/uL (ref 1.7–7.7)
Neutrophils Relative %: 66 % (ref 43–77)
PLATELETS: 185 10*3/uL (ref 150–400)
RBC: 4.44 MIL/uL (ref 3.87–5.11)
RDW: 13.4 % (ref 11.5–15.5)
WBC: 5.9 10*3/uL (ref 4.0–10.5)

## 2015-02-01 LAB — ETHANOL

## 2015-02-01 LAB — PREGNANCY, URINE: PREG TEST UR: NEGATIVE

## 2015-02-01 MED ORDER — DICYCLOMINE HCL 10 MG/ML IM SOLN
20.0000 mg | Freq: Once | INTRAMUSCULAR | Status: AC
  Administered 2015-02-01: 20 mg via INTRAMUSCULAR
  Filled 2015-02-01: qty 2

## 2015-02-01 MED ORDER — PROMETHAZINE HCL 25 MG PO TABS: 25.0000 mg | ORAL_TABLET | Freq: Four times a day (QID) | ORAL | Status: DC | PRN

## 2015-02-01 MED ORDER — DICYCLOMINE HCL 20 MG PO TABS: 20.0000 mg | ORAL_TABLET | Freq: Four times a day (QID) | ORAL | Status: DC | PRN

## 2015-02-01 MED ORDER — ONDANSETRON 8 MG PO TBDP
8.0000 mg | ORAL_TABLET | Freq: Once | ORAL | Status: AC
  Administered 2015-02-01: 8 mg via ORAL
  Filled 2015-02-01: qty 1

## 2015-02-01 NOTE — Discharge Instructions (Signed)
°Emergency Department Resource Guide °1) Find a Doctor and Pay Out of Pocket °Although you won't have to find out who is covered by your insurance plan, it is a good idea to ask around and get recommendations. You will then need to call the office and see if the doctor you have chosen will accept you as a new patient and what types of options they offer for patients who are self-pay. Some doctors offer discounts or will set up payment plans for their patients who do not have insurance, but you will need to ask so you aren't surprised when you get to your appointment. ° °2) Contact Your Local Health Department °Not all health departments have doctors that can see patients for sick visits, but many do, so it is worth a call to see if yours does. If you don't know where your local health department is, you can check in your phone book. The CDC also has a tool to help you locate your state's health department, and many state websites also have listings of all of their local health departments. ° °3) Find a Walk-in Clinic °If your illness is not likely to be very severe or complicated, you may want to try a walk in clinic. These are popping up all over the country in pharmacies, drugstores, and shopping centers. They're usually staffed by nurse practitioners or physician assistants that have been trained to treat common illnesses and complaints. They're usually fairly quick and inexpensive. However, if you have serious medical issues or chronic medical problems, these are probably not your best option. ° °No Primary Care Doctor: °- Call [facility] at  832-8000 - they can help you locate a primary care doctor that  accepts your insurance, provides certain services, etc. °- Physician Referral Service- 1-800-533-3463 ° °Chronic Pain Problems: °Organization         Address  Phone   Notes  °[facility]  (336) 297-2271 Patients need to be referred by their primary care doctor.  ° °Medication  Assistance: °Organization         Address  Phone   Notes  °[place] [facility] [address] °[place], [place] [address] (336) 641-8030 --Must be a resident of [place] °-- Must have NO insurance coverage whatsoever (no Medicaid/ Medicare, etc.) °-- The pt. MUST have a primary care doctor that directs their care regularly and follows them in the community °  °[facility]  (866) 331-1348   °[employer]  (888) 892-1162   ° °Agencies that provide inexpensive medical care: °Organization         Address  Phone   Notes  °[facility]  (336) 832-8035   °[facility]    (336) 832-7272   °[facility] [address] °[place], [place] [address] (336) 832-4777   °[facility] [address], °[place] (336) 271-4999   °[facility]    (336) 373-0678   °[facility]    (336) 272-1050   °[facility] ° [address], [place] Phone:  (336) 832-4444, Fax:  (336) 832-4440 Hours of Operation:  9 am - 6 pm, M-F.  Also accepts Medicaid/Medicare and self-pay.  °[facility] ° [address], [place] Phone: (336) 832-3150, Fax: (336) [phone]. Hours of Operation:  8:30 am - 5:30 pm, M-F.  Also accepts Medicaid and self-pay.  °[facility] [place] [address], [place] Phone: (336) 878-6027   °[facility] [address], [place], [place] (336)723-1848, Ext. 123 Mondays & Thursdays: 7-9 AM.  First 15 patients are seen on a first come, first serve basis. °  ° °Medicaid-accepting [place] Providers: ° °Organization         Address  Phone   Notes  °[facility] [address], [place] (336) 641-2100 Also accepts self-pay patients.  °[facility] [address], [place] ° (336) 856-9996   °[facility] [address], [place]  (336) 288-8857   °[facility] [address] [place] [address], [place] (336) 299-7000   °Veita Bland [address], [place]  ° (336) 373-1557 Only accepts [place] Access Medicaid patients after they have their name applied to their card.  ° °Self-Pay (no insurance) in [place]: ° °Organization         Address  Phone   Notes  °Sickle Cell Patients, [facility] [address], [place] (336) 832-1970   °[facility] [address], [place] (336) 832-4400   °[facility] ° [address], [place] (336) 992-4800   °[facility]/Dr. Osei-Bonsu ° [address] [place] [address], [place] or [address], [place] (336) 841-8500 Phone number for both [place] and [place] locations is the same.  °[facility] [address], [place] (336) 299-0000   °[facility] [address] [place] [address], [place] or [address] (336) 852-7530 °(336) 878-2260   °[facility] [address], [place] (336) 350-1642, phone; (336) 294-5005, fax Sees patients 1st and 3rd Saturday of every month.  Must not qualify for public or private insurance (i.e. Medicaid, Medicare, [place] Health Choice, Veterans' Benefits) • Household income should be no more than 200% of the poverty level •The clinic cannot treat you if you are pregnant or think you are pregnant • Sexually transmitted diseases are not treated at the clinic.  ° ° °Dental Care: °Organization         Address  Phone  Notes  °[place] [facility] [address], [place] (336) 641-6152 Accepts children up to age 21 who are enrolled in Medicaid or [place] Health Choice; pregnant women with a Medicaid card; and children who have applied for Medicaid or [place] Health Choice, but were declined, whose parents can pay a reduced fee at time of service.  °[place]  [facility] [place]  [address], [place] (336) 641-7733 Accepts children up to age 21 who are enrolled in Medicaid or [place] Health Choice; pregnant women with a Medicaid card; and children who have applied for Medicaid or [place] Health Choice, but were declined, whose parents can pay a reduced fee at time of service.  °[facility] ° [address], [place] (336) 641-4533 Patients are seen by appointment only. Walk-ins are not accepted. [facility] will see patients 18 years of age and older. °Monday - Tuesday (8am-5pm) °Most Wednesdays (8:30-5pm) °$30 per visit, cash only  °[facility] ° [address], [place] (336) 641-4533 Patients are seen by appointment only. Walk-ins are not accepted. [facility] will see patients 18 years of age and older. °One   Wednesday Evening (Monthly: Volunteer Based).  $30 per visit, cash only  °[facility]  (919) 537-3737 for adults; Children under age 4, call Graduate Pediatric Dentistry at ([phone]) [phone]. Children aged 4-14, please call ([phone]) [phone] to request a pediatric application. ° Dental services are provided in all areas of dental care including fillings, crowns and bridges, complete and partial dentures, implants, gum treatment, root canals, and extractions. Preventive care is also provided. Treatment is provided to both adults and children. °Patients are selected via a lottery and there is often a waiting list. °  °[facility] [address], °[place] ° (336) 763-8833 [email or web address] °  °[facility] [address], [place], [place] (336)723-1848, Ext. 123 Second and Fourth Thursday of each month, opens at 6:30 AM; Clinic ends at 9 AM.  Patients are seen on a first-come first-served basis, and a limited number are seen during each clinic.  ° °[facility] ° [address], [place], [place] (336) 723-7904    Eligibility Requirements °You must have lived in [place], [place], or [place] counties for at least the last three months. °  You cannot be eligible for state or federal sponsored healthcare insurance, including Veterans Administration, Medicaid, or Medicare. °  You generally cannot be eligible for healthcare insurance through your employer.  °  How to apply: °Eligibility screenings are held every Tuesday and Wednesday afternoon from 1:00 pm until 4:00 pm. You do not need an appointment for the interview!  °[facility] [address], [place], [place] 336-631-2330   °[facility]  336-342-8273   °[facility]  336-703-3100   °[facility]  336-570-6415   ° °Behavioral Health Resources in the Community: °Intensive Outpatient Programs °Organization         Address  Phone  Notes  °[facility] [address], [place], [place] 336-878-6098   °[facility] [address], [place], [place] 336-832-9800   °[facility] [address], [place], [place] ° 336-882-2125   °[facility] [address],  °[place], [place] 1-800-853-5163 or 336-641-4981   °Substance Abuse Resources °Organization         Address  Phone  Notes  °[facility]  336-882-2125   °[facility]  336-784-9470   °[facility]  336-285-9073   °[facility]  336-845-3988   °Residential & Outpatient Substance Abuse Program  1-800-659-3381   °Psychological Services °Organization         Address  Phone  Notes  °[facility]  336- 832-9600   °[facility]  336- 378-7881   °[facility] [address], [place] 1-800-853-5163 or 336-641-4981   ° °Mobile Crisis Teams °Organization         Address  Phone  Notes  °[facility], Mobile Crisis Care Unit  1-877-626-1772   °Assertive °[facility] ° [address]  [place], [place] 336-834-9664   °Sharon DeEsch [address] °[place] 336-554-5454   ° °Self-Help/Support Groups °Organization         Address  Phone             Notes  °[facility]  - variety of support groups  336- 373-1402 Call for more information  °Narcotics Anonymous (NA), [facility] [address], °[place]  2 meetings at this location  ° °  Residential Treatment Programs Organization         Address  Phone  Notes  [facility] [address],    [place]  [phone]   [facility]  [address], [place] [phone], [place], [place] [phone]   [facility] [address], [place] [phone] Admissions: 8am-3pm M-F  [facility] [address]    [place], [place] [phone])[phone]   [facility] [address], [place], [place] [phone]   [facility] [address]  [place], [place] [phone]   [facility] Intensive Outpatient [address], [place], [place] [phone]   [facility] [address]  [place], [place] [phone]-(267)451-2123 or [phone]   [facility] ([facility] [address] [place], [place] [phone] Accepts Medicaid  [facility] [address]  [place] [phone] Substance Abuse/Addiction Treatment   [facility]         Address  Phone  Notes  [facility]  217-045-5115(888) (901) [phone]   Angie FavaJulie Brannon, PhD [address], [address] [place], [place]   [phone](336) ([phone] or [phone](336) [phone]   [facility]   [address] [place], [place] [phone](336) (561) [phone]   [facility] [address], [place], [place] [phone](336) [phone] Insurance/Medicaid/sponsorship through [facility] [address]                                    [place], [place] [phone](336) [phone] Therapy/tele-psych/case    [facility] [address]   [place], [place] ([phone](336) [phone]    Dr. Lolly MustacheArfeen  ([phone]) [phone](336) [phone]   [facility] [place]  [employer] [facility] 1) [address], [place] 2) [address], [place] 3)  [address], [place] [phone](336) ([phone](336) [phone]  [phone](336) [phone]   [facility] (401)850-6324(336) 843-032-2754 or 832-085-6025(336) (781)[phone] (After Hours)      Take the prescriptions as directed.  Call the outpatient resources given to you today follow up regarding starting a detox program. Return to the Emergency Department immediately sooner if worsening.

## 2015-02-01 NOTE — ED Notes (Signed)
Call received from TTS, pt does not meet Inpatient criteria. recommends outpatient treatment. Call transferred to EDP.
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
## 2015-02-01 NOTE — ED Notes (Signed)
Pt reports she is a patient of [facility] and wants help detoxing from heroin and benzos. States her last heroin use was Friday and she last used Roxy last night.

## 2015-02-01 NOTE — ED Notes (Signed)
Pt c/o nausea, denies auditory and visual hallucinations.

## 2015-02-01 NOTE — ED Notes (Signed)
telepsyc in process. 

## 2015-02-01 NOTE — ED Provider Notes (Signed)
CSN: [id number]     Arrival date & time 02/01/15  1905 History   First MD Initiated Contact with Patient 02/01/15 1935     Chief Complaint  Patient presents with  . V70.1      HPI Per pt, c/o gradual onset and persistence of constant polysubstance abuse for the past several years. Pt states she uses opiates, heroin, benzos, marijuana, and etoh. LD heroin Friday, LD oxycodone last night. Pt states she "wants to go to detox" so she came to the ED for evaluation. Denies SI, no HI, no hallucinations.    Past Medical History  Diagnosis Date  . Chronic shoulder pain   . Anxiety   . Hepatitis C   . Polysubstance abuse    Past Surgical History  Procedure Laterality Date  . Ankle surgery     Family History  Problem Relation Age of Onset  . Cirrhosis Father   . Hepatitis C Father    History  Substance Use Topics  . Smoking status: Current Every Day Smoker -- 1.00 packs/day    Types: Cigarettes  . Smokeless tobacco: Not on file  . Alcohol Use: Yes    Review of Systems ROS: Statement: All systems negative except as marked or noted in the HPI; Constitutional: Negative for fever and chills. ; ; Eyes: Negative for eye pain, redness and discharge. ; ; ENMT: Negative for ear pain, hoarseness, nasal congestion, sinus pressure and sore throat. ; ; Cardiovascular: Negative for chest pain, palpitations, diaphoresis, dyspnea and peripheral edema. ; ; Respiratory: Negative for cough, wheezing and stridor. ; ; Gastrointestinal: Negative for nausea, vomiting, diarrhea, abdominal pain, blood in stool, hematemesis, jaundice and rectal bleeding. . ; ; Genitourinary: Negative for dysuria, flank pain and hematuria. ; ; Musculoskeletal: Negative for back pain and neck pain. Negative for swelling and trauma.; ; Skin: Negative for pruritus, rash, abrasions, blisters, bruising and skin lesion.; ; Neuro: Negative for headache, lightheadedness and neck stiffness. Negative for weakness, altered level of  consciousness , altered mental status, extremity weakness, paresthesias, involuntary movement, seizure and syncope. ; Psych:  No SI, no SA, no HI, no hallucinations.      Allergies  Penicillins; Tylenol; Venlafaxine; and Morphine and related  Home Medications   Prior to Admission medications   Medication Sig Start Date End Date Taking? Authorizing Provider  clonazePAM (KLONOPIN) 1 MG tablet Take 1 mg by mouth 3 (three) times daily as needed. For anxiety    Historical Provider, MD  cloNIDine (CATAPRES) 0.2 MG tablet Take 1 tablet (0.2 mg total) by mouth 2 (two) times daily. Patient not taking: Reported on 10/31/2014 09/14/14   Gerhard Munch, MD  dicyclomine (BENTYL) 20 MG tablet Take 1 tablet (20 mg total) by mouth every 6 (six) hours as needed for spasms (abdominal cramping). 02/01/15   Samuel Jester, DO  ibuprofen (ADVIL,MOTRIN) 800 MG tablet Take 1 tablet (800 mg total) by mouth every 8 (eight) hours as needed for mild pain. Patient not taking: Reported on 01/11/2015 11/02/14   Kristen N Ward, DO  oxycodone (OXY-IR) 5 MG capsule Take 1 capsule (5 mg total) by mouth every 6 (six) hours as needed. Patient not taking: Reported on 02/01/2015 01/12/15   Kathie Dike, PA-C  pantoprazole (PROTONIX) 40 MG tablet Take 1 tablet (40 mg total) by mouth daily. Patient not taking: Reported on 10/31/2014 06/07/14   Nita Sells Mikhail, DO  promethazine (PHENERGAN) 25 MG tablet Take 1 tablet (25 mg total) by mouth every 6 (six) hours as  needed for nausea or vomiting. 02/01/15   Samuel JesterKathleen Lenice Koper, DO   BP 99/64 mmHg  Pulse 99  Temp(Src) 98 F (36.7 C) (Oral)  Resp 20  Ht 5\' 7"  (1.702 m)  Wt 114 lb (51.71 kg)  BMI 17.85 kg/m2  SpO2 100% Physical Exam  Physical examination:  Nursing notes reviewed; Vital signs and O2 SAT reviewed;  Constitutional: Well developed, Well nourished, Well hydrated, In no acute distress; Head:  Normocephalic, atraumatic; Eyes: EOMI, PERRL, No scleral icterus; ENMT: Mouth and  pharynx normal, Mucous membranes moist; Neck: Supple, Full range of motion, No lymphadenopathy; Cardiovascular: Regular rate and rhythm, No murmur, rub, or gallop; Respiratory: Breath sounds clear & equal bilaterally, No rales, rhonchi, wheezes.  Speaking full sentences with ease, Normal respiratory effort/excursion; Chest: Nontender, Movement normal; Abdomen: Soft, Nontender, Nondistended, Normal bowel sounds; Extremities: Pulses normal, No tenderness, No edema, No calf edema or asymmetry.; Neuro: AA&Ox3, Major CN grossly intact.  Speech clear. No gross focal motor or sensory deficits in extremities. Climbs on and off stretcher easily by herself. Gait steady.; Skin: Color normal, Warm, Dry.; Psych:  Denies SI, no hallucinations.     ED Course  Procedures  EKG Interpretation None      MDM  MDM Reviewed: previous chart, nursing note and vitals Reviewed previous: labs Interpretation: labs     Results for orders placed or performed during the hospital encounter of 02/01/15  CBC with Differential  Result Value Ref Range   WBC 5.9 4.0 - 10.5 K/uL   RBC 4.44 3.87 - 5.11 MIL/uL   Hemoglobin 11.7 (L) 12.0 - 15.0 g/dL   HCT 14.736.2 82.936.0 - 56.246.0 %   MCV 81.5 78.0 - 100.0 fL   MCH 26.4 26.0 - 34.0 pg   MCHC 32.3 30.0 - 36.0 g/dL   RDW 13.013.4 86.511.5 - 78.415.5 %   Platelets 185 150 - 400 K/uL   Neutrophils Relative % 66 43 - 77 %   Neutro Abs 3.9 1.7 - 7.7 K/uL   Lymphocytes Relative 25 12 - 46 %   Lymphs Abs 1.5 0.7 - 4.0 K/uL   Monocytes Relative 8 3 - 12 %   Monocytes Absolute 0.4 0.1 - 1.0 K/uL   Eosinophils Relative 1 0 - 5 %   Eosinophils Absolute 0.1 0.0 - 0.7 K/uL   Basophils Relative 0 0 - 1 %   Basophils Absolute 0.0 0.0 - 0.1 K/uL  Ethanol  Result Value Ref Range   Alcohol, Ethyl (B) <5 0 - 9 mg/dL  Comprehensive metabolic panel  Result Value Ref Range   Sodium 138 135 - 145 mmol/L   Potassium 3.4 (L) 3.5 - 5.1 mmol/L   Chloride 105 96 - 112 mmol/L   CO2 27 19 - 32 mmol/L    Glucose, Bld 116 (H) 70 - 99 mg/dL   BUN 10 6 - 23 mg/dL   Creatinine, Ser 6.960.61 0.50 - 1.10 mg/dL   Calcium 9.2 8.4 - 29.510.5 mg/dL   Total Protein 7.2 6.0 - 8.3 g/dL   Albumin 4.0 3.5 - 5.2 g/dL   AST 22 0 - 37 U/L   ALT 36 (H) 0 - 35 U/L   Alkaline Phosphatase 71 39 - 117 U/L   Total Bilirubin 0.3 0.3 - 1.2 mg/dL   GFR calc non Af Amer >90 >90 mL/min   GFR calc Af Amer >90 >90 mL/min   Anion gap 6 5 - 15  Drug screen panel, emergency  Result Value Ref Range  Opiates POSITIVE (A) NONE DETECTED   Cocaine NONE DETECTED NONE DETECTED   Benzodiazepines POSITIVE (A) NONE DETECTED   Amphetamines POSITIVE (A) NONE DETECTED   Tetrahydrocannabinol POSITIVE (A) NONE DETECTED   Barbiturates NONE DETECTED NONE DETECTED  Pregnancy, urine  Result Value Ref Range   Preg Test, Ur NEGATIVE NEGATIVE    2300:  [facility] has evaluated pt: pt does not meet inpt criteria and can be discharged with outpt resources. After pt was told this, she then immediately c/o "withdrawal symptoms" (ie: nausea, chills, abd cramping) to the ED RN. Upon my arrival to her exam room, she was smiling and talking with her friends at the bedside.  Zofran and bentyl given with good affect. No obvious s/s of withdrawal. Pt has tol PO well without N/V. Ambulated with steady gait. Dx and testing d/w pt.  Questions answered.  Verb understanding, agreeable to d/c home with outpt f/u.     Samuel Jester, DO 02/04/15 [phone]

## 2015-02-01 NOTE — ED Notes (Signed)
Discharge instructions given, pt demonstrated teach back and verbal understanding. Discussed outpatient treatment facilities with patient and family.

## 2015-05-10 ENCOUNTER — Emergency Department (HOSPITAL_COMMUNITY)
Admission: EM | Admit: 2015-05-10 | Discharge: 2015-05-10 | Disposition: A | Discharge status: Home / Self Care | Payer: Self-pay | Attending: Emergency Medicine | Admitting: Emergency Medicine

## 2015-05-10 ENCOUNTER — Encounter (HOSPITAL_COMMUNITY): Payer: Self-pay | Admitting: Emergency Medicine

## 2015-05-10 DIAGNOSIS — L0291 Cutaneous abscess, unspecified: Secondary | ICD-10-CM

## 2015-05-10 DIAGNOSIS — G8929 Other chronic pain: Secondary | ICD-10-CM | POA: Insufficient documentation

## 2015-05-10 DIAGNOSIS — Z8619 Personal history of other infectious and parasitic diseases: Secondary | ICD-10-CM | POA: Insufficient documentation

## 2015-05-10 DIAGNOSIS — L02413 Cutaneous abscess of right upper limb: Secondary | ICD-10-CM | POA: Insufficient documentation

## 2015-05-10 DIAGNOSIS — Z720 Tobacco use: Secondary | ICD-10-CM | POA: Insufficient documentation

## 2015-05-10 DIAGNOSIS — Z880 Allergy status to penicillin: Secondary | ICD-10-CM | POA: Insufficient documentation

## 2015-05-10 DIAGNOSIS — Z79899 Other long term (current) drug therapy: Secondary | ICD-10-CM | POA: Insufficient documentation

## 2015-05-10 DIAGNOSIS — F419 Anxiety disorder, unspecified: Secondary | ICD-10-CM | POA: Insufficient documentation

## 2015-05-10 MED ORDER — LIDOCAINE-EPINEPHRINE (PF) 1 %-1:200000 IJ SOLN
10.0000 mL | Freq: Once | INTRAMUSCULAR | Status: AC
  Administered 2015-05-10: 10 mL
  Filled 2015-05-10: qty 10

## 2015-05-10 MED ORDER — TRAMADOL HCL 50 MG PO TABS: 50.0000 mg | ORAL_TABLET | Freq: Four times a day (QID) | ORAL | Status: DC | PRN

## 2015-05-10 MED ORDER — SULFAMETHOXAZOLE-TRIMETHOPRIM 800-160 MG PO TABS: 2.0000 | ORAL_TABLET | Freq: Two times a day (BID) | ORAL | Status: DC

## 2015-05-10 MED ORDER — SULFAMETHOXAZOLE-TRIMETHOPRIM 800-160 MG PO TABS
2.0000 | ORAL_TABLET | Freq: Once | ORAL | Status: AC
  Administered 2015-05-10: 2 via ORAL
  Filled 2015-05-10: qty 2

## 2015-05-10 MED ORDER — OXYCODONE HCL 5 MG PO TABS
10.0000 mg | ORAL_TABLET | Freq: Once | ORAL | Status: AC
  Administered 2015-05-10: 10 mg via ORAL
  Filled 2015-05-10: qty 2

## 2015-05-10 NOTE — Discharge Instructions (Signed)
°  Return to the emergency room in 48 hours for wound check  Abscess An abscess is an infected area that contains a collection of pus and debris.It can occur in almost any part of the body. An abscess is also known as a furuncle or boil. CAUSES  An abscess occurs when tissue gets infected. This can occur from blockage of oil or sweat glands, infection of hair follicles, or a minor injury to the skin. As the body tries to fight the infection, pus collects in the area and creates pressure under the skin. This pressure causes pain. People with weakened immune systems have difficulty fighting infections and get certain abscesses more often.  SYMPTOMS Usually an abscess develops on the skin and becomes a painful mass that is red, warm, and tender. If the abscess forms under the skin, you may feel a moveable soft area under the skin. Some abscesses break open (rupture) on their own, but most will continue to get worse without care. The infection can spread deeper into the body and eventually into the bloodstream, causing you to feel ill.  DIAGNOSIS  Your caregiver will take your medical history and perform a physical exam. A sample of fluid may also be taken from the abscess to determine what is causing your infection. TREATMENT  Your caregiver may prescribe antibiotic medicines to fight the infection. However, taking antibiotics alone usually does not cure an abscess. Your caregiver may need to make a small cut (incision) in the abscess to drain the pus. In some cases, gauze is packed into the abscess to reduce pain and to continue draining the area. HOME CARE INSTRUCTIONS   Only take over-the-counter or prescription medicines for pain, discomfort, or fever as directed by your caregiver.  If you were prescribed antibiotics, take them as directed. Finish them even if you start to feel better.  If gauze is used, follow your caregiver's directions for changing the gauze.  To avoid spreading the  infection:  Keep your draining abscess covered with a bandage.  Wash your hands well.  Do not share personal care items, towels, or whirlpools with others.  Avoid skin contact with others.  Keep your skin and clothes clean around the abscess.  Keep all follow-up appointments as directed by your caregiver. SEEK MEDICAL CARE IF:   You have increased pain, swelling, redness, fluid drainage, or bleeding.  You have muscle aches, chills, or a general ill feeling.  You have a fever. MAKE SURE YOU:   Understand these instructions.  Will watch your condition.  Will get help right away if you are not doing well or get worse. Document Released: 09/11/2005 Document Revised: 06/02/2012 Document Reviewed: 02/14/2012 [facility] Patient Information 2015 [place]. This information is not intended to replace advice given to you by your health care provider. Make sure you discuss any questions you have with your health care provider.

## 2015-05-10 NOTE — ED Notes (Signed)
MD at bedside to drain abscess.

## 2015-05-10 NOTE — ED Notes (Signed)
Patient presents to ED with c/o 2 abcesses to right arm.  Patient has abscess to left elbow and left lateral forearm.  Patient states she has been taking left over antibiotics.

## 2015-05-10 NOTE — ED Provider Notes (Signed)
CSN: [id number]     Arrival date & time 05/10/15  2104 History   First MD Initiated Contact with Patient 05/10/15 2120     Chief Complaint  Patient presents with  . Abscess      HPI  She presents for evaluation of 2 areas of infection on her right forearm. States she missed on injecting heroin into a vein  few days ago. Noticed redness 2 days ago. Has been getting "some infection" when she squeezes on for the last few days.  No fevers no chills. Normal use of the arm only limited by pain no numbness weakness.  Past Medical History  Diagnosis Date  . Chronic shoulder pain   . Anxiety   . Hepatitis C   . Polysubstance abuse    Past Surgical History  Procedure Laterality Date  . Ankle surgery     Family History  Problem Relation Age of Onset  . Cirrhosis Father   . Hepatitis C Father    History  Substance Use Topics  . Smoking status: Current Every Day Smoker -- 1.00 packs/day    Types: Cigarettes  . Smokeless tobacco: Not on file  . Alcohol Use: Yes   OB History    No data available     Review of Systems  Constitutional: Negative for fever, chills, diaphoresis, appetite change and fatigue.  HENT: Negative for mouth sores, sore throat and trouble swallowing.   Eyes: Negative for visual disturbance.  Respiratory: Negative for cough, chest tightness, shortness of breath and wheezing.   Cardiovascular: Negative for chest pain.  Gastrointestinal: Negative for nausea, vomiting, abdominal pain, diarrhea and abdominal distention.  Endocrine: Negative for polydipsia, polyphagia and polyuria.  Genitourinary: Negative for dysuria, frequency and hematuria.  Musculoskeletal: Negative for gait problem.       Redness pain and swelling to 2 areas of the right forearm, and antecubital fossa.  Skin: Negative for color change, pallor and rash.  Neurological: Negative for dizziness, syncope, light-headedness and headaches.  Hematological: Does not bruise/bleed easily.    Psychiatric/Behavioral: Negative for behavioral problems and confusion.      Allergies  Penicillins; Tylenol; Venlafaxine; and Morphine and related  Home Medications   Prior to Admission medications   Medication Sig Start Date End Date Taking? Authorizing Provider  clonazePAM (KLONOPIN) 1 MG tablet Take 1 mg by mouth 3 (three) times daily as needed. For anxiety    Historical Provider, MD  cloNIDine (CATAPRES) 0.2 MG tablet Take 1 tablet (0.2 mg total) by mouth 2 (two) times daily. Patient not taking: Reported on 10/31/2014 09/14/14   Gerhard Munch, MD  dicyclomine (BENTYL) 20 MG tablet Take 1 tablet (20 mg total) by mouth every 6 (six) hours as needed for spasms (abdominal cramping). 02/01/15   Samuel Jester, DO  ibuprofen (ADVIL,MOTRIN) 800 MG tablet Take 1 tablet (800 mg total) by mouth every 8 (eight) hours as needed for mild pain. Patient not taking: Reported on 01/11/2015 11/02/14   Kristen N Ward, DO  oxycodone (OXY-IR) 5 MG capsule Take 1 capsule (5 mg total) by mouth every 6 (six) hours as needed. Patient not taking: Reported on 02/01/2015 01/12/15   Ivery Quale, PA-C  pantoprazole (PROTONIX) 40 MG tablet Take 1 tablet (40 mg total) by mouth daily. Patient not taking: Reported on 10/31/2014 06/07/14   Nita Sells Mikhail, DO  promethazine (PHENERGAN) 25 MG tablet Take 1 tablet (25 mg total) by mouth every 6 (six) hours as needed for nausea or vomiting. 02/01/15   Samuel Jester,  DO  sulfamethoxazole-trimethoprim (BACTRIM DS,SEPTRA DS) 800-160 MG per tablet Take 2 tablets by mouth 2 (two) times daily. 05/10/15   Rolland PorterMark Paytience Bures, MD  traMADol (ULTRAM) 50 MG tablet Take 1 tablet (50 mg total) by mouth every 6 (six) hours as needed. 05/10/15   Rolland PorterMark Yosselin Zoeller, MD   BP 113/72 mmHg  Pulse 119  Temp(Src) 99.3 F (37.4 C) (Oral)  Resp 18  Ht 5\' 7"  (1.702 m)  Wt 135 lb (61.236 kg)  BMI 21.14 kg/m2  SpO2 98% Physical Exam  Constitutional: She is oriented to person, place, and time. She appears  well-developed and well-nourished. No distress.  HENT:  Head: Normocephalic.  Eyes: Conjunctivae are normal. Pupils are equal, round, and reactive to light. No scleral icterus.  Neck: Normal range of motion. Neck supple. No thyromegaly present.  Cardiovascular: Normal rate and regular rhythm.  Exam reveals no gallop and no friction rub.   No murmur heard. Not tachycardic at the bedside. Heart rate 94.. No fever. No heart murmur.  Pulmonary/Chest: Effort normal and breath sounds normal. No respiratory distress. She has no wheezes. She has no rales.  Abdominal: Soft. Bowel sounds are normal. She exhibits no distension. There is no tenderness. There is no rebound.  Musculoskeletal: Normal range of motion.       Arms: Neurological: She is alert and oriented to person, place, and time.  Skin: Skin is warm and dry. No rash noted.  Psychiatric: She has a normal mood and affect. Her behavior is normal.    ED Course  Procedures (including critical care time) Labs Review Labs Reviewed - No data to display  Imaging Review No results found.   EKG Interpretation None      MDM   Final diagnoses:  Abscess    INCISION AND DRAINAGE Performed by: Claudean KindsJAMES, Hermila Millis JOSEPH Consent: Verbal consent obtained. Risks and benefits: risks, benefits and alternatives were discussed Type: abscess  Body area: AC Fossa right arm  Anesthesia: local infiltration  Incision was made with a scalpel.  Local anesthetic: lidocaine 1% with epinephrine  Anesthetic total: 4 ml  Complexity: complex Blunt dissection to break up loculations  Drainage: purulent  Drainage amount: moderate  Packing material: 1/4 in iodoform gauze  Patient tolerance: Patient tolerated the procedure well with no immediate complications.     INCISION AND DRAINAGE Performed by: Claudean KindsJAMES, Maebry Obrien JOSEPH Consent: Verbal consent obtained. Risks and benefits: risks, benefits and alternatives were discussed Type: abscess  Body  area: Distal right Forearm  Anesthesia: local infiltration  Incision was made with a scalpel.  Local anesthetic: lidocaine 1% with epinephrine  Anesthetic total: 4 ml  Complexity: complex Blunt dissection to break up loculations  Drainage: purulent  Drainage amount: moderate  Packing material: 1/4 in iodoform gauze  Patient tolerance: Patient tolerated the procedure well with no immediate complications.       Rolland PorterMark Camry Robello, MD 05/10/15 [phone]

## 2015-07-25 ENCOUNTER — Emergency Department (HOSPITAL_COMMUNITY)
Admission: EM | Admit: 2015-07-25 | Discharge: 2015-07-26 | Disposition: A | Discharge status: Home / Self Care | Payer: Self-pay | Attending: Emergency Medicine | Admitting: Emergency Medicine

## 2015-07-25 ENCOUNTER — Encounter (HOSPITAL_COMMUNITY): Payer: Self-pay | Admitting: *Deleted

## 2015-07-25 DIAGNOSIS — R197 Diarrhea, unspecified: Secondary | ICD-10-CM | POA: Insufficient documentation

## 2015-07-25 DIAGNOSIS — F1310 Sedative, hypnotic or anxiolytic abuse, uncomplicated: Secondary | ICD-10-CM | POA: Insufficient documentation

## 2015-07-25 DIAGNOSIS — Z792 Long term (current) use of antibiotics: Secondary | ICD-10-CM | POA: Insufficient documentation

## 2015-07-25 DIAGNOSIS — Z720 Tobacco use: Secondary | ICD-10-CM | POA: Insufficient documentation

## 2015-07-25 DIAGNOSIS — R21 Rash and other nonspecific skin eruption: Secondary | ICD-10-CM | POA: Insufficient documentation

## 2015-07-25 DIAGNOSIS — F419 Anxiety disorder, unspecified: Secondary | ICD-10-CM | POA: Insufficient documentation

## 2015-07-25 DIAGNOSIS — R110 Nausea: Secondary | ICD-10-CM | POA: Insufficient documentation

## 2015-07-25 DIAGNOSIS — R531 Weakness: Secondary | ICD-10-CM | POA: Insufficient documentation

## 2015-07-25 DIAGNOSIS — R1011 Right upper quadrant pain: Secondary | ICD-10-CM | POA: Insufficient documentation

## 2015-07-25 DIAGNOSIS — Z880 Allergy status to penicillin: Secondary | ICD-10-CM | POA: Insufficient documentation

## 2015-07-25 DIAGNOSIS — M791 Myalgia: Secondary | ICD-10-CM | POA: Insufficient documentation

## 2015-07-25 DIAGNOSIS — B850 Pediculosis due to Pediculus humanus capitis: Secondary | ICD-10-CM | POA: Insufficient documentation

## 2015-07-25 DIAGNOSIS — M7918 Myalgia, other site: Secondary | ICD-10-CM

## 2015-07-25 DIAGNOSIS — G8929 Other chronic pain: Secondary | ICD-10-CM | POA: Insufficient documentation

## 2015-07-25 NOTE — ED Provider Notes (Signed)
CSN: [id number]     Arrival date & time 07/25/15  2147 History  This chart was scribed for Devoria Albe, MD by Budd Palmer, ED Scribe. This patient was seen in room APA08/APA08 and the patient's care was started at 12:06 AM.    Chief Complaint  Patient presents with  . Abdominal Pain   The history is provided by the patient. No language interpreter was used.   HPI Comments: Heidi [name] is a 29 y.o. female smoker at 1 ppd with a PMHx of Hepatitis C, who presents to the Emergency Department complaining of RUQ abdominal pain onset 1 day ago. She reports associated nausea,, diarrhea (3x, loose, brown), and weakness. She states that it feels like her hepatitis flare ups. She does not have a PCP. She drinks occasionally. She has taken a benzo  today for anxiety. She has last used street drugs 4 months ago. Pt denies vomiting and fever.  She also c/o a red, itchy rash to the arms and legs onset 4 days ago. She states it has lessened some since onset. She denies any other house mates having similar Sx.   She also c/o white "super" lice on her scalp. She states it started 2 weeks ago. She has used Rid and [employer] lice treatments 1 day ago and 2 weeks ago with no relief.   PCP none  Past Medical History  Diagnosis Date  . Chronic shoulder pain   . Anxiety   . Hepatitis C   . Polysubstance abuse    Past Surgical History  Procedure Laterality Date  . Ankle surgery     Family History  Problem Relation Age of Onset  . Cirrhosis Father   . Hepatitis C Father    Social History  Substance Use Topics  . Smoking status: Current Every Day Smoker -- 1.00 packs/day    Types: Cigarettes  . Smokeless tobacco: None  . Alcohol Use: Yes   unemployed  OB History    No data available     Review of Systems  Constitutional: Negative for fever.  Gastrointestinal: Positive for nausea, abdominal pain and diarrhea. Negative for vomiting.  Skin: Positive for rash.  Neurological: Positive  for weakness.  All other systems reviewed and are negative.   Allergies  Penicillins; Tylenol; Venlafaxine; and Morphine and related  Home Medications   Prior to Admission medications   Medication Sig Start Date End Date Taking? Authorizing Provider  Benzyl Alcohol (ULESFIA) 5 % LOTN Apply to hairy areas and leave on for 10 minutes then rinse. 07/26/15   Devoria Albe, MD  clonazePAM (KLONOPIN) 1 MG tablet Take 1 mg by mouth 3 (three) times daily as needed. For anxiety    Historical Provider, MD  cloNIDine (CATAPRES) 0.2 MG tablet Take 1 tablet (0.2 mg total) by mouth 2 (two) times daily. Patient not taking: Reported on 10/31/2014 09/14/14   Gerhard Munch, MD  cyclobenzaprine (FLEXERIL) 10 MG tablet Take 1 tablet (10 mg total) by mouth 3 (three) times daily as needed (muscle soreness). 07/26/15   Devoria Albe, MD  dicyclomine (BENTYL) 20 MG tablet Take 1 tablet (20 mg total) by mouth every 6 (six) hours as needed for spasms (abdominal cramping). 02/01/15   Samuel Jester, DO  ibuprofen (ADVIL,MOTRIN) 800 MG tablet Take 1 tablet (800 mg total) by mouth every 8 (eight) hours as needed for mild pain. Patient not taking: Reported on 01/11/2015 11/02/14   Kristen N Ward, DO  naproxen (NAPROSYN) 500 MG tablet Take 1  po BID with food prn pain 07/26/15   Devoria Albe, MD  oxycodone (OXY-IR) 5 MG capsule Take 1 capsule (5 mg total) by mouth every 6 (six) hours as needed. Patient not taking: Reported on 02/01/2015 01/12/15   Ivery Quale, PA-C  pantoprazole (PROTONIX) 40 MG tablet Take 1 tablet (40 mg total) by mouth daily. Patient not taking: Reported on 10/31/2014 06/07/14   Nita Sells Mikhail, DO  promethazine (PHENERGAN) 25 MG tablet Take 1 tablet (25 mg total) by mouth every 6 (six) hours as needed for nausea or vomiting. 02/01/15   Samuel Jester, DO  sulfamethoxazole-trimethoprim (BACTRIM DS,SEPTRA DS) 800-160 MG per tablet Take 2 tablets by mouth 2 (two) times daily. 05/10/15   Rolland Porter, MD   sulfamethoxazole-trimethoprim (BACTRIM DS,SEPTRA DS) 800-160 MG per tablet Take 2 tablets by mouth 2 (two) times daily. 05/10/15   Rolland Porter, MD  traMADol (ULTRAM) 50 MG tablet Take 1 tablet (50 mg total) by mouth every 6 (six) hours as needed. 05/10/15   Rolland Porter, MD  traMADol (ULTRAM) 50 MG tablet Take 1 tablet (50 mg total) by mouth every 6 (six) hours as needed for moderate pain. 05/10/15   Rolland Porter, MD   BP 113/72 mmHg  Pulse 102  Temp(Src) 98.7 F (37.1 C) (Oral)  Resp 16  Ht 5\' 7"  (1.702 m)  Wt 120 lb (54.432 kg)  BMI 18.79 kg/m2  SpO2 99%  Vital signs normal except for tachycardia  Physical Exam  Constitutional: She is oriented to person, place, and time. She appears well-developed and well-nourished.  Non-toxic appearance. She does not appear ill. No distress.  HENT:  Head: Normocephalic and atraumatic.  Right Ear: External ear normal.  Left Ear: External ear normal.  Nose: Nose normal. No mucosal edema or rhinorrhea.  Mouth/Throat: Oropharynx is clear and moist and mucous membranes are normal. No dental abscesses or uvula swelling.  Patients here briefly inspected and there are some small white things seen in her hair  Eyes: Conjunctivae and EOM are normal. Pupils are equal, round, and reactive to light.  Neck: Normal range of motion and full passive range of motion without pain. Neck supple.  Cardiovascular: Normal rate, regular rhythm and normal heart sounds.  Exam reveals no gallop and no friction rub.   No murmur heard. Pulmonary/Chest: Effort normal and breath sounds normal. No respiratory distress. She has no wheezes. She has no rhonchi. She has no rales. She exhibits no tenderness and no crepitus.  Abdominal: Soft. Normal appearance and bowel sounds are normal. She exhibits no distension. There is tenderness. There is no rebound and no guarding.    Musculoskeletal: Normal range of motion. She exhibits tenderness. She exhibits no edema.       Back:  Moves all  extremities well. Bilateral CVA TTP.   Neurological: She is alert and oriented to person, place, and time. She has normal strength. No cranial nerve deficit.  Skin: Skin is warm, dry and intact. Rash noted. No erythema. No pallor.  No lesions on her palms or on the webbed spaces between her fingers. She has scattered, excoriated lesions on her arms.   Psychiatric: She has a normal mood and affect. Her speech is normal and behavior is normal. Her mood appears not anxious.  Nursing note and vitals reviewed.   ED Course  Procedures   Medications  diphenhydrAMINE (BENADRYL) injection 50 mg (50 mg Intramuscular Given 07/26/15 0130)  ketorolac (TORADOL) 30 MG/ML injection 60 mg (60 mg Intramuscular Given 07/26/15 0428)  DIAGNOSTIC STUDIES: Oxygen Saturation is 99% on RA, normal by my interpretation.    COORDINATION OF CARE: 12:14 AM - Discussed plans to order diagnostic studies. Pt advised of plan for treatment and pt agrees. Patient was given Dramamine for her itching. I initially did not give her a thing for pain and then gave her Toradol. At time of discharge patient was sleeping in no distress. She was given her test results.  Labs Review Results for orders placed or performed during the hospital encounter of 07/25/15  Comprehensive metabolic panel  Result Value Ref Range   Sodium 138 135 - 145 mmol/L   Potassium 3.6 3.5 - 5.1 mmol/L   Chloride 106 101 - 111 mmol/L   CO2 26 22 - 32 mmol/L   Glucose, Bld 104 (H) 65 - 99 mg/dL   BUN 17 6 - 20 mg/dL   Creatinine, Ser 1.61 0.44 - 1.00 mg/dL   Calcium 8.9 8.9 - 09.6 mg/dL   Total Protein 7.8 6.5 - 8.1 g/dL   Albumin 3.8 3.5 - 5.0 g/dL   AST 15 15 - 41 U/L   ALT 18 14 - 54 U/L   Alkaline Phosphatase 57 38 - 126 U/L   Total Bilirubin 0.3 0.3 - 1.2 mg/dL   GFR calc non Af Amer >60 >60 mL/min   GFR calc Af Amer >60 >60 mL/min   Anion gap 6 5 - 15  CBC with Differential  Result Value Ref Range   WBC 9.7 4.0 - 10.5 K/uL   RBC 5.13  (H) 3.87 - 5.11 MIL/uL   Hemoglobin 13.9 12.0 - 15.0 g/dL   HCT 04.5 40.9 - 81.1 %   MCV 82.5 78.0 - 100.0 fL   MCH 27.1 26.0 - 34.0 pg   MCHC 32.9 30.0 - 36.0 g/dL   RDW 91.4 78.2 - 95.6 %   Platelets 257 150 - 400 K/uL   Neutrophils Relative % 61 43 - 77 %   Neutro Abs 6.0 1.7 - 7.7 K/uL   Lymphocytes Relative 30 12 - 46 %   Lymphs Abs 2.9 0.7 - 4.0 K/uL   Monocytes Relative 7 3 - 12 %   Monocytes Absolute 0.6 0.1 - 1.0 K/uL   Eosinophils Relative 2 0 - 5 %   Eosinophils Absolute 0.2 0.0 - 0.7 K/uL   Basophils Relative 0 0 - 1 %   Basophils Absolute 0.0 0.0 - 0.1 K/uL  Urinalysis, Routine w reflex microscopic (not at [facility])  Result Value Ref Range   Color, Urine YELLOW YELLOW   APPearance CLEAR CLEAR   Specific Gravity, Urine 1.025 1.005 - 1.030   pH 6.0 5.0 - 8.0   Glucose, UA NEGATIVE NEGATIVE mg/dL   Hgb urine dipstick TRACE (A) NEGATIVE   Bilirubin Urine NEGATIVE NEGATIVE   Ketones, ur NEGATIVE NEGATIVE mg/dL   Protein, ur NEGATIVE NEGATIVE mg/dL   Urobilinogen, UA 0.2 0.0 - 1.0 mg/dL   Nitrite NEGATIVE NEGATIVE   Leukocytes, UA SMALL (A) NEGATIVE  Urine rapid drug screen (hosp performed)  Result Value Ref Range   Opiates NEGATIVE (A) NONE DETECTED   Cocaine NEGATIVE (A) NONE DETECTED   Benzodiazepines POSITIVE (A) NONE DETECTED   Amphetamines NEGATIVE (A) NONE DETECTED   Tetrahydrocannabinol NEGATIVE (A) NONE DETECTED   Barbiturates NEGATIVE (A) NONE DETECTED  Lipase, blood  Result Value Ref Range   Lipase 25 22 - 51 U/L  Urine microscopic-add on  Result Value Ref Range   Squamous Epithelial / LPF FEW (  A) RARE   WBC, UA 11-20 <3 WBC/hpf   RBC / HPF 3-6 <3 RBC/hpf   Bacteria, UA FEW (A) RARE   Urine-Other TRICHOMONAS PRESENT    Laboratory interpretation all normal including her liver function tests which is an improvement from prior labs.   Imaging Review No results found.   EKG Interpretation None      MDM   Final diagnoses:  RUQ pain   Musculoskeletal pain  Head lice infestation    Discharge Medication List as of 07/26/2015  3:45 AM    START taking these medications   Details  Benzyl Alcohol (ULESFIA) 5 % LOTN Apply to hairy areas and leave on for 10 minutes then rinse., Print    cyclobenzaprine (FLEXERIL) 10 MG tablet Take 1 tablet (10 mg total) by mouth 3 (three) times daily as needed (muscle soreness)., Starting 07/26/2015, Until Discontinued, Print    naproxen (NAPROSYN) 500 MG tablet Take 1 po BID with food prn pain, Print       OTC Benadryl for itching as needed  Plan discharge  Devoria Albe, MD, Concha Pyo, MD 07/26/15 [phone]

## 2015-07-25 NOTE — ED Notes (Signed)
Pt c/o abdominal pain, back pain, and rash. Then pt states as she is scratching her head that she has super lice. Pt states she has treated it twice and can't get rid of it.

## 2015-07-26 LAB — COMPREHENSIVE METABOLIC PANEL
ALBUMIN: 3.8 g/dL (ref 3.5–5.0)
ALK PHOS: 57 U/L (ref 38–126)
ALT: 18 U/L (ref 14–54)
AST: 15 U/L (ref 15–41)
Anion gap: 6 (ref 5–15)
BUN: 17 mg/dL (ref 6–20)
CO2: 26 mmol/L (ref 22–32)
Calcium: 8.9 mg/dL (ref 8.9–10.3)
Chloride: 106 mmol/L (ref 101–111)
Creatinine, Ser: 0.65 mg/dL (ref 0.44–1.00)
Glucose, Bld: 104 mg/dL — ABNORMAL HIGH (ref 65–99)
Potassium: 3.6 mmol/L (ref 3.5–5.1)
Sodium: 138 mmol/L (ref 135–145)
TOTAL PROTEIN: 7.8 g/dL (ref 6.5–8.1)
Total Bilirubin: 0.3 mg/dL (ref 0.3–1.2)

## 2015-07-26 LAB — CBC WITH DIFFERENTIAL/PLATELET
BASOS ABS: 0 10*3/uL (ref 0.0–0.1)
Basophils Relative: 0 % (ref 0–1)
Eosinophils Absolute: 0.2 10*3/uL (ref 0.0–0.7)
Eosinophils Relative: 2 % (ref 0–5)
HCT: 42.3 % (ref 36.0–46.0)
Hemoglobin: 13.9 g/dL (ref 12.0–15.0)
LYMPHS PCT: 30 % (ref 12–46)
Lymphs Abs: 2.9 10*3/uL (ref 0.7–4.0)
MCH: 27.1 pg (ref 26.0–34.0)
MCHC: 32.9 g/dL (ref 30.0–36.0)
MCV: 82.5 fL (ref 78.0–100.0)
Monocytes Absolute: 0.6 10*3/uL (ref 0.1–1.0)
Monocytes Relative: 7 % (ref 3–12)
Neutro Abs: 6 10*3/uL (ref 1.7–7.7)
Neutrophils Relative %: 61 % (ref 43–77)
Platelets: 257 10*3/uL (ref 150–400)
RBC: 5.13 MIL/uL — ABNORMAL HIGH (ref 3.87–5.11)
RDW: 13.2 % (ref 11.5–15.5)
WBC: 9.7 10*3/uL (ref 4.0–10.5)

## 2015-07-26 LAB — URINALYSIS, ROUTINE W REFLEX MICROSCOPIC
BILIRUBIN URINE: NEGATIVE
Glucose, UA: NEGATIVE mg/dL
Ketones, ur: NEGATIVE mg/dL
Nitrite: NEGATIVE
Protein, ur: NEGATIVE mg/dL
SPECIFIC GRAVITY, URINE: 1.025 (ref 1.005–1.030)
Urobilinogen, UA: 0.2 mg/dL (ref 0.0–1.0)
pH: 6 (ref 5.0–8.0)

## 2015-07-26 LAB — LIPASE, BLOOD: Lipase: 25 U/L (ref 22–51)

## 2015-07-26 LAB — RAPID URINE DRUG SCREEN, HOSP PERFORMED
Amphetamines: NEGATIVE — AB
BARBITURATES: NEGATIVE — AB
BENZODIAZEPINES: POSITIVE — AB
Cocaine: NEGATIVE — AB
Opiates: NEGATIVE — AB
Tetrahydrocannabinol: NEGATIVE — AB

## 2015-07-26 LAB — URINE MICROSCOPIC-ADD ON

## 2015-07-26 MED ORDER — KETOROLAC TROMETHAMINE 30 MG/ML IJ SOLN
60.0000 mg | Freq: Once | INTRAMUSCULAR | Status: AC
  Administered 2015-07-26: 60 mg via INTRAMUSCULAR
  Filled 2015-07-26: qty 2

## 2015-07-26 MED ORDER — DIPHENHYDRAMINE HCL 50 MG/ML IJ SOLN
50.0000 mg | Freq: Once | INTRAMUSCULAR | Status: AC
  Administered 2015-07-26: 50 mg via INTRAMUSCULAR
  Filled 2015-07-26: qty 1

## 2015-07-26 MED ORDER — NAPROXEN 500 MG PO TABS: ORAL_TABLET | ORAL | Status: DC

## 2015-07-26 MED ORDER — CYCLOBENZAPRINE HCL 10 MG PO TABS: 10.0000 mg | ORAL_TABLET | Freq: Three times a day (TID) | ORAL | Status: DC | PRN

## 2015-07-26 MED ORDER — BENZYL ALCOHOL 5 % EX LOTN: TOPICAL_LOTION | CUTANEOUS | Status: DC

## 2015-07-26 NOTE — ED Notes (Signed)
Pt sleeping. 

## 2015-07-26 NOTE — Discharge Instructions (Signed)
Use the ulesfia on your hairy area of your body and leave on for 10 minutes then rinse. Take the other prescriptions for your pain. Your liver tests tonight are normal. You need to get a primary care doctor, you can try the [facility] Department.  Head and Pubic Lice Lice are tiny, light brown insects with claws on the ends of their legs. They are small parasites that live on the human body. Lice often make their home in your hair. They hatch from little round eggs (nits), which are attached to the base of hairs. They spread by:  Direct contact with an infested person.  Infested personal items such as combs, brushes, towels, clothing, pillow cases and sheets. The parasite that causes your condition may also live in clothes which have been worn within the week before treatment. Therefore, it is necessary to wash your clothes, bed linens, towels, combs and brushes. Any woolens can be put in an air-tight plastic bag for one week. You need to use fresh clothes, towels and sheets after your treatment is completed. Re-treatment is usually not necessary if instructions are followed. If necessary, treatment may be repeated in 7 days. The entire family may require treatment. Sexual partners should be treated if the nits are present in the pubic area. TREATMENT  Apply enough medicated shampoo or cream to wet hair and skin in and around the infected areas.  Work thoroughly into hair and leave in according to instructions.  Add a small amount of water until a good lather forms.  Rinse thoroughly.  Towel briskly.  When hair is dry, any remaining nits, cream or shampoo may be removed with a fine-tooth comb or tweezers. The nits resemble dandruff; however they are glued to the hair follicle and are difficult to brush out. Frequent fine combing and shampoos are necessary. A towel soaked in white vinegar and left on the hair for 2 hours will also help soften the glue which holds the nits on the  hair. Medicated shampoo or cream should not be used on children or pregnant women without a caregiver's prescription or instructions. SEEK MEDICAL CARE IF:   You or your child develops sores that look infected.  The rash does not go away in one week.  The lice or nits return or persist in spite of treatment. Document Released: 12/02/2005 Document Revised: 02/24/2012 Document Reviewed: 07/01/2007 [facility] Patient Information 2015 [place], [place]. This information is not intended to replace advice given to you by your health care provider. Make sure you discuss any questions you have with your health care provider.

## 2015-07-26 NOTE — ED Notes (Signed)
Patient has eaten peanut and graham crackers with Mt Dew. Patient resting on stretcher with eyes closed.

## 2015-10-12 ENCOUNTER — Emergency Department (HOSPITAL_COMMUNITY)
Admission: EM | Admit: 2015-10-12 | Discharge: 2015-10-12 | Disposition: A | Discharge status: Home / Self Care | Payer: Self-pay | Attending: Emergency Medicine | Admitting: Emergency Medicine

## 2015-10-12 ENCOUNTER — Encounter (HOSPITAL_COMMUNITY): Payer: Self-pay | Admitting: *Deleted

## 2015-10-12 DIAGNOSIS — Z720 Tobacco use: Secondary | ICD-10-CM | POA: Insufficient documentation

## 2015-10-12 DIAGNOSIS — M25511 Pain in right shoulder: Secondary | ICD-10-CM | POA: Insufficient documentation

## 2015-10-12 DIAGNOSIS — L0201 Cutaneous abscess of face: Secondary | ICD-10-CM | POA: Insufficient documentation

## 2015-10-12 DIAGNOSIS — Z880 Allergy status to penicillin: Secondary | ICD-10-CM | POA: Insufficient documentation

## 2015-10-12 DIAGNOSIS — G8929 Other chronic pain: Secondary | ICD-10-CM | POA: Insufficient documentation

## 2015-10-12 DIAGNOSIS — F419 Anxiety disorder, unspecified: Secondary | ICD-10-CM | POA: Insufficient documentation

## 2015-10-12 DIAGNOSIS — Z8619 Personal history of other infectious and parasitic diseases: Secondary | ICD-10-CM | POA: Insufficient documentation

## 2015-10-12 MED ORDER — CIPROFLOXACIN HCL 250 MG PO TABS
500.0000 mg | ORAL_TABLET | Freq: Once | ORAL | Status: AC
  Administered 2015-10-12: 500 mg via ORAL
  Filled 2015-10-12: qty 2

## 2015-10-12 MED ORDER — SULFAMETHOXAZOLE-TRIMETHOPRIM 800-160 MG PO TABS: 1.0000 | ORAL_TABLET | Freq: Two times a day (BID) | ORAL | Status: AC

## 2015-10-12 MED ORDER — PROMETHAZINE HCL 12.5 MG PO TABS
12.5000 mg | ORAL_TABLET | Freq: Once | ORAL | Status: AC
  Administered 2015-10-12: 12.5 mg via ORAL
  Filled 2015-10-12: qty 1

## 2015-10-12 MED ORDER — TRAMADOL HCL 50 MG PO TABS: ORAL_TABLET | ORAL | Status: DC

## 2015-10-12 MED ORDER — MELOXICAM 7.5 MG PO TABS: 7.5000 mg | ORAL_TABLET | Freq: Every day | ORAL | Status: DC

## 2015-10-12 MED ORDER — DOXYCYCLINE HYCLATE 100 MG PO TABS
100.0000 mg | ORAL_TABLET | Freq: Once | ORAL | Status: AC
  Administered 2015-10-12: 100 mg via ORAL
  Filled 2015-10-12: qty 1

## 2015-10-12 MED ORDER — KETOROLAC TROMETHAMINE 10 MG PO TABS
10.0000 mg | ORAL_TABLET | Freq: Once | ORAL | Status: AC
  Administered 2015-10-12: 10 mg via ORAL
  Filled 2015-10-12: qty 1

## 2015-10-12 NOTE — Discharge Instructions (Signed)
Please apply warm compresses to the nose 3-4 times daily. Please use Bactrim and mobic 2 times daily with food. May use Ultram for pain if needed. This medication may cause drowsiness, please use with caution. Please see Dr. Lovell SheehanJenkins (surgery) for evaluation if not improving in for 5 days. Abscess An abscess is an infected area that contains a collection of pus and debris.It can occur in almost any part of the body. An abscess is also known as a furuncle or boil. CAUSES  An abscess occurs when tissue gets infected. This can occur from blockage of oil or sweat glands, infection of hair follicles, or a minor injury to the skin. As the body tries to fight the infection, pus collects in the area and creates pressure under the skin. This pressure causes pain. People with weakened immune systems have difficulty fighting infections and get certain abscesses more often.  SYMPTOMS Usually an abscess develops on the skin and becomes a painful mass that is red, warm, and tender. If the abscess forms under the skin, you may feel a moveable soft area under the skin. Some abscesses break open (rupture) on their own, but most will continue to get worse without care. The infection can spread deeper into the body and eventually into the bloodstream, causing you to feel ill.  DIAGNOSIS  Your caregiver will take your medical history and perform a physical exam. A sample of fluid may also be taken from the abscess to determine what is causing your infection. TREATMENT  Your caregiver may prescribe antibiotic medicines to fight the infection. However, taking antibiotics alone usually does not cure an abscess. Your caregiver may need to make a small cut (incision) in the abscess to drain the pus. In some cases, gauze is packed into the abscess to reduce pain and to continue draining the area. HOME CARE INSTRUCTIONS   Only take over-the-counter or prescription medicines for pain, discomfort, or fever as directed by your  caregiver.  If you were prescribed antibiotics, take them as directed. Finish them even if you start to feel better.  If gauze is used, follow your caregiver's directions for changing the gauze.  To avoid spreading the infection:  Keep your draining abscess covered with a bandage.  Wash your hands well.  Do not share personal care items, towels, or whirlpools with others.  Avoid skin contact with others.  Keep your skin and clothes clean around the abscess.  Keep all follow-up appointments as directed by your caregiver. SEEK MEDICAL CARE IF:   You have increased pain, swelling, redness, fluid drainage, or bleeding.  You have muscle aches, chills, or a general ill feeling.  You have a fever. MAKE SURE YOU:   Understand these instructions.  Will watch your condition.  Will get help right away if you are not doing well or get worse.   This information is not intended to replace advice given to you by your health care provider. Make sure you discuss any questions you have with your health care provider.   Document Released: 09/11/2005 Document Revised: 06/02/2012 Document Reviewed: 02/14/2012 Elsevier Interactive Patient Education [employer]2016 [employer]

## 2015-10-12 NOTE — ED Notes (Addendum)
Pt c/o abscess to nose that started a few days ago, pt also c/o pain to right shoulder, denies any injury, states that she has had this pain since high school

## 2015-10-12 NOTE — ED Provider Notes (Signed)
CSN: [id number]     Arrival date & time 10/12/15  2132 History   None    Chief Complaint  Patient presents with  . Abscess     (Consider location/radiation/quality/duration/timing/severity/associated sxs/prior Treatment) Patient is a 29 y.o. female presenting with abscess.  Abscess Location:  Face Facial abscess location:  Nose Abscess quality: painful and redness   Abscess quality: not draining   Red streaking: no   Duration:  3 days Progression:  Worsening Pain details:    Quality:  Aching   Severity:  Moderate   Timing:  Intermittent   Progression:  Worsening Chronicity:  New Context: not diabetes   Context comment:  Popped a pimple on the nose Relieved by:  Nothing Exacerbated by: palpation. Associated symptoms: no fever and no nausea   Risk factors: no hx of MRSA     Past Medical History  Diagnosis Date  . Chronic shoulder pain   . Anxiety   . Hepatitis C   . Polysubstance abuse    Past Surgical History  Procedure Laterality Date  . Ankle surgery     Family History  Problem Relation Age of Onset  . Cirrhosis Father   . Hepatitis C Father    Social History  Substance Use Topics  . Smoking status: Current Every Day Smoker -- 1.00 packs/day    Types: Cigarettes  . Smokeless tobacco: None  . Alcohol Use: Yes     Comment: denies   OB History    No data available     Review of Systems  Constitutional: Negative for fever.  Gastrointestinal: Negative for nausea.  Musculoskeletal: Positive for arthralgias.  Psychiatric/Behavioral: The patient is nervous/anxious.   All other systems reviewed and are negative.     Allergies  Penicillins; Tylenol; Venlafaxine; and Morphine and related  Home Medications   Prior to Admission medications   Medication Sig Start Date End Date Taking? Authorizing Provider  Benzyl Alcohol (ULESFIA) 5 % LOTN Apply to hairy areas and leave on for 10 minutes then rinse. Patient not taking: Reported on 10/12/2015 07/26/15    Devoria Albe, MD  cloNIDine (CATAPRES) 0.2 MG tablet Take 1 tablet (0.2 mg total) by mouth 2 (two) times daily. Patient not taking: Reported on 10/31/2014 09/14/14   Gerhard Munch, MD  cyclobenzaprine (FLEXERIL) 10 MG tablet Take 1 tablet (10 mg total) by mouth 3 (three) times daily as needed (muscle soreness). Patient not taking: Reported on 10/12/2015 07/26/15   Devoria Albe, MD  dicyclomine (BENTYL) 20 MG tablet Take 1 tablet (20 mg total) by mouth every 6 (six) hours as needed for spasms (abdominal cramping). Patient not taking: Reported on 10/12/2015 02/01/15   Samuel Jester, DO  ibuprofen (ADVIL,MOTRIN) 800 MG tablet Take 1 tablet (800 mg total) by mouth every 8 (eight) hours as needed for mild pain. Patient not taking: Reported on 01/11/2015 11/02/14   Kristen N Ward, DO  naproxen (NAPROSYN) 500 MG tablet Take 1 po BID with food prn pain Patient not taking: Reported on 10/12/2015 07/26/15   Devoria Albe, MD  oxycodone (OXY-IR) 5 MG capsule Take 1 capsule (5 mg total) by mouth every 6 (six) hours as needed. Patient not taking: Reported on 02/01/2015 01/12/15   Heidi Quale, PA-C  pantoprazole (PROTONIX) 40 MG tablet Take 1 tablet (40 mg total) by mouth daily. Patient not taking: Reported on 10/31/2014 06/07/14   Nita Sells Mikhail, DO  promethazine (PHENERGAN) 25 MG tablet Take 1 tablet (25 mg total) by mouth every 6 (six) hours  as needed for nausea or vomiting. Patient not taking: Reported on 10/12/2015 02/01/15   Samuel Jester, DO  sulfamethoxazole-trimethoprim (BACTRIM DS,SEPTRA DS) 800-160 MG per tablet Take 2 tablets by mouth 2 (two) times daily. Patient not taking: Reported on 10/12/2015 05/10/15   Rolland Porter, MD  sulfamethoxazole-trimethoprim (BACTRIM DS,SEPTRA DS) 800-160 MG per tablet Take 2 tablets by mouth 2 (two) times daily. Patient not taking: Reported on 10/12/2015 05/10/15   Rolland Porter, MD  traMADol (ULTRAM) 50 MG tablet Take 1 tablet (50 mg total) by mouth every 6 (six) hours as  needed. Patient not taking: Reported on 10/12/2015 05/10/15   Rolland Porter, MD  traMADol (ULTRAM) 50 MG tablet Take 1 tablet (50 mg total) by mouth every 6 (six) hours as needed for moderate pain. Patient not taking: Reported on 10/12/2015 05/10/15   Rolland Porter, MD   BP 115/76 mmHg  Pulse 90  Temp(Src) 97.7 F (36.5 C) (Oral)  Resp 18  Ht  (1.702 m)  Wt 140 lb (63.504 kg)  BMI 21.92 kg/m2  SpO2 100% Physical Exam  Constitutional: She is oriented to person, place, and time. She appears well-developed and well-nourished.  Non-toxic appearance.  HENT:  Head: Normocephalic.  Right Ear: Tympanic membrane and external ear normal.  Left Ear: Tympanic membrane and external ear normal.  Patient has an abscess with a scab in the Center at the tip of the nose. There is no invasion in the mucosa of the nose. No invasion involving the septum. There no red streaks noted about the face. There no satellite abscess areas about the face.  Eyes: EOM and lids are normal. Pupils are equal, round, and reactive to light.  Neck: Normal range of motion. Neck supple. Carotid bruit is not present.  Cardiovascular: Normal rate, regular rhythm, normal heart sounds, intact distal pulses and normal pulses.   Pulmonary/Chest: Breath sounds normal. No respiratory distress.  Abdominal: Soft. Bowel sounds are normal. There is no tenderness. There is no guarding.  Musculoskeletal: Normal range of motion.  Pain with attempted range of motion of the right shoulder. No evidence of dislocation.  Lymphadenopathy:       Head (right side): No submandibular adenopathy present.       Head (left side): No submandibular adenopathy present.    She has no cervical adenopathy.  Neurological: She is alert and oriented to person, place, and time. She has normal strength. No cranial nerve deficit or sensory deficit. She exhibits normal muscle tone. Coordination normal.  Skin: Skin is warm and dry.  Psychiatric: She has a normal mood  and affect. Her speech is normal.  Nursing note and vitals reviewed.   ED Course  Procedures (including critical care time) Labs Review Labs Reviewed - No data to display  Imaging Review No results found. I have personally reviewed and evaluated these images and lab results as part of my medical decision-making.   EKG Interpretation None      MDM  Patient has an abscess of the nose. There no red streaks at this time. No high fever reported. I've advised the patient to use warm compresses 3 or 4 times daily. The patient will be placed on Bactrim 2 times daily. Patient will also be placed on mobic for inflammation and discomfort. Patient referred to Dr. Lovell Sheehan for surgical evaluation if not improving.    Final diagnoses:  None    **I have reviewed nursing notes, vital signs, and all appropriate lab and imaging results for this patient.*  Heidi QualeHobson Isabellah Sobocinski, PA-C 10/12/15 2236  Linwood DibblesJon Knapp, MD 10/12/15 [phone]

## 2015-11-02 ENCOUNTER — Emergency Department (HOSPITAL_COMMUNITY)
Admission: EM | Admit: 2015-11-02 | Discharge: 2015-11-02 | Disposition: A | Discharge status: Home / Self Care | Payer: Self-pay | Attending: Emergency Medicine | Admitting: Emergency Medicine

## 2015-11-02 ENCOUNTER — Encounter (HOSPITAL_COMMUNITY): Payer: Self-pay

## 2015-11-02 DIAGNOSIS — G8929 Other chronic pain: Secondary | ICD-10-CM | POA: Insufficient documentation

## 2015-11-02 DIAGNOSIS — F1210 Cannabis abuse, uncomplicated: Secondary | ICD-10-CM | POA: Insufficient documentation

## 2015-11-02 DIAGNOSIS — Z880 Allergy status to penicillin: Secondary | ICD-10-CM | POA: Insufficient documentation

## 2015-11-02 DIAGNOSIS — Z8619 Personal history of other infectious and parasitic diseases: Secondary | ICD-10-CM | POA: Insufficient documentation

## 2015-11-02 DIAGNOSIS — F1910 Other psychoactive substance abuse, uncomplicated: Secondary | ICD-10-CM

## 2015-11-02 DIAGNOSIS — F1310 Sedative, hypnotic or anxiolytic abuse, uncomplicated: Secondary | ICD-10-CM | POA: Insufficient documentation

## 2015-11-02 DIAGNOSIS — Z8659 Personal history of other mental and behavioral disorders: Secondary | ICD-10-CM | POA: Insufficient documentation

## 2015-11-02 DIAGNOSIS — Z3202 Encounter for pregnancy test, result negative: Secondary | ICD-10-CM | POA: Insufficient documentation

## 2015-11-02 DIAGNOSIS — F1110 Opioid abuse, uncomplicated: Secondary | ICD-10-CM | POA: Insufficient documentation

## 2015-11-02 DIAGNOSIS — F17210 Nicotine dependence, cigarettes, uncomplicated: Secondary | ICD-10-CM | POA: Insufficient documentation

## 2015-11-02 LAB — COMPREHENSIVE METABOLIC PANEL
ALT: 38 U/L (ref 14–54)
AST: 29 U/L (ref 15–41)
Albumin: 4 g/dL (ref 3.5–5.0)
Alkaline Phosphatase: 74 U/L (ref 38–126)
Anion gap: 8 (ref 5–15)
BUN: 9 mg/dL (ref 6–20)
CALCIUM: 9.5 mg/dL (ref 8.9–10.3)
CHLORIDE: 104 mmol/L (ref 101–111)
CO2: 30 mmol/L (ref 22–32)
CREATININE: 0.79 mg/dL (ref 0.44–1.00)
GFR calc Af Amer: >60 mL/min (ref >60)
GFR calc non Af Amer: >60 mL/min (ref >60)
Glucose, Bld: 103 mg/dL — ABNORMAL HIGH (ref 65–99)
Potassium: 3.7 mmol/L (ref 3.5–5.1)
Sodium: 142 mmol/L (ref 135–145)
Total Bilirubin: 0.4 mg/dL (ref 0.3–1.2)
Total Protein: 8.2 g/dL — ABNORMAL HIGH (ref 6.5–8.1)

## 2015-11-02 LAB — RAPID URINE DRUG SCREEN, HOSP PERFORMED
Amphetamines: NOT DETECTED
Barbiturates: NOT DETECTED
Benzodiazepines: POSITIVE — AB
COCAINE: NOT DETECTED
Opiates: POSITIVE — AB
Tetrahydrocannabinol: POSITIVE — AB

## 2015-11-02 LAB — CBC WITH DIFFERENTIAL/PLATELET
BASOS ABS: 0 10*3/uL (ref 0.0–0.1)
Basophils Relative: 0 %
Eosinophils Absolute: 0.1 10*3/uL (ref 0.0–0.7)
Eosinophils Relative: 2 %
HCT: 41.2 % (ref 36.0–46.0)
HEMOGLOBIN: 13 g/dL (ref 12.0–15.0)
Lymphocytes Relative: 31 %
Lymphs Abs: 2.2 10*3/uL (ref 0.7–4.0)
MCH: 26.5 pg (ref 26.0–34.0)
MCHC: 31.6 g/dL (ref 30.0–36.0)
MCV: 83.9 fL (ref 78.0–100.0)
MONO ABS: 0.5 10*3/uL (ref 0.1–1.0)
Monocytes Relative: 7 %
NEUTROS ABS: 4.2 10*3/uL (ref 1.7–7.7)
NEUTROS PCT: 60 %
Platelets: 254 10*3/uL (ref 150–400)
RBC: 4.91 MIL/uL (ref 3.87–5.11)
RDW: 13.7 % (ref 11.5–15.5)
WBC: 7 10*3/uL (ref 4.0–10.5)

## 2015-11-02 LAB — ETHANOL

## 2015-11-02 LAB — PREGNANCY, URINE: Preg Test, Ur: NEGATIVE

## 2015-11-02 NOTE — ED Notes (Signed)
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  Pt here with Marliss CootsEmily Haynes, DSS and says pt is accepted at [facility] but needs medical clearance.  Pt says has Hep C and [facility] wanted her liver to be checked.

## 2015-11-02 NOTE — Discharge Instructions (Signed)
°Emergency Department Resource Guide °1) Find a Doctor and Pay Out of Pocket °Although you won't have to find out who is covered by your insurance plan, it is a good idea to ask around and get recommendations. You will then need to call the office and see if the doctor you have chosen will accept you as a new patient and what types of options they offer for patients who are self-pay. Some doctors offer discounts or will set up payment plans for their patients who do not have insurance, but you will need to ask so you aren't surprised when you get to your appointment. ° °2) Contact Your Local Health Department °Not all health departments have doctors that can see patients for sick visits, but many do, so it is worth a call to see if yours does. If you don't know where your local health department is, you can check in your phone book. The CDC also has a tool to help you locate your state's health department, and many state websites also have listings of all of their local health departments. ° °3) Find a Walk-in Clinic °If your illness is not likely to be very severe or complicated, you may want to try a walk in clinic. These are popping up all over the country in pharmacies, drugstores, and shopping centers. They're usually staffed by nurse practitioners or physician assistants that have been trained to treat common illnesses and complaints. They're usually fairly quick and inexpensive. However, if you have serious medical issues or chronic medical problems, these are probably not your best option. ° °No Primary Care Doctor: °- Call Health Connect at  832-8000 - they can help you locate a primary care doctor that  accepts your insurance, provides certain services, etc. °- Physician Referral Service- 1-800-533-3463 ° °Chronic Pain Problems: °Organization         Address  Phone   Notes  °[facility]  (336) [phone] Patients need to be referred by their primary care doctor.  ° °Medication  Assistance: °Organization         Address  Phone   Notes  °[place] [facility] [address] °[place], [place] [address] (336) 641-8030 --Must be a resident of [place] °-- Must have NO insurance coverage whatsoever (no Medicaid/ Medicare, etc.) °-- The pt. MUST have a primary care doctor that directs their care regularly and follows them in the community °  °[facility]  (866) 331-1348   °[employer]  (888) 892-1162   ° °Agencies that provide inexpensive medical care: °Organization         Address  Phone   Notes  °[facility]  (336) 832-8035   °[facility]    (336) 832-7272   °[facility] [address] °[place], [place] [address] (336) 832-4777   °[facility] [address], °[place] (336) 271-4999   °[facility]    (336) 373-0678   °[facility]    (336) 272-1050   °[facility] ° [address], [place] Phone:  (336) 832-4444, Fax:  (336) 832-4440 Hours of Operation:  9 am - 6 pm, M-F.  Also accepts Medicaid/Medicare and self-pay.  °[facility] ° [address], [place] Phone: (336) [phone], Fax: (336) [phone]. Hours of Operation:  8:30 am - 5:30 pm, M-F.  Also accepts Medicaid and self-pay.  °[facility] [place] [address], [place] Phone: (336) 878-6027   °[facility] [address], [place], [place] (336)723-1848, Ext. 123 Mondays & Thursdays: 7-9 AM.  First 15 patients are seen on a first come, first serve basis. °  ° °Medicaid-accepting [place] Providers: ° °Organization         Address  Phone   Notes  °[facility] [address], [place] (336) 641-2100 Also accepts self-pay patients.  °[facility] [address], [place] ° (336) 856-9996   °[facility] [address], [place]  (336) 288-8857   °[facility] [address] [place] [address], [place] (336) 299-7000   °Veita Bland [address], [place]  ° (336) 373-1557 Only accepts [place] Access Medicaid patients after they have their name applied to their card.  ° °Self-Pay (no insurance) in [place]: ° °Organization         Address  Phone   Notes  °Sickle Cell Patients, [facility] [address], [place] (336) 832-1970   °[facility] [address], [place] (336) 832-4400   °[facility] ° [address], [place] (336) 992-4800   °[facility]/Dr. Osei-Bonsu ° [address] [place] [address], [place] or [address], [place] (336) 841-8500 Phone number for both [place] and [place] locations is the same.  °[facility] [address], [place] (336) 299-0000   °[facility] [address] [place] [address], [place] or [address] (336) 852-7530 °(336) 878-2260   °[facility] [address], [place] (336) 350-1642, phone; (336) 294-5005, fax Sees patients 1st and 3rd Saturday of every month.  Must not qualify for public or private insurance (i.e. Medicaid, Medicare, [place] Health Choice, Veterans' Benefits) • Household income should be no more than 200% of the poverty level •The clinic cannot treat you if you are pregnant or think you are pregnant • Sexually transmitted diseases are not treated at the clinic.  ° ° °Dental Care: °Organization         Address  Phone  Notes  °[place] [facility] [address], [place] (336) 641-6152 Accepts children up to age 21 who are enrolled in Medicaid or [place] Health Choice; pregnant women with a Medicaid card; and children who have applied for Medicaid or [place] Health Choice, but were declined, whose parents can pay a reduced fee at time of service.  °[place]  [facility] [place]  [address], [place] (336) 641-7733 Accepts children up to age 21 who are enrolled in Medicaid or [place] Health Choice; pregnant women with a Medicaid card; and children who have applied for Medicaid or [place] Health Choice, but were declined, whose parents can pay a reduced fee at time of service.  °[facility] ° [address], [place] (336) 641-4533 Patients are seen by appointment only. Walk-ins are not accepted. [facility] will see patients 18 years of age and older. °Monday - Tuesday (8am-5pm) °Most Wednesdays (8:30-5pm) °$30 per visit, cash only  °[facility] ° [address], [place] (336) 641-4533 Patients are seen by appointment only. Walk-ins are not accepted. [facility] will see patients 18 years of age and older. °One   Wednesday Evening (Monthly: Volunteer Based).  $30 per visit, cash only  °[facility]  (919) 537-3737 for adults; Children under age 4, call Graduate Pediatric Dentistry at ([phone]) [phone]. Children aged 4-14, please call ([phone]) [phone] to request a pediatric application. ° Dental services are provided in all areas of dental care including fillings, crowns and bridges, complete and partial dentures, implants, gum treatment, root canals, and extractions. Preventive care is also provided. Treatment is provided to both adults and children. °Patients are selected via a lottery and there is often a waiting list. °  °[facility] [address], °[place] ° (336) 763-8833 [email or web address] °  °[facility] [address], [place], [place] (336)723-1848, Ext. 123 Second and Fourth Thursday of each month, opens at 6:30 AM; Clinic ends at 9 AM.  Patients are seen on a first-come first-served basis, and a limited number are seen during each clinic.  ° °[facility] ° [address], [place], [place] (336) 723-7904    Eligibility Requirements °You must have lived in [place], [place], or [place] counties for at least the last three months. °  You cannot be eligible for state or federal sponsored healthcare insurance, including Veterans Administration, Medicaid, or Medicare. °  You generally cannot be eligible for healthcare insurance through your employer.  °  How to apply: °Eligibility screenings are held every Tuesday and Wednesday afternoon from 1:00 pm until 4:00 pm. You do not need an appointment for the interview!  °[facility] [address], [place], [place] 336-631-2330   °[facility]  336-342-8273   °[facility]  336-703-3100   °[facility]  336-570-6415   ° °Behavioral Health Resources in the Community: °Intensive Outpatient Programs °Organization         Address  Phone  Notes  °[facility] [address], [place], [place] 336-878-6098   °[facility] [address], [place], [place] 336-832-9800   °[facility] [address], [place], [place] ° 336-882-2125   °[facility] [address],  °[place], [place] 1-800-853-5163 or 336-641-4981   °Substance Abuse Resources °Organization         Address  Phone  Notes  °[facility]  336-882-2125   °[facility]  336-784-9470   °[facility]  336-285-9073   °[facility]  336-845-3988   °Residential & Outpatient Substance Abuse Program  1-800-659-3381   °Psychological Services °Organization         Address  Phone  Notes  °[facility]  336- 832-9600   °[facility]  336- 378-7881   °[facility] [address], [place] 1-800-853-5163 or 336-641-4981   ° °Mobile Crisis Teams °Organization         Address  Phone  Notes  °[facility], Mobile Crisis Care Unit  1-877-626-1772   °Assertive °[facility] ° [address]  [place], [place] 336-834-9664   °Sharon DeEsch [address] °[place] 336-554-5454   ° °Self-Help/Support Groups °Organization         Address  Phone             Notes  °[facility] of  - variety of support groups  336- 373-1402 Call for more information  °Narcotics Anonymous (NA), [facility] [address], °[place]  2 meetings at this location  ° °  Residential Treatment Programs Organization         Address  Phone  Notes  [facility] [address],    [place]  1-610-960-45401-(220)[phone]   [facility]  [address], [place] [phone], [place], [place] [phone]   [facility] [address], [place] [phone] Admissions: 8am-3pm M-F  [facility] [address]    [place], [place] [phone]   [facility] [address], [place], [place] [phone]   [facility] [address]  [place], [place] [phone](825)[phone]   [facility] Intensive Outpatient [address], [place], [place] [phone]   [facility] [address]  [place], [place] [phone] or [phone]   [facility] ([facility] [address] [place], [place] [phone] Accepts Medicaid  [facility] [address]  [place] [phone] Substance Abuse/Addiction Treatment   [facility]         Address  Phone  Notes  [facility]  929-212-1945(888) 236-062-6653   Angie FavaJulie Brannon, PhD [address], [address] [place], [place]   [phone](336) [phone] or [phone](336) [phone]   [facility]   [address] [place], [place] [phone](336) [phone]   [facility] [address], [place], [place] [phone](336) ([phone] Insurance/Medicaid/sponsorship through [facility] [address]                                    [place], [place] [phone](336) ([phone] Therapy/tele-psych/case    [facility] [address]   [place], [place] [phone](336) [phone]    Dr. Lolly MustacheArfeen  [phone](336) [phone]   [facility] [place]  [employer] [facility] 1) [address], [place] 2) [address], [place] 3)  [address], [place] [phone](336) [phone](336) [phone]  ([phone](336) [phone]   [facility] Child Abuse Hotline [phone](336) [phone] or (501)[phone](336) [phone] (After Hours)      Go to [facility] now as previously arranged.

## 2015-11-02 NOTE — ED Provider Notes (Signed)
CSN: [id number]     Arrival date & time 11/02/15  1409 History   First MD Initiated Contact with Patient 11/02/15 1709     Chief Complaint  Patient presents with  . detox       HPI Pt was seen at 1740. 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 Denies any complaints.    Past Medical History  Diagnosis Date  . Chronic shoulder pain   . Anxiety   . Hepatitis C   . Polysubstance abuse    Past Surgical History  Procedure Laterality Date  . Ankle surgery     Family History  Problem Relation Age of Onset  . Cirrhosis Father   . Hepatitis C Father    Social History  Substance Use Topics  . Smoking status: Current Every Day Smoker -- 1.00 packs/day    Types: Cigarettes  . Smokeless tobacco: None  . Alcohol Use: Yes     Comment: denies    Review of Systems ROS: Statement: All systems negative except as marked or noted in the HPI; Constitutional: Negative for fever and chills. ; ; Eyes: Negative for eye pain, redness and discharge. ; ; ENMT: Negative for ear pain, hoarseness, nasal congestion, sinus pressure and sore throat. ; ; Cardiovascular: Negative for chest pain, palpitations, diaphoresis, dyspnea and peripheral edema. ; ; Respiratory: Negative for cough, wheezing and stridor. ; ; Gastrointestinal: Negative for nausea, vomiting, diarrhea, abdominal pain, blood in stool, hematemesis, jaundice and rectal bleeding. . ; ; Genitourinary: Negative for dysuria, flank pain and hematuria. ; ; Musculoskeletal: Negative for back pain and neck pain. Negative for swelling and trauma.; ; Skin: Negative for pruritus, rash, abrasions, blisters, bruising and skin lesion.; ; Neuro: Negative for headache, lightheadedness and neck stiffness. Negative for weakness, altered level of consciousness , altered mental status, extremity weakness,  paresthesias, involuntary movement, seizure and syncope.; Psych:  No SI, no SA, no HI, no hallucinations.       Allergies  Penicillins; Tylenol; Venlafaxine; and Morphine and related  Home Medications   Prior to Admission medications   Medication Sig Start Date End Date Taking? Authorizing Provider  levonorgestrel (MIRENA) 20 MCG/24HR IUD 1 each by Intrauterine route once.   Yes Historical Provider, MD  Benzyl Alcohol (ULESFIA) 5 % LOTN Apply to hairy areas and leave on for 10 minutes then rinse. Patient not taking: Reported on 10/12/2015 07/26/15   Devoria Albe, MD  cyclobenzaprine (FLEXERIL) 10 MG tablet Take 1 tablet (10 mg total) by mouth 3 (three) times daily as needed (muscle soreness). Patient not taking: Reported on 10/12/2015 07/26/15   Devoria Albe, MD  meloxicam (MOBIC) 7.5 MG tablet Take 1 tablet (7.5 mg total) by mouth daily. Patient not taking: Reported on 11/02/2015 10/12/15   Ivery Quale, PA-C  naproxen (NAPROSYN) 500 MG tablet Take 1 po BID with food prn pain Patient not taking: Reported on 10/12/2015 07/26/15   Devoria Albe, MD   BP 107/72 mmHg  Pulse 80  Resp 13  SpO2 99% Physical Exam  1745: Physical examination:  Nursing notes reviewed; Vital signs and O2 SAT reviewed;  Constitutional: Well developed, Well nourished, Well hydrated, In no acute distress; Head:  Normocephalic, atraumatic; Eyes: EOMI, PERRL, No scleral icterus; ENMT: Mouth and pharynx normal, Mucous membranes moist; Neck: Supple, Full range of motion, No lymphadenopathy;  Cardiovascular: Regular rate and rhythm, No murmur, rub, or gallop; Respiratory: Breath sounds clear & equal bilaterally, No rales, rhonchi, wheezes.  Speaking full sentences with ease, Normal respiratory effort/excursion; Chest: Nontender, Movement normal; Abdomen: Soft, Nontender, Nondistended, Normal bowel sounds; Genitourinary: No CVA tenderness; Extremities: Pulses normal, No tenderness, No edema, No calf edema or asymmetry.; Neuro: AA&Ox3,  Major CN grossly intact.  Speech clear. No gross focal motor or sensory deficits in extremities.; Skin: Color normal, Warm, Dry.   ED Course  Procedures (including critical care time) Labs Review   Imaging Review  I have personally reviewed and evaluated these images and lab results as part of my medical decision-making.   EKG Interpretation None      MDM  MDM Reviewed: nursing note and vitals Interpretation: labs     Results for orders placed or performed during the hospital encounter of 11/02/15  CBC with Differential  Result Value Ref Range   WBC 7.0 4.0 - 10.5 K/uL   RBC 4.91 3.87 - 5.11 MIL/uL   Hemoglobin 13.0 12.0 - 15.0 g/dL   HCT 60.441.2 54.036.0 - 98.146.0 %   MCV 83.9 78.0 - 100.0 fL   MCH 26.5 26.0 - 34.0 pg   MCHC 31.6 30.0 - 36.0 g/dL   RDW 19.113.7 47.811.5 - 29.515.5 %   Platelets 254 150 - 400 K/uL   Neutrophils Relative % 60 %   Neutro Abs 4.2 1.7 - 7.7 K/uL   Lymphocytes Relative 31 %   Lymphs Abs 2.2 0.7 - 4.0 K/uL   Monocytes Relative 7 %   Monocytes Absolute 0.5 0.1 - 1.0 K/uL   Eosinophils Relative 2 %   Eosinophils Absolute 0.1 0.0 - 0.7 K/uL   Basophils Relative 0 %   Basophils Absolute 0.0 0.0 - 0.1 K/uL  Urine rapid drug screen (hosp performed)  Result Value Ref Range   Opiates POSITIVE (A) NONE DETECTED   Cocaine NONE DETECTED NONE DETECTED   Benzodiazepines POSITIVE (A) NONE DETECTED   Amphetamines NONE DETECTED NONE DETECTED   Tetrahydrocannabinol POSITIVE (A) NONE DETECTED   Barbiturates NONE DETECTED NONE DETECTED  Comprehensive metabolic panel  Result Value Ref Range   Sodium 142 135 - 145 mmol/L   Potassium 3.7 3.5 - 5.1 mmol/L   Chloride 104 101 - 111 mmol/L   CO2 30 22 - 32 mmol/L   Glucose, Bld 103 (H) 65 - 99 mg/dL   BUN 9 6 - 20 mg/dL   Creatinine, Ser 6.210.79 0.44 - 1.00 mg/dL   Calcium 9.5 8.9 - 30.810.3 mg/dL   Total Protein 8.2 (H) 6.5 - 8.1 g/dL   Albumin 4.0 3.5 - 5.0 g/dL   AST 29 15 - 41 U/L   ALT 38 14 - 54 U/L   Alkaline  Phosphatase 74 38 - 126 U/L   Total Bilirubin 0.4 0.3 - 1.2 mg/dL   GFR calc non Af Amer >60 >60 mL/min   GFR calc Af Amer >60 >60 mL/min   Anion gap 8 5 - 15  Ethanol  Result Value Ref Range   Alcohol, Ethyl (B) <5 <5 mg/dL  Pregnancy, urine  Result Value Ref Range   Preg Test, Ur NEGATIVE NEGATIVE    1930:  Labs as above. Pt to to go [facility] from ED.     Samuel JesterKathleen Varnika Butz, DO 11/05/15 1943

## 2015-12-31 ENCOUNTER — Emergency Department (HOSPITAL_COMMUNITY): Payer: 59

## 2015-12-31 ENCOUNTER — Emergency Department (HOSPITAL_COMMUNITY)
Admission: EM | Admit: 2015-12-31 | Discharge: 2015-12-31 | Disposition: A | Discharge status: Home / Self Care | Payer: 59 | Attending: Emergency Medicine | Admitting: Emergency Medicine

## 2015-12-31 ENCOUNTER — Encounter (HOSPITAL_COMMUNITY): Payer: Self-pay | Admitting: Emergency Medicine

## 2015-12-31 DIAGNOSIS — S29001A Unspecified injury of muscle and tendon of front wall of thorax, initial encounter: Secondary | ICD-10-CM | POA: Insufficient documentation

## 2015-12-31 DIAGNOSIS — S0101XA Laceration without foreign body of scalp, initial encounter: Secondary | ICD-10-CM

## 2015-12-31 DIAGNOSIS — Z880 Allergy status to penicillin: Secondary | ICD-10-CM | POA: Insufficient documentation

## 2015-12-31 DIAGNOSIS — W01198A Fall on same level from slipping, tripping and stumbling with subsequent striking against other object, initial encounter: Secondary | ICD-10-CM | POA: Insufficient documentation

## 2015-12-31 DIAGNOSIS — S060X1A Concussion with loss of consciousness of 30 minutes or less, initial encounter: Secondary | ICD-10-CM

## 2015-12-31 DIAGNOSIS — Z23 Encounter for immunization: Secondary | ICD-10-CM | POA: Insufficient documentation

## 2015-12-31 DIAGNOSIS — Y9289 Other specified places as the place of occurrence of the external cause: Secondary | ICD-10-CM | POA: Insufficient documentation

## 2015-12-31 DIAGNOSIS — Z79899 Other long term (current) drug therapy: Secondary | ICD-10-CM | POA: Insufficient documentation

## 2015-12-31 DIAGNOSIS — Z8619 Personal history of other infectious and parasitic diseases: Secondary | ICD-10-CM | POA: Insufficient documentation

## 2015-12-31 DIAGNOSIS — F17210 Nicotine dependence, cigarettes, uncomplicated: Secondary | ICD-10-CM | POA: Insufficient documentation

## 2015-12-31 DIAGNOSIS — S0990XA Unspecified injury of head, initial encounter: Secondary | ICD-10-CM

## 2015-12-31 DIAGNOSIS — Z793 Long term (current) use of hormonal contraceptives: Secondary | ICD-10-CM | POA: Insufficient documentation

## 2015-12-31 DIAGNOSIS — G8929 Other chronic pain: Secondary | ICD-10-CM | POA: Insufficient documentation

## 2015-12-31 DIAGNOSIS — Z8659 Personal history of other mental and behavioral disorders: Secondary | ICD-10-CM | POA: Insufficient documentation

## 2015-12-31 DIAGNOSIS — Y998 Other external cause status: Secondary | ICD-10-CM | POA: Insufficient documentation

## 2015-12-31 DIAGNOSIS — Y9389 Activity, other specified: Secondary | ICD-10-CM | POA: Insufficient documentation

## 2015-12-31 MED ORDER — BACITRACIN ZINC 500 UNIT/GM EX OINT
1.0000 "application " | TOPICAL_OINTMENT | Freq: Two times a day (BID) | CUTANEOUS | Status: DC
  Administered 2015-12-31: 1 via TOPICAL
  Filled 2015-12-31: qty 0.9

## 2015-12-31 MED ORDER — HYDROMORPHONE HCL 1 MG/ML IJ SOLN
0.5000 mg | Freq: Once | INTRAMUSCULAR | Status: AC
  Administered 2015-12-31: 0.5 mg via INTRAMUSCULAR
  Filled 2015-12-31: qty 1

## 2015-12-31 MED ORDER — TETANUS-DIPHTH-ACELL PERTUSSIS 5-2.5-18.5 LF-MCG/0.5 IM SUSP
0.5000 mL | Freq: Once | INTRAMUSCULAR | Status: AC
  Administered 2015-12-31: 0.5 mL via INTRAMUSCULAR
  Filled 2015-12-31: qty 0.5

## 2015-12-31 MED ORDER — OXYCODONE-ACETAMINOPHEN 5-325 MG PO TABS
2.0000 | ORAL_TABLET | Freq: Once | ORAL | Status: DC
  Filled 2015-12-31: qty 2

## 2015-12-31 MED ORDER — OXYCODONE HCL 5 MG PO TABS: 5.0000 mg | ORAL_TABLET | ORAL | Status: DC | PRN

## 2015-12-31 NOTE — ED Notes (Addendum)
Patient reports falling this morning and hitting head on tub. One large laceration noted approx 5cm in length. No active bleeding. Patient also reports hitting right eye. Abrasion noted. Patient reports possible LOC approx 30 seconds. Patient reports being drowsy since fall. Denies any vomiting but does report nausea and blurred vision. Patient does report taking an oxycodone after fall.

## 2015-12-31 NOTE — ED Provider Notes (Signed)
CSN: [id number]     Arrival date & time 12/31/15  1503 History   First MD Initiated Contact with Patient 12/31/15 1524     Chief Complaint  Patient presents with  . Head Injury     (Consider location/radiation/quality/duration/timing/severity/associated sxs/prior Treatment) HPI Comments: The pt is accompanied by her cousin - reports that she fell this AM and hit he head on the sharp edge of a shower block - she lost her balance when her foot got caught in the rug - there was a LOC - she reports having increased sleepiness and can't stay awake - also reports severe headache and L sided rib pain - no sob, abd pain, LE pain or swelling and no pain to the bilateral UE's.  She has a laceration to her L scalp which was bleeding - controlled with pressure - unsure of last TDAP.  Sx are constant, moderate to severe.   Patient is a 30 y.o. female presenting with head injury. The history is provided by the patient and a relative.  Head Injury   Past Medical History  Diagnosis Date  . Chronic shoulder pain   . Anxiety   . Hepatitis C   . Polysubstance abuse    Past Surgical History  Procedure Laterality Date  . Ankle surgery     Family History  Problem Relation Age of Onset  . Cirrhosis Father   . Hepatitis C Father    Social History  Substance Use Topics  . Smoking status: Current Every Day Smoker -- 1.00 packs/day    Types: Cigarettes  . Smokeless tobacco: Never Used  . Alcohol Use: No   OB History    No data available     Review of Systems  All other systems reviewed and are negative.     Allergies  Penicillins; Tylenol; Venlafaxine; and Morphine and related  Home Medications   Prior to Admission medications   Medication Sig Start Date End Date Taking? Authorizing Provider  IRON PO Take 1 tablet by mouth daily.   Yes Historical Provider, MD  Multiple Vitamin (MULTIVITAMIN WITH MINERALS) TABS tablet Take 1 tablet by mouth daily.   Yes Historical Provider, MD   levonorgestrel (MIRENA) 20 MCG/24HR IUD 1 each by Intrauterine route once.    Historical Provider, MD  naproxen (NAPROSYN) 500 MG tablet Take 1 po BID with food prn pain Patient not taking: Reported on 10/12/2015 07/26/15   Devoria AlbeIva Knapp, MD  oxyCODONE (OXY IR/ROXICODONE) 5 MG immediate release tablet Take 1 tablet (5 mg total) by mouth every 4 (four) hours as needed for severe pain. 12/31/15   Eber HongBrian Erminie Foulks, MD   BP 114/84 mmHg  Pulse 104  Temp(Src) 99.3 F (37.4 C) (Temporal)  Resp 20  Ht 5\' 7"  (1.702 m)  Wt 130 lb (58.968 kg)  BMI 20.36 kg/m2  SpO2 99% Physical Exam  Constitutional: She appears well-developed and well-nourished. No distress.  HENT:  Head: Normocephalic.  Mouth/Throat: Oropharynx is clear and moist. No oropharyngeal exudate.  no facial tenderness, deformity, malocclusion or hemotympanum.  no battle's sign or racoon eyes.  laeration to the L parietal scalp - sub Q - no galea or scalp visualized.   Eyes: Conjunctivae and EOM are normal. Pupils are equal, round, and reactive to light. Right eye exhibits no discharge. Left eye exhibits no discharge. No scleral icterus.  Neck: No JVD present. No thyromegaly present.  Cardiovascular: Normal rate, regular rhythm, normal heart sounds and intact distal pulses.  Exam reveals no gallop and  no friction rub.   No murmur heard. Pulmonary/Chest: Effort normal and breath sounds normal. No respiratory distress. She has no wheezes. She has no rales. She exhibits tenderness ( L sided ribs ttp).  Abdominal: Soft. Bowel sounds are normal. She exhibits no distension and no mass. There is no tenderness.  Musculoskeletal: Normal range of motion. She exhibits no edema or tenderness.  Supple joints and soft compartments diffusely - no C,T,L spine ttp.  Lymphadenopathy:    She has no cervical adenopathy.  Neurological: She is alert. Coordination normal.  Normal FNF, normal strengith in all 4 extremities - appears mildly somnolent - speech is  appropriate, crying throughout.  Skin: Skin is warm and dry. No rash noted. No erythema.  Laceration of scalp, otherwise unremarkable.  Psychiatric: She has a normal mood and affect. Her behavior is normal.  Nursing note and vitals reviewed.   ED Course  Procedures (including critical care time) Labs Review Labs Reviewed - No data to display  Imaging Review Dg Ribs Unilateral W/chest Left  12/31/2015  CLINICAL DATA:  Fall today striking head. Possible loss of consciousness. EXAM: LEFT RIBS AND CHEST - 3+ VIEW COMPARISON:  Chest radiographs 12/25/2014 and 08/13/2009. FINDINGS: The heart size and mediastinal contours are stable. The lungs are clear. There is no pleural effusion or pneumothorax. Views of the left ribs demonstrate no acute fracture or focal rib lesion. IMPRESSION: No evidence of rib fracture, pleural effusion or pneumothorax. Electronically Signed   By: Carey Bullocks M.D.   On: 12/31/2015 16:26   Ct Head Wo Contrast  12/31/2015  CLINICAL DATA:  Patient reports falling this morning and hitting head on tub. One large laceration noted approx 5cm in length. No active bleeding. Patient also reports hitting right eye. Abrasion noted. EXAM: CT HEAD WITHOUT CONTRAST CT CERVICAL SPINE WITHOUT CONTRAST TECHNIQUE: Multidetector CT imaging of the head and cervical spine was performed following the standard protocol without intravenous contrast. Multiplanar CT image reconstructions of the cervical spine were also generated. COMPARISON:  None. FINDINGS: CT HEAD FINDINGS No intracranial hemorrhage. No parenchymal contusion. No midline shift or mass effect. Basilar cisterns are patent. No skull base fracture. No fluid in the paranasal sinuses or mastoid air cells. Orbits are normal. CT CERVICAL SPINE FINDINGS No prevertebral soft tissue swelling. Normal alignment of cervical vertebral bodies. No loss of vertebral body height. Normal facet articulation. Normal craniocervical junction. No evidence  epidural or paraspinal hematoma. IMPRESSION: 1. No Intracranial trauma. 2. LEFT scalp laceration 3. No cervical spine fracture Electronically Signed   By: Genevive Bi M.D.   On: 12/31/2015 16:29   Ct Cervical Spine Wo Contrast  12/31/2015  CLINICAL DATA:  Patient reports falling this morning and hitting head on tub. One large laceration noted approx 5cm in length. No active bleeding. Patient also reports hitting right eye. Abrasion noted. EXAM: CT HEAD WITHOUT CONTRAST CT CERVICAL SPINE WITHOUT CONTRAST TECHNIQUE: Multidetector CT imaging of the head and cervical spine was performed following the standard protocol without intravenous contrast. Multiplanar CT image reconstructions of the cervical spine were also generated. COMPARISON:  None. FINDINGS: CT HEAD FINDINGS No intracranial hemorrhage. No parenchymal contusion. No midline shift or mass effect. Basilar cisterns are patent. No skull base fracture. No fluid in the paranasal sinuses or mastoid air cells. Orbits are normal. CT CERVICAL SPINE FINDINGS No prevertebral soft tissue swelling. Normal alignment of cervical vertebral bodies. No loss of vertebral body height. Normal facet articulation. Normal craniocervical junction. No evidence epidural or paraspinal  hematoma. IMPRESSION: 1. No Intracranial trauma. 2. LEFT scalp laceration 3. No cervical spine fracture Electronically Signed   By: Genevive Bi M.D.   On: 12/31/2015 16:29   I have personally reviewed and evaluated these images and lab results as part of my medical decision-making.    MDM   Final diagnoses:  Head injury, initial encounter  Laceration of scalp, initial encounter  Concussion, with loss of consciousness of 30 minutes or less, initial encounter    Evaluate for inuries to scalp / skull, neck and ribs - VS unremarkable - pain meds, update TDAP PRN.  Ct neg for frx / ICH / spinal frx or rib frx / lung path,  Needs staples - see below.  LACERATION REPAIR Performed  by: Vida Roller Authorized by: Vida Roller Consent: Verbal consent obtained. Risks and benefits: risks, benefits and alternatives were discussed Consent given by: patient Patient identity confirmed: provided demographic data Prepped and Draped in normal sterile fashion Wound explored  Laceration Location: Scalp  Laceration Length: 6cm  No Foreign Bodies seen or palpated  Anesthesia: local infiltration  Local anesthetic: none  Irrigation method: lavage  Amount of cleaning: standard  Skin closure: 3 staples  Number of sutures: 3  Technique: staples.  Patient tolerance: Patient tolerated the procedure well with no immediate complications.   Bacitracin - no signs of contamination.   Eber Hong, MD 12/31/15 [phone]

## 2015-12-31 NOTE — Discharge Instructions (Signed)
Staples need to come out in 7-10 days - go to doctor for this  [facility]    Kari BaarsEdward Hawkins MD. Specialty: Pulmonary Disease Contact information: [address]  [address]  [place] [address]  [phone]   Syliva OvermanMargaret Simpson, MD. Specialty: [facility] Contact information: [address]  [place] [address]  [phone]   Lilyan PuntScott Luking, MD. Specialty: [facility] Contact information: [address]  [address]  [place] [address]  [phone]   Avon Gullyesfaye Fanta, MD Specialty: Internal Medicine Contact information: [address]  [place] [address]  [phone]   Catalina PizzaZach Hall, MD. Specialty: Internal Medicine Contact information: [address]  [place] [address]  [phone]   Butch PennyAngus Mcinnis, MD. Specialty: Family Medicine Contact information: [address]  [place] [address]  [phone]   John GiovanniStephen Knowlton, MD. Specialty: Family Medicine Contact information: [address]  [address]  [place] [address]  [phone]   Carylon Perchesoy Fagan, MD. Specialty: Internal Medicine Contact information: [address]  [address]  [place] [address]  [phone]   Please obtain all of your results from medical records or have your doctors office obtain the results - share them with your doctor - you should be seen at your doctors office in the next 2 days. Call today to arrange your follow up. Take the medications as prescribed. Please review all of the medicines and only take them if you do not have an allergy to them. Please be aware that if you are taking birth control pills, taking other prescriptions, ESPECIALLY ANTIBIOTICS may make the birth control ineffective - if this is the case, either do not engage in sexual activity or use alternative methods of birth control such as condoms until you have finished the medicine and your family doctor says it is OK to restart them. If you are on a blood  thinner such as COUMADIN, be aware that any other medicine that you take may cause the coumadin to either work too much, or not enough - you should have your coumadin level rechecked in next 7 days if this is the case.  ?  It is also a possibility that you have an allergic reaction to any of the medicines that you have been prescribed - Everybody reacts differently to medications and while MOST people have no trouble with most medicines, you may have a reaction such as nausea, vomiting, rash, swelling, shortness of breath. If this is the case, please stop taking the medicine immediately and contact your physician.  ?  You should return to the ER if you develop severe or worsening symptoms.

## 2016-01-12 ENCOUNTER — Encounter (HOSPITAL_COMMUNITY): Payer: Self-pay

## 2016-01-12 ENCOUNTER — Emergency Department (HOSPITAL_COMMUNITY)
Admission: EM | Admit: 2016-01-12 | Discharge: 2016-01-12 | Disposition: A | Discharge status: Home / Self Care | Payer: Medicaid Other | Attending: Emergency Medicine | Admitting: Emergency Medicine

## 2016-01-12 DIAGNOSIS — Z880 Allergy status to penicillin: Secondary | ICD-10-CM | POA: Insufficient documentation

## 2016-01-12 DIAGNOSIS — Z4802 Encounter for removal of sutures: Secondary | ICD-10-CM | POA: Insufficient documentation

## 2016-01-12 DIAGNOSIS — F17210 Nicotine dependence, cigarettes, uncomplicated: Secondary | ICD-10-CM | POA: Insufficient documentation

## 2016-01-12 DIAGNOSIS — Z8619 Personal history of other infectious and parasitic diseases: Secondary | ICD-10-CM | POA: Insufficient documentation

## 2016-01-12 DIAGNOSIS — Z79899 Other long term (current) drug therapy: Secondary | ICD-10-CM | POA: Insufficient documentation

## 2016-01-12 DIAGNOSIS — Z8659 Personal history of other mental and behavioral disorders: Secondary | ICD-10-CM | POA: Insufficient documentation

## 2016-01-12 DIAGNOSIS — R51 Headache: Secondary | ICD-10-CM | POA: Insufficient documentation

## 2016-01-12 DIAGNOSIS — G8929 Other chronic pain: Secondary | ICD-10-CM | POA: Insufficient documentation

## 2016-01-12 MED ORDER — TRAMADOL HCL 50 MG PO TABS: 50.0000 mg | ORAL_TABLET | Freq: Four times a day (QID) | ORAL | Status: DC | PRN

## 2016-01-12 MED ORDER — NAPROXEN 250 MG PO TABS: 500.0000 mg | ORAL_TABLET | Freq: Once | ORAL | Status: DC

## 2016-01-12 MED ORDER — TRAMADOL HCL 50 MG PO TABS
50.0000 mg | ORAL_TABLET | Freq: Once | ORAL | Status: AC
  Administered 2016-01-12: 50 mg via ORAL
  Filled 2016-01-12: qty 1

## 2016-01-12 NOTE — ED Provider Notes (Signed)
CSN: [id number]     Arrival date & time 01/12/16  1633 History   First MD Initiated Contact with Patient 01/12/16 1646     Chief Complaint  Patient presents with  . Suture / Staple Removal     (Consider location/radiation/quality/duration/timing/severity/associated sxs/prior Treatment) The history is provided by the patient.   Heidi [name] is a 30 y.o. female presenting for removal of staples from her scalp which were placed 12 days ago here after tripping and falling, sustaining scalp laceration.  She denies any problems from the wound, no swelling, drainage but does describe pain around the staple site. She also reports daily headaches since the fall.  She denies dizziness, confusion, focal weakness, nausea or vomiting.  She takes ibuprofen with some relief.  CT scan completed day of injury, no pathology.     Past Medical History  Diagnosis Date  . Chronic shoulder pain   . Anxiety   . Hepatitis C   . Polysubstance abuse    Past Surgical History  Procedure Laterality Date  . Ankle surgery     Family History  Problem Relation Age of Onset  . Cirrhosis Father   . Hepatitis C Father    Social History  Substance Use Topics  . Smoking status: Current Every Day Smoker -- 1.00 packs/day    Types: Cigarettes  . Smokeless tobacco: Never Used  . Alcohol Use: No   OB History    No data available     Review of Systems  Constitutional: Negative for fever and chills.  Eyes: Negative for visual disturbance.  Respiratory: Negative for shortness of breath and wheezing.   Gastrointestinal: Negative for nausea and vomiting.  Skin: Positive for wound.  Neurological: Positive for headaches. Negative for dizziness, weakness and numbness.      Allergies  Penicillins; Tylenol; Venlafaxine; and Morphine and related  Home Medications   Prior to Admission medications   Medication Sig Start Date End Date Taking? Authorizing Provider  IRON PO Take 1 tablet by mouth daily.     Historical Provider, MD  levonorgestrel (MIRENA) 20 MCG/24HR IUD 1 each by Intrauterine route once.    Historical Provider, MD  Multiple Vitamin (MULTIVITAMIN WITH MINERALS) TABS tablet Take 1 tablet by mouth daily.    Historical Provider, MD  naproxen (NAPROSYN) 500 MG tablet Take 1 po BID with food prn pain Patient not taking: Reported on 10/12/2015 07/26/15   Devoria Albe, MD  oxyCODONE (OXY IR/ROXICODONE) 5 MG immediate release tablet Take 1 tablet (5 mg total) by mouth every 4 (four) hours as needed for severe pain. 12/31/15   Eber Hong, MD  traMADol (ULTRAM) 50 MG tablet Take 1 tablet (50 mg total) by mouth every 6 (six) hours as needed. 01/12/16   Burgess Amor, PA-C   BP 124/75 mmHg  Pulse 108  Temp(Src) 98.6 F (37 C) (Oral)  Resp 16  Ht  (1.702 m)  Wt 63.504 kg  BMI 21.92 kg/m2  SpO2 99% Physical Exam  Constitutional: She is oriented to person, place, and time. She appears well-developed and well-nourished.  HENT:  Head: Normocephalic.  Eyes: EOM are normal. Pupils are equal, round, and reactive to light.  Neck: Neck supple.  Cardiovascular: Normal rate.   Pulmonary/Chest: Effort normal.  Musculoskeletal: She exhibits tenderness.  Neurological: She is alert and oriented to person, place, and time. No cranial nerve deficit or sensory deficit. Coordination normal.  Skin: Laceration noted.  Well healed scalp laceration.    ED Course  Procedures (  including critical care time)   SUTURE REMOVAL Performed by: Burgess Amor  Consent: Verbal consent obtained. Consent given by: patient Required items: required blood products, implants, devices, and special equipment available Time out: Immediately prior to procedure a "time out" was called to verify the correct patient, procedure, equipment, support staff and site/side marked as required.  Location: scalp  Wound Appearance: clean  Sutures/Staples Removed: 3  Patient tolerance: Patient tolerated the procedure well with no  immediate complications.    Labs Review Labs Reviewed - No data to display  Imaging Review No results found. I have personally reviewed and evaluated these images and lab results as part of my medical decision-making.   EKG Interpretation None      MDM   Final diagnoses:  Visit for suture removal    Prn f/u anticipated.    Burgess Amor, PA-C 01/12/16 1702  Bethann Berkshire, MD 01/12/16 [phone]

## 2016-01-12 NOTE — ED Notes (Signed)
Pt here to have staples removed, 3 staples noted to the top of the forehead.

## 2016-01-12 NOTE — ED Notes (Signed)
Pt alert & oriented x4, stable gait. Patient given discharge instructions, paperwork & prescription(s). Patient  instructed to stop at the registration desk to finish any additional paperwork. Patient verbalized understanding. Pt left department w/ no further questions. 

## 2016-01-12 NOTE — Discharge Instructions (Signed)

## 2016-01-12 NOTE — ED Notes (Signed)
Pt here for staple removal.  Reports had staples put in head approx 1 week ago.

## 2016-01-25 ENCOUNTER — Encounter: Payer: Self-pay | Admitting: Emergency Medicine

## 2016-01-25 ENCOUNTER — Emergency Department
Admission: EM | Admit: 2016-01-25 | Discharge: 2016-01-25 | Disposition: A | Discharge status: Home / Self Care | Payer: Medicaid Other | Attending: Emergency Medicine | Admitting: Emergency Medicine

## 2016-01-25 DIAGNOSIS — Z880 Allergy status to penicillin: Secondary | ICD-10-CM | POA: Insufficient documentation

## 2016-01-25 DIAGNOSIS — Z79899 Other long term (current) drug therapy: Secondary | ICD-10-CM | POA: Insufficient documentation

## 2016-01-25 DIAGNOSIS — R000 Tachycardia, unspecified: Secondary | ICD-10-CM | POA: Insufficient documentation

## 2016-01-25 DIAGNOSIS — F1210 Cannabis abuse, uncomplicated: Secondary | ICD-10-CM | POA: Insufficient documentation

## 2016-01-25 DIAGNOSIS — F1310 Sedative, hypnotic or anxiolytic abuse, uncomplicated: Secondary | ICD-10-CM | POA: Insufficient documentation

## 2016-01-25 DIAGNOSIS — F1110 Opioid abuse, uncomplicated: Secondary | ICD-10-CM | POA: Insufficient documentation

## 2016-01-25 DIAGNOSIS — F1910 Other psychoactive substance abuse, uncomplicated: Secondary | ICD-10-CM

## 2016-01-25 DIAGNOSIS — E876 Hypokalemia: Secondary | ICD-10-CM

## 2016-01-25 DIAGNOSIS — F1510 Other stimulant abuse, uncomplicated: Secondary | ICD-10-CM | POA: Insufficient documentation

## 2016-01-25 DIAGNOSIS — F17210 Nicotine dependence, cigarettes, uncomplicated: Secondary | ICD-10-CM | POA: Insufficient documentation

## 2016-01-25 HISTORY — DX: Unspecified abnormal cytological findings in specimens from vagina: R87.629

## 2016-01-25 HISTORY — DX: Other psychoactive substance use, unspecified, uncomplicated: F19.90

## 2016-01-25 LAB — URINALYSIS COMPLETE WITH MICROSCOPIC (ARMC ONLY)
BILIRUBIN URINE: NEGATIVE
GLUCOSE, UA: NEGATIVE mg/dL
Ketones, ur: NEGATIVE mg/dL
Leukocytes, UA: NEGATIVE
Nitrite: NEGATIVE
Protein, ur: NEGATIVE mg/dL
Specific Gravity, Urine: 1.021 (ref 1.005–1.030)
pH: 6 (ref 5.0–8.0)

## 2016-01-25 LAB — CBC
HCT: 40.8 % (ref 35.0–47.0)
Hemoglobin: 13.3 g/dL (ref 12.0–16.0)
MCH: 26.3 pg (ref 26.0–34.0)
MCHC: 32.5 g/dL (ref 32.0–36.0)
MCV: 80.8 fL (ref 80.0–100.0)
PLATELETS: 302 10*3/uL (ref 150–440)
RBC: 5.05 MIL/uL (ref 3.80–5.20)
RDW: 13.8 % (ref 11.5–14.5)
WBC: 8.8 10*3/uL (ref 3.6–11.0)

## 2016-01-25 LAB — BASIC METABOLIC PANEL
Anion gap: 8 (ref 5–15)
BUN: 8 mg/dL (ref 6–20)
CALCIUM: 9.3 mg/dL (ref 8.9–10.3)
CO2: 27 mmol/L (ref 22–32)
Chloride: 103 mmol/L (ref 101–111)
Creatinine, Ser: 0.58 mg/dL (ref 0.44–1.00)
GFR calc Af Amer: >60 mL/min (ref >60)
GLUCOSE: 122 mg/dL — AB (ref 65–99)
POTASSIUM: 2.7 mmol/L — AB (ref 3.5–5.1)
SODIUM: 138 mmol/L (ref 135–145)

## 2016-01-25 LAB — URINE DRUG SCREEN, QUALITATIVE (ARMC ONLY)
AMPHETAMINES, UR SCREEN: POSITIVE — AB
BARBITURATES, UR SCREEN: NOT DETECTED
Benzodiazepine, Ur Scrn: POSITIVE — AB
COCAINE METABOLITE, UR ~~LOC~~: NOT DETECTED
Cannabinoid 50 Ng, Ur ~~LOC~~: POSITIVE — AB
MDMA (ECSTASY) UR SCREEN: NOT DETECTED
METHADONE SCREEN, URINE: NOT DETECTED
OPIATE, UR SCREEN: POSITIVE — AB
Phencyclidine (PCP) Ur S: NOT DETECTED
TRICYCLIC, UR SCREEN: NOT DETECTED

## 2016-01-25 LAB — ETHANOL

## 2016-01-25 LAB — ACETAMINOPHEN LEVEL

## 2016-01-25 LAB — SALICYLATE LEVEL: Salicylate Lvl: <4 mg/dL (ref 2.8–30.0)

## 2016-01-25 MED ORDER — NALOXONE HCL 0.4 MG/0.4ML IJ SOAJ: 1.0000 "application " | Freq: Once | INTRAMUSCULAR | Status: DC

## 2016-01-25 MED ORDER — LORAZEPAM 0.5 MG PO TABS
0.5000 mg | ORAL_TABLET | ORAL | Status: AC
  Administered 2016-01-25: 0.5 mg via ORAL
  Filled 2016-01-25: qty 1

## 2016-01-25 MED ORDER — MAGNESIUM SULFATE 2 GM/50ML IV SOLN
2.0000 g | Freq: Once | INTRAVENOUS | Status: AC
  Administered 2016-01-25: 2 g via INTRAVENOUS
  Filled 2016-01-25: qty 50

## 2016-01-25 MED ORDER — POTASSIUM CHLORIDE ER 10 MEQ PO TBCR: 10.0000 meq | EXTENDED_RELEASE_TABLET | Freq: Every day | ORAL | Status: DC

## 2016-01-25 MED ORDER — SODIUM CHLORIDE 0.9 % IV BOLUS (SEPSIS)
1000.0000 mL | Freq: Once | INTRAVENOUS | Status: AC
  Administered 2016-01-25: 1000 mL via INTRAVENOUS

## 2016-01-25 MED ORDER — POTASSIUM CHLORIDE CRYS ER 20 MEQ PO TBCR
40.0000 meq | EXTENDED_RELEASE_TABLET | Freq: Once | ORAL | Status: AC
  Administered 2016-01-25: 40 meq via ORAL
  Filled 2016-01-25: qty 2
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
## 2016-01-25 NOTE — ED Notes (Signed)
Pt reports being here for medical clearance for rehab.  Pt reports opiate addiction; states the last time she used was last night.

## 2016-01-25 NOTE — BH Assessment (Signed)
Received verbal consent from patient to faxed to labs to [facility].  Spoke with [facility] Joni Reining) and they were aware of the patient. Requested labs and after they were sent, the requested UDS. Spoke with the patient nurse Morrie Sheldon). She stated she was going to talk with the ER MD.

## 2016-01-25 NOTE — Discharge Instructions (Signed)
Polysubstance Abuse When people abuse more than one drug or type of drug it is called polysubstance or polydrug abuse. For example, many smokers also drink alcohol. This is one form of polydrug abuse. Polydrug abuse also refers to the use of a drug to counteract an unpleasant effect produced by another drug. It may also be used to help with withdrawal from another drug. People who take stimulants may become agitated. Sometimes this agitation is countered with a tranquilizer. This helps protect against the unpleasant side effects. Polydrug abuse also refers to the use of different drugs at the same time.  Anytime drug use is interfering with normal living activities, it has become abuse. This includes problems with family and friends. Psychological dependence has developed when your mind tells you that the drug is needed. This is usually followed by physical dependence which has developed when continuing increases of drug are required to get the same feeling or "high". This is known as addiction or chemical dependency. A person's risk is much higher if there is a history of chemical dependency in the family. SIGNS OF CHEMICAL DEPENDENCY  You have been told by friends or family that drugs have become a problem.  You fight when using drugs.  You are having blackouts (not remembering what you do while using).  You feel sick from using drugs but continue using.  You lie about use or amounts of drugs (chemicals) used.  You need chemicals to get you going.  You are suffering in work performance or in school because of drug use.  You get sick from use of drugs but continue to use anyway.  You need drugs to relate to people or feel comfortable in social situations.  You use drugs to forget problems. "Yes" answered to any of the above signs of chemical dependency indicates there are problems. The longer the use of drugs continues, the greater the problems will become. If there is a family history of  drug or alcohol use, it is best not to experiment with these drugs. Continual use leads to tolerance. After tolerance develops more of the drug is needed to get the same feeling. This is followed by addiction. With addiction, drugs become the most important part of life. It becomes more important to take drugs than participate in the other usual activities of life. This includes relating to friends and family. Addiction is followed by dependency. Dependency is a condition where drugs are now needed not just to get high, but to feel normal. Addiction cannot be cured but it can be stopped. This often requires outside help and the care of professionals. Treatment centers are listed in the yellow pages under: Cocaine, Narcotics, and Alcoholics Anonymous. Most hospitals and clinics can refer you to a specialized care center. Talk to your caregiver if you need help.   This information is not intended to replace advice given to you by your health care provider. Make sure you discuss any questions you have with your health care provider.   Document Released: 07/24/2005 Document Revised: 02/24/2012 Document Reviewed: 12/07/2014 Elsevier Interactive Patient Education 2016 [employer].  Hypokalemia Hypokalemia means that the amount of potassium in the blood is lower than normal.Potassium is a chemical, called an electrolyte, that helps regulate the amount of fluid in the body. It also stimulates muscle contraction and helps nerves function properly.Most of the body's potassium is inside of cells, and only a very small amount is in the blood. Because the amount in the blood is so small,  minor changes can be life-threatening. CAUSES  Antibiotics.  Diarrhea or vomiting.  Using laxatives too much, which can cause diarrhea.  Chronic kidney disease.  Water pills (diuretics).  Eating disorders (bulimia).  Low magnesium level.  Sweating a lot. SIGNS AND  SYMPTOMS  Weakness.  Constipation.  Fatigue.  Muscle cramps.  Mental confusion.  Skipped heartbeats or irregular heartbeat (palpitations).  Tingling or numbness. DIAGNOSIS  Your health care provider can diagnose hypokalemia with blood tests. In addition to checking your potassium level, your health care provider may also check other lab tests. TREATMENT Hypokalemia can be treated with potassium supplements taken by mouth or adjustments in your current medicines. If your potassium level is very low, you may need to get potassium through a vein (IV) and be monitored in the hospital. A diet high in potassium is also helpful. Foods high in potassium are:  Nuts, such as peanuts and pistachios.  Seeds, such as sunflower seeds and pumpkin seeds.  Peas, lentils, and lima beans.  Whole grain and bran cereals and breads.  Fresh fruit and vegetables, such as apricots, avocado, bananas, cantaloupe, kiwi, oranges, tomatoes, asparagus, and potatoes.  Orange and tomato juices.  Red meats.  Fruit yogurt. HOME CARE INSTRUCTIONS  Take all medicines as prescribed by your health care provider.  Maintain a healthy diet by including nutritious food, such as fruits, vegetables, nuts, whole grains, and lean meats.  If you are taking a laxative, be sure to follow the directions on the label. SEEK MEDICAL CARE IF:  Your weakness gets worse.  You feel your heart pounding or racing.  You are vomiting or having diarrhea.  You are diabetic and having trouble keeping your blood glucose in the normal range. SEEK IMMEDIATE MEDICAL CARE IF:  You have chest pain, shortness of breath, or dizziness.  You are vomiting or having diarrhea for more than 2 days.  You faint. MAKE SURE YOU:   Understand these instructions.  Will watch your condition.  Will get help right away if you are not doing well or get worse.   This information is not intended to replace advice given to you by your health  care provider. Make sure you discuss any questions you have with your health care provider.   Document Released: 12/02/2005 Document Revised: 12/23/2014 Document Reviewed: 06/04/2013 Elsevier Interactive Patient Education [employer]

## 2016-01-25 NOTE — ED Notes (Signed)
Patient left stating she was going straight to [facility] per MD instructions.  Patient is in no obvious distress at this time.

## 2016-01-25 NOTE — ED Notes (Signed)
Pt reports stomach cramping, worsening headache approximately 3-4 minutes after starting magnesium sulfate.  Infusion stopped.  MD aware.
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
## 2016-04-15 ENCOUNTER — Emergency Department (HOSPITAL_COMMUNITY): Payer: Medicaid Other

## 2016-04-15 ENCOUNTER — Encounter (HOSPITAL_COMMUNITY): Payer: Self-pay | Admitting: *Deleted

## 2016-04-15 ENCOUNTER — Emergency Department (HOSPITAL_COMMUNITY)
Admission: EM | Admit: 2016-04-15 | Discharge: 2016-04-15 | Disposition: A | Discharge status: Home / Self Care | Payer: Medicaid Other | Attending: Emergency Medicine | Admitting: Emergency Medicine

## 2016-04-15 DIAGNOSIS — Y999 Unspecified external cause status: Secondary | ICD-10-CM | POA: Insufficient documentation

## 2016-04-15 DIAGNOSIS — Y939 Activity, unspecified: Secondary | ICD-10-CM | POA: Insufficient documentation

## 2016-04-15 DIAGNOSIS — R0789 Other chest pain: Secondary | ICD-10-CM | POA: Insufficient documentation

## 2016-04-15 DIAGNOSIS — M546 Pain in thoracic spine: Secondary | ICD-10-CM | POA: Insufficient documentation

## 2016-04-15 DIAGNOSIS — Z79899 Other long term (current) drug therapy: Secondary | ICD-10-CM | POA: Insufficient documentation

## 2016-04-15 DIAGNOSIS — Y92812 Truck as the place of occurrence of the external cause: Secondary | ICD-10-CM | POA: Insufficient documentation

## 2016-04-15 DIAGNOSIS — F17210 Nicotine dependence, cigarettes, uncomplicated: Secondary | ICD-10-CM | POA: Insufficient documentation

## 2016-04-15 DIAGNOSIS — S0083XA Contusion of other part of head, initial encounter: Secondary | ICD-10-CM | POA: Insufficient documentation

## 2016-04-15 MED ORDER — NAPROXEN 500 MG PO TABS: ORAL_TABLET | ORAL | Status: DC

## 2016-04-15 MED ORDER — ONDANSETRON 4 MG PO TBDP
4.0000 mg | ORAL_TABLET | Freq: Once | ORAL | Status: AC
  Administered 2016-04-15: 4 mg via ORAL
  Filled 2016-04-15: qty 1

## 2016-04-15 MED ORDER — CYCLOBENZAPRINE HCL 10 MG PO TABS
5.0000 mg | ORAL_TABLET | Freq: Once | ORAL | Status: AC
  Administered 2016-04-15: 5 mg via ORAL
  Filled 2016-04-15: qty 1

## 2016-04-15 MED ORDER — CYCLOBENZAPRINE HCL 5 MG PO TABS: 5.0000 mg | ORAL_TABLET | Freq: Three times a day (TID) | ORAL | Status: DC | PRN

## 2016-04-15 MED ORDER — NAPROXEN 250 MG PO TABS
500.0000 mg | ORAL_TABLET | Freq: Once | ORAL | Status: AC
  Administered 2016-04-15: 500 mg via ORAL
  Filled 2016-04-15: qty 2

## 2016-04-15 NOTE — ED Notes (Signed)
[facility]

## 2016-04-15 NOTE — ED Provider Notes (Signed)
No injury to C spine Pt informed of findings on CT C spine including thyroid findings and given copy of CT report with instructions for f/u.  Eber HongBrian Girl Schissler, MD 04/15/16 [phone]

## 2016-04-15 NOTE — ED Notes (Signed)
Investigator at bedside.  Awaiting completion before d/c.

## 2016-04-15 NOTE — ED Notes (Signed)
Pt returns from radiology. She is medicated as ordered. She requests and is provided a snack. She is given cereal, milk and a warm blanket for comfort. She has ambulated to the bathroom with heel to toe ambulation with a smooth and easy gait without stagger or drift

## 2016-04-15 NOTE — Discharge Instructions (Signed)
Ice packs to the painful areas. Take the medications as prescribed. Recheck for any problems on the head injury sheet.  Have a dentist recheck you if you continue to have pain in your right upper jaw or TMJ.    Contusion A contusion is a deep bruise. Contusions happen when an injury causes bleeding under the skin. Symptoms of bruising include pain, swelling, and discolored skin. The skin may turn blue, purple, or yellow. HOME CARE   Rest the injured area.  If told, put ice on the injured area.  Put ice in a plastic bag.  Place a towel between your skin and the bag.  Leave the ice on for 20 minutes, 2-3 times per day.  If told, put light pressure (compression) on the injured area using an elastic bandage. Make sure the bandage is not too tight. Remove it and put it back on as told by your doctor.  If possible, raise (elevate) the injured area above the level of your heart while you are sitting or lying down.  Take over-the-counter and prescription medicines only as told by your doctor. GET HELP IF:  Your symptoms do not get better after several days of treatment.  Your symptoms get worse.  You have trouble moving the injured area. GET HELP RIGHT AWAY IF:   You have very bad pain.  You have a loss of feeling (numbness) in a hand or foot.  Your hand or foot turns pale or cold.   This information is not intended to replace advice given to you by your health care provider. Make sure you discuss any questions you have with your health care provider.   Document Released: 05/20/2008 Document Revised: 08/23/2015 Document Reviewed: 04/19/2015 Elsevier Interactive Patient Education 2016 Elsevier Inc.  Chest Wall Pain Chest wall pain is pain in or around the bones and muscles of your chest. Sometimes, an injury causes this pain. Sometimes, the cause may not be known. This pain may take several weeks or longer to get better. HOME CARE Pay attention to any changes in your symptoms.  Take these actions to help with your pain:  Rest as told by your doctor.  Avoid activities that cause pain. Try not to use your chest, belly (abdominal), or side muscles to lift heavy things.  If directed, apply ice to the painful area:  Put ice in a plastic bag.  Place a towel between your skin and the bag.  Leave the ice on for 20 minutes, 2-3 times per day.  Take over-the-counter and prescription medicines only as told by your doctor.  Do not use tobacco products, including cigarettes, chewing tobacco, and e-cigarettes. If you need help quitting, ask your doctor.  Keep all follow-up visits as told by your doctor. This is important. GET HELP IF:  You have a fever.  Your chest pain gets worse.  You have new symptoms. GET HELP RIGHT AWAY IF:  You feel sick to your stomach (nauseous) or you throw up (vomit).  You feel sweaty or light-headed.  You have a cough with phlegm (sputum) or you cough up blood.  You are short of breath.   This information is not intended to replace advice given to you by your health care provider. Make sure you discuss any questions you have with your health care provider.   Document Released: 05/20/2008 Document Revised: 08/23/2015 Document Reviewed: 02/27/2015 Elsevier Interactive Patient Education 2016 Elsevier Inc.  Cryotherapy Cryotherapy is when you put ice on your injury. Ice helps lessen pain  and puffiness (swelling) after an injury. Ice works the best when you start using it in the first 24 to 48 hours after an injury. HOME CARE  Put a dry or damp towel between the ice pack and your skin.  You may press gently on the ice pack.  Leave the ice on for no more than 10 to 20 minutes at a time.  Check your skin after 5 minutes to make sure your skin is okay.  Rest at least 20 minutes between ice pack uses.  Stop using ice when your skin loses feeling (numbness).  Do not use ice on someone who cannot tell you when it hurts. This  includes small children and people with memory problems (dementia). GET HELP RIGHT AWAY IF:  You have white spots on your skin.  Your skin turns blue or pale.  Your skin feels waxy or hard.  Your puffiness gets worse. MAKE SURE YOU:   Understand these instructions.  Will watch your condition.  Will get help right away if you are not doing well or get worse.   This information is not intended to replace advice given to you by your health care provider. Make sure you discuss any questions you have with your health care provider.   Document Released: 05/20/2008 Document Revised: 02/24/2012 Document Reviewed: 07/25/2011 Elsevier Interactive Patient Education 2016 Elsevier Inc.  Head Injury, Adult You have a head injury. Headaches and throwing up (vomiting) are common after a head injury. It should be easy to wake up from sleeping. Sometimes you must stay in the hospital. Most problems happen within the first 24 hours. Side effects may occur up to 7-10 days after the injury.  WHAT ARE THE TYPES OF HEAD INJURIES? Head injuries can be as minor as a bump. Some head injuries can be more severe. More severe head injuries include:  A jarring injury to the brain (concussion).  A bruise of the brain (contusion). This mean there is bleeding in the brain that can cause swelling.  A cracked skull (skull fracture).  Bleeding in the brain that collects, clots, and forms a bump (hematoma). WHEN SHOULD I GET HELP RIGHT AWAY?   You are confused or sleepy.  You cannot be woken up.  You feel sick to your stomach (nauseous) or keep throwing up (vomiting).  Your dizziness or unsteadiness is getting worse.  You have very bad, lasting headaches that are not helped by medicine. Take medicines only as told by your doctor.  You cannot use your arms or legs like normal.  You cannot walk.  You notice changes in the black spots in the center of the colored part of your eye (pupil).  You have  clear or bloody fluid coming from your nose or ears.  You have trouble seeing. During the next 24 hours after the injury, you must stay with someone who can watch you. This person should get help right away (call 911 in the [place] if you start to shake and are not able to control it (have seizures), you pass out, or you are unable to wake up. HOW CAN I PREVENT A HEAD INJURY IN THE FUTURE?  Wear seat belts.  Wear a helmet while bike riding and playing sports like football.  Stay away from dangerous activities around the house. WHEN CAN I RETURN TO NORMAL ACTIVITIES AND ATHLETICS? See your doctor before doing these activities. You should not do normal activities or play contact sports until 1 week after the following symptoms have  stopped:  Headache that does not go away.  Dizziness.  Poor attention.  Confusion.  Memory problems.  Sickness to your stomach or throwing up.  Tiredness.  Fussiness.  Bothered by bright lights or loud noises.  Anxiousness or depression.  Restless sleep. MAKE SURE YOU:   Understand these instructions.  Will watch your condition.  Will get help right away if you are not doing well or get worse.   This information is not intended to replace advice given to you by your health care provider. Make sure you discuss any questions you have with your health care provider.   Document Released: 11/14/2008 Document Revised: 12/23/2014 Document Reviewed: 08/09/2013 Elsevier Interactive Patient Education 2016 Elsevier Inc.  Jaw Contusion A jaw contusion is a deep bruise of the jaw. Contusions happen when an injury causes bleeding under the skin. The contusion may turn blue, purple, or yellow. Minor injuries will cause a painless bruise, but very bad contusions may be painful and swollen for a few weeks. HOME CARE Diet  Eat soft foods as told by your doctor. Soft foods include baby food, gelatin, oatmeal, ice cream, applesauce, bananas, eggs, pasta,  cottage cheese, soups, and yogurt.  Cut food into smaller pieces. This makes it easier to chew.  Avoid chewing gum or ice. General Instructions  If directed, apply ice to the injured area:  Put ice in a plastic bag.  Place a towel between your skin and the bag.  Leave the ice on for 20 minutes, 2-3 times per day.  Take over-the-counter and prescription medicines only as told by your doctor.  Avoid opening your mouth widely. This includes opening your mouth to eat big pieces of food or to yawn, scream, yell, or sing.  Keep all follow-up visits as told by your doctor. This is important. GET HELP IF:  Your pain is not helped with medicine.  Your symptoms do not get better with treatment or they get worse.  You have new symptoms. GET HELP RIGHT AWAY IF:  You have any new cracking or clicking in your jaw.  You have trouble eating or you cannot eat.   This information is not intended to replace advice given to you by your health care provider. Make sure you discuss any questions you have with your health care provider.   Document Released: 11/21/2011 Document Revised: 08/23/2015 Document Reviewed: 02/27/2015 Elsevier Interactive Patient Education 2016 Elsevier Inc.    [employer] Therapy Heat therapy can help ease sore, stiff, injured, and tight muscles and joints. Heat relaxes your muscles, which may help ease your pain. Heat therapy should only be used on old, pre-existing, or long-lasting (chronic) injuries. Do not use heat therapy unless told by your doctor. HOW TO USE HEAT THERAPY There are several different kinds of heat therapy, including:  Moist heat pack.  Warm water bath.  Hot water bottle.  Electric heating pad.  Heated gel pack.  Heated wrap.  Electric heating pad. GENERAL HEAT THERAPY RECOMMENDATIONS   Do not sleep while using heat therapy. Only use heat therapy while you are awake.  Your skin may turn pink while using heat therapy. Do not use heat  therapy if your skin turns red.  Do not use heat therapy if you have new pain.  High heat or long exposure to heat can cause burns. Be careful when using heat therapy to avoid burning your skin.  Do not use heat therapy on areas of your skin that are already irritated, such as with a  rash or sunburn. GET HELP IF:   You have blisters, redness, swelling (puffiness), or numbness.  You have new pain.  Your pain is worse. MAKE SURE YOU:  Understand these instructions.  Will watch your condition.  Will get help right away if you are not doing well or get worse.   This information is not intended to replace advice given to you by your health care provider. Make sure you discuss any questions you have with your health care provider.   Document Released: 02/24/2012 Document Revised: 12/23/2014 Document Reviewed: 01/25/2014 Elsevier Interactive Patient Education [employer] Elsevier Inc.
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
## 2016-04-15 NOTE — ED Notes (Signed)
Dr Lynelle Doctorknapp in to assess. Pt reports that she has not used IV drugs in 3 weeks -

## 2016-04-15 NOTE — ED Notes (Signed)
Pt in radiology 

## 2016-05-09 ENCOUNTER — Encounter (HOSPITAL_COMMUNITY): Payer: Self-pay | Admitting: Emergency Medicine

## 2016-05-09 ENCOUNTER — Emergency Department (HOSPITAL_COMMUNITY)
Admission: EM | Admit: 2016-05-09 | Discharge: 2016-05-11 | Disposition: A | Discharge status: Other Acute Inpatient Hospital | Payer: Medicaid Other | Attending: Emergency Medicine | Admitting: Emergency Medicine

## 2016-05-09 DIAGNOSIS — F24 Shared psychotic disorder: Secondary | ICD-10-CM | POA: Insufficient documentation

## 2016-05-09 DIAGNOSIS — F17210 Nicotine dependence, cigarettes, uncomplicated: Secondary | ICD-10-CM | POA: Insufficient documentation

## 2016-05-09 DIAGNOSIS — F19959 Other psychoactive substance use, unspecified with psychoactive substance-induced psychotic disorder, unspecified: Secondary | ICD-10-CM

## 2016-05-09 LAB — CBC
HCT: 39.4 % (ref 36.0–46.0)
HEMOGLOBIN: 13 g/dL (ref 12.0–15.0)
MCH: 25.8 pg — AB (ref 26.0–34.0)
MCHC: 33 g/dL (ref 30.0–36.0)
MCV: 78.3 fL (ref 78.0–100.0)
PLATELETS: 463 10*3/uL — AB (ref 150–400)
RBC: 5.03 MIL/uL (ref 3.87–5.11)
RDW: 14 % (ref 11.5–15.5)
WBC: 10.2 10*3/uL (ref 4.0–10.5)

## 2016-05-09 LAB — COMPREHENSIVE METABOLIC PANEL
ALBUMIN: 4.3 g/dL (ref 3.5–5.0)
ALT: 11 U/L — ABNORMAL LOW (ref 14–54)
ANION GAP: 9 (ref 5–15)
AST: 14 U/L — ABNORMAL LOW (ref 15–41)
Alkaline Phosphatase: 80 U/L (ref 38–126)
BUN: 18 mg/dL (ref 6–20)
CALCIUM: 9.6 mg/dL (ref 8.9–10.3)
CO2: 22 mmol/L (ref 22–32)
Chloride: 104 mmol/L (ref 101–111)
Creatinine, Ser: 0.66 mg/dL (ref 0.44–1.00)
GFR calc non Af Amer: >60 mL/min (ref >60)
GLUCOSE: 104 mg/dL — AB (ref 65–99)
POTASSIUM: 3.8 mmol/L (ref 3.5–5.1)
SODIUM: 135 mmol/L (ref 135–145)
Total Bilirubin: 0.5 mg/dL (ref 0.3–1.2)
Total Protein: 8.7 g/dL — ABNORMAL HIGH (ref 6.5–8.1)

## 2016-05-09 LAB — SALICYLATE LEVEL

## 2016-05-09 LAB — ETHANOL: Alcohol, Ethyl (B): <5 mg/dL (ref <5)

## 2016-05-09 LAB — I-STAT BETA HCG BLOOD, ED (MC, WL, AP ONLY)

## 2016-05-09 LAB — ACETAMINOPHEN LEVEL

## 2016-05-09 MED ORDER — LORAZEPAM 2 MG/ML IJ SOLN
1.0000 mg | Freq: Once | INTRAMUSCULAR | Status: AC
  Administered 2016-05-09: 1 mg via INTRAVENOUS
  Filled 2016-05-09: qty 1

## 2016-05-09 MED ORDER — LORAZEPAM 1 MG PO TABS
1.0000 mg | ORAL_TABLET | ORAL | Status: DC | PRN
  Administered 2016-05-10 – 2016-05-11 (×2): 1 mg via ORAL
  Filled 2016-05-09 (×2): qty 1

## 2016-05-09 MED ORDER — LORAZEPAM 2 MG/ML IJ SOLN
2.0000 mg | Freq: Once | INTRAMUSCULAR | Status: AC
  Administered 2016-05-09: 2 mg via INTRAVENOUS
  Filled 2016-05-09: qty 1

## 2016-05-09 MED ORDER — LORAZEPAM 1 MG PO TABS
1.0000 mg | ORAL_TABLET | Freq: Once | ORAL | Status: AC
  Administered 2016-05-09: 1 mg via ORAL
  Filled 2016-05-09: qty 1

## 2016-05-09 NOTE — ED Provider Notes (Signed)
CSN: [id number]     Arrival date & time 05/09/16  1941 History   First MD Initiated Contact with Patient 05/09/16 1950     Chief Complaint  Patient presents with  . Addiction Problem   LEVEL 5 CAVEAT DUE TO ALTERED MENTAL STATUS  Patient is a 30 y.o. female presenting with altered mental status. The history is provided by the EMS personnel. The history is limited by the condition of the patient.  Altered Mental Status Presenting symptoms: confusion and disorientation   Severity:  Severe Duration:  1 week Timing:  Constant Progression:  Worsening Context: drug use   Patient presents via EMS for altered mental status likely due to drug abuse Per EMS, family reported pt has been using "meth" for a week and has had little sleep and little to eat She has been confused and speaking nonsensical No other details are known at this time  Past Medical History  Diagnosis Date  . Chronic shoulder pain   . Anxiety   . Hepatitis C   . Polysubstance abuse   . Abnormal Pap smear of vagina   . Drug use    Past Surgical History  Procedure Laterality Date  . Ankle surgery     Family History  Problem Relation Age of Onset  . Cirrhosis Father   . Hepatitis C Father    Social History  Substance Use Topics  . Smoking status: Current Every Day Smoker -- 1.00 packs/day    Types: Cigarettes  . Smokeless tobacco: Never Used  . Alcohol Use: Yes   OB History    No data available     Review of Systems  Unable to perform ROS: Mental status change  Psychiatric/Behavioral: Positive for confusion.      Allergies  Penicillins; Tylenol; Venlafaxine; and Morphine and related  Home Medications   Prior to Admission medications   Medication Sig Start Date End Date Taking? Authorizing Provider  cyclobenzaprine (FLEXERIL) 5 MG tablet Take 1 tablet (5 mg total) by mouth 3 (three) times daily as needed (muscle soreness). 04/15/16   Rolland Porter, MD  IRON PO Take 1 tablet by mouth daily.    Historical  Provider, MD  levonorgestrel (MIRENA) 20 MCG/24HR IUD 1 each by Intrauterine route once.    Historical Provider, MD  Multiple Vitamin (MULTIVITAMIN WITH MINERALS) TABS tablet Take 1 tablet by mouth daily.    Historical Provider, MD  Naloxone HCl 0.4 MG/0.4ML SOAJ Inject 1 application as directed once. Dispense 1 naloxone autoinjector kit 01/25/16   Delman Kitten, MD  naproxen (NAPROSYN) 500 MG tablet Take 1 po BID with food prn pain 04/15/16   Rolland Porter, MD  oxyCODONE (OXY IR/ROXICODONE) 5 MG immediate release tablet Take 1 tablet (5 mg total) by mouth every 4 (four) hours as needed for severe pain. 12/31/15   Noemi Chapel, MD  potassium chloride (K-DUR) 10 MEQ tablet Take 1 tablet (10 mEq total) by mouth daily. 01/25/16   Delman Kitten, MD  traMADol (ULTRAM) 50 MG tablet Take 1 tablet (50 mg total) by mouth every 6 (six) hours as needed. 01/12/16   Evalee Jefferson, PA-C   BP 123/93 mmHg  Pulse 115  Temp(Src) 98.5 F (36.9 C) (Oral)  Resp 22  Wt 58.968 kg  SpO2 100% Physical Exam CONSTITUTIONAL: Disheveled, anxious HEAD: Normocephalic/atraumatic EYES: EOMI/PERRL, pupils dilated bilaterally ENMT: Mucous membranes dry, poor dentition NECK: supple no meningeal signs SPINE/BACK:entire spine nontender CV: S1/S2 noted, no murmurs/rubs/gallops noted LUNGS: Lungs are clear to auscultation bilaterally  ABDOMEN: soft, nontender NEURO: Pt is awake/alert.  She appears anxious, she appears disoriented.  She is rambling nonsensically and does not answer questions appropriately.  She moves all extremities EXTREMITIES: pulses normal/equal, full ROM, healing scab to right hand, no fluctuance noted SKIN: warm, color normal PSYCH: anxious  ED Course  Procedures  Medications  LORazepam (ATIVAN) tablet 1 mg (1 mg Oral Given 05/09/16 2013)  LORazepam (ATIVAN) injection 2 mg (2 mg Intravenous Given 05/09/16 2059)  LORazepam (ATIVAN) injection 1 mg (1 mg Intravenous Given 05/09/16 2133)    Labs Review Labs Reviewed   COMPREHENSIVE METABOLIC PANEL - Abnormal; Notable for the following:    Glucose, Bld 104 (*)    Total Protein 8.7 (*)    AST 14 (*)    ALT 11 (*)    All other components within normal limits  CBC - Abnormal; Notable for the following:    MCH 25.8 (*)    Platelets 463 (*)    All other components within normal limits  ACETAMINOPHEN LEVEL - Abnormal; Notable for the following:    Acetaminophen (Tylenol), Serum <10 (*)    All other components within normal limits  ETHANOL  SALICYLATE LEVEL  URINE RAPID DRUG SCREEN, HOSP PERFORMED  I-STAT BETA HCG BLOOD, ED ([facility], [facility], [facility] ONLY)    I have personally reviewed and evaluated these lab results as part of my medical decision-making.   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 8:49 PM Mother now present She reports she has not seen patient in a week She reports patient usually abuses opana, suboxone, etc but has also known to use meth She reports she went up to [place] and probably using drugs there 9:37 PM Labs reassuring Pt still agitated and requiring ativan Given lack of sleep and use of amphetamines, suspect this is substance induced Will need monitoring overnight and psych consult in the morning but at this time not a candidate for psych evaluation   MDM   Final diagnoses:  Substance or medication-induced psychotic disorder, with unspecified complication [facility])    Nursing notes including past medical history and social history reviewed and considered in documentation Labs/vital reviewed myself and  considered during evaluation     Ripley Fraise, MD 05/09/16 2140

## 2016-05-09 NOTE — ED Notes (Signed)
Pt has been using meth for a week straight with little sleep and not eating per family.  Pt rambling, not making sense, unable to appropriately answer questions.  Pt used meth just prior to EMS arrival per EMS.

## 2016-05-09 NOTE — ED Notes (Signed)
Pt hallucinating that she is holding a baby deer.  Having conversations with persons/things not present

## 2016-05-09 NOTE — ED Notes (Signed)
Pt attempting to get out of bed and yelling incoherently.  Spoke with Rancour for medication orders.

## 2016-05-09 NOTE — ED Notes (Signed)
Patient is very hyper at this time. Patient having conversation with someone who is not in the room. Patient is not having any behavior that is harmful to self or others.

## 2016-05-10 LAB — RAPID URINE DRUG SCREEN, HOSP PERFORMED
Amphetamines: POSITIVE — AB
BARBITURATES: NOT DETECTED
Benzodiazepines: POSITIVE — AB
Cocaine: POSITIVE — AB
Opiates: POSITIVE — AB
Tetrahydrocannabinol: POSITIVE — AB

## 2016-05-10 MED ORDER — NICOTINE 21 MG/24HR TD PT24
21.0000 mg | MEDICATED_PATCH | Freq: Every day | TRANSDERMAL | Status: DC
  Administered 2016-05-10 – 2016-05-11 (×2): 21 mg via TRANSDERMAL
  Filled 2016-05-10 (×2): qty 1

## 2016-05-10 NOTE — ED Notes (Signed)
Heidi [name] (mother):  Cell : [phone]

## 2016-05-10 NOTE — ED Provider Notes (Signed)
Phone consult from Behavioral Health: Patient will likely stay Friday night and be reevaluated on Saturday morning secondary to persistent altered mental status.  Donnetta HutchingBrian Maxine Fredman, MD 05/10/16 [phone]

## 2016-05-10 NOTE — ED Notes (Addendum)
Per security: Patient's boyfriend was wanded before coming into room. Patient stated he had a cigarette lighter in his pocket and wanted to know if he could throw it away. Security saw something large and black flash when boyfriend threw "lighter" away. When Security checked the garbage can they found and 7 inch folding knife.

## 2016-05-10 NOTE — ED Notes (Signed)
Sitter advises pt waking up & rambling w/ no one in the room. Pt starting to become more agitated. PRN med order given.

## 2016-05-10 NOTE — ED Notes (Signed)
Patient talking non coherent sentences. Patient restless at this time.
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
## 2016-05-10 NOTE — BH Assessment (Addendum)
Tele Assessment Note   Heidi [name] is an 30 y.o. female who was brought to the ED by EMS and is under IVC due to substance induced psychosis and not being oriented x4. During assessment she was able to say that she was in the hospital but was not able to answer many questions to much detail. She was hyperactive in movement and was talking to people that weren't in the room and looking around as if she saw things that weren't there. Pt UDS resulted and she was positive for Opiates, Cocaine, Benzos, Amphetimines and THC. Pt only disclosed that she was using crystal meth and heroin but could not give an accurate time of when she last used. When clinician asked her she said "I used 3 days before my daughter was born". When clinician asked when her daughter was born she stated "she's 12". Per records pt has been to the ED 3-4 times in the past 2 years requesting detox and help with addiction issues. It does not appear that she has been inpatient for psychiatric conditions and has not been suicidal or homicidal in the past. No history of psychosis before this encounter as well. Pt denies SI, HI at present. Per RN mom stated that pt has been using crystal meth for 2 weeks straight and has not been eating. She states that she believes she might have lost 15-20lbs in this time frame. Pt appears to be malnurished and looks like she has poor hygiene. She states that she lives with her mom but clinician is not able to confirm this. Clinician attempted to call mom for collateral info. But the phone went straight to VM- a message was left. Previous records state that she is positive for hep C.   Spoke to Hillery Jacksanika Lewis NP who recommended pt be observed overnight and reevaluated in the morning by psychiatry when she has hopefully cleared up enough to give more information. Clinician will continue to try and call mom to get more information about pt.   Diagnosis: Substance induced psychosis   Past Medical History:  Past  Medical History  Diagnosis Date  . Chronic shoulder pain   . Anxiety   . Hepatitis C   . Polysubstance abuse   . Abnormal Pap smear of vagina   . Drug use     Past Surgical History  Procedure Laterality Date  . Ankle surgery      Family History:  Family History  Problem Relation Age of Onset  . Cirrhosis Father   . Hepatitis C Father     Social History:  reports that she has been smoking Cigarettes.  She has been smoking about 1.00 pack per day. She has never used smokeless tobacco. She reports that she drinks alcohol. She reports that she uses illicit drugs (Heroin, Marijuana, and Methamphetamines).  Additional Social History:  Alcohol / Drug Use History of alcohol / drug use?: Yes Substance #1 Name of Substance 1: Crystal Meth 1 - Age of First Use: unknown  1 - Amount (size/oz): unspecified  1 - Frequency: daily  1 - Duration: unknown 1 - Last Use / Amount: yesterday  Substance #2 Name of Substance 2: Opiates/Heroin 2 - Age of First Use: Unknown 2 - Amount (size/oz): unspecified  2 - Frequency: unknown 2 - Duration: unknown 2 - Last Use / Amount: unknown  CIWA: CIWA-Ar BP: 103/75 mmHg Pulse Rate: 102 COWS:    PATIENT STRENGTHS: (choose at least two) Average or above average intelligence Supportive family/friends  Allergies:  Allergies  Allergen Reactions  . Penicillins Anaphylaxis, Hives and Other (See Comments)    Has patient had a PCN reaction causing immediate rash, facial/tongue/throat swelling, SOB or lightheadedness with hypotension: Yes Has patient had a PCN reaction causing severe rash involving mucus membranes or skin necrosis: No Has patient had a PCN reaction that required hospitalization No Has patient had a PCN reaction occurring within the last 10 years: No If all of the above answers are "NO", then may proceed with Cephalosporin use.  headaches  . Tylenol [Acetaminophen] Hives  . Venlafaxine Hives and Other (See Comments)     Headaches    . Morphine And Related Hives and Other (See Comments)    Reaction: headaches    Home Medications:  (Not in a hospital admission)  OB/GYN Status:  No LMP recorded. Patient is not currently having periods (Reason: IUD).  General Assessment Data Location of Assessment: [facility] TTS Assessment: In system Is this a Tele or Face-to-Face Assessment?: Tele Assessment Is this an Initial Assessment or a Re-assessment for this encounter?: Initial Assessment Marital status: Single Is patient pregnant?: No Pregnancy Status: No Living Arrangements: Parent Can pt return to current living arrangement?: Yes Admission Status: Involuntary Is patient capable of signing voluntary admission?: No Referral Source: Self/Family/Friend Insurance type:  (None)     Crisis Care Plan Living Arrangements: Parent Legal Guardian:  Heidi [name]- Mom [phone])  Education Status Is patient currently in school?: No Highest grade of school patient has completed: 12  Risk to self with the past 6 months Suicidal Ideation: No Has patient been a risk to self within the past 6 months prior to admission? : No Suicidal Intent: No Has patient had any suicidal intent within the past 6 months prior to admission? : No Is patient at risk for suicide?: No Suicidal Plan?: No Has patient had any suicidal plan within the past 6 months prior to admission? : No Access to Means: No What has been your use of drugs/alcohol within the last 12 months?: Chronic drug use  Previous Attempts/Gestures: No How many times?: 0 Triggers for Past Attempts:  (substance induced psychosis) Intentional Self Injurious Behavior: None Family Suicide History: No Recent stressful life event(s): Other (Comment) (Substance induced psychosis- hasn't eaten in 2 weeks) Persecutory voices/beliefs?:  (yes) Depression: Yes Depression Symptoms: Despondent, Feeling worthless/self pity Substance abuse history and/or treatment for  substance abuse?: Yes Suicide prevention information given to non-admitted patients: Not applicable  Risk to Others within the past 6 months Homicidal Ideation: No Does patient have any lifetime risk of violence toward others beyond the six months prior to admission? : No Thoughts of Harm to Others: No Current Homicidal Intent: No Current Homicidal Plan: No Access to Homicidal Means: No Identified Victim: none History of harm to others?: No Assessment of Violence: None Noted Violent Behavior Description: none Does patient have access to weapons?: No Criminal Charges Pending?: No Does patient have a court date: No Is patient on probation?: No  Psychosis Hallucinations: Auditory, Visual (seeing a "baby deer" and her mother in the room) Delusions: Grandiose  Mental Status Report Appearance/Hygiene: Bizarre Eye Contact: Poor Motor Activity: Hyperactivity Speech: Incoherent, Soft Level of Consciousness: Other (Comment) (confused) Mood: Suspicious Affect: Frightened Anxiety Level: Severe Thought Processes: Flight of Ideas Judgement: Impaired Orientation: Not oriented Obsessive Compulsive Thoughts/Behaviors: Unable to Assess  Cognitive Functioning Concentration: Decreased Memory: Recent Impaired, Remote Impaired IQ: Average Insight: Poor Impulse Control: Poor Appetite: Poor Weight Loss: 20 Weight Gain: 0 Sleep: Decreased Total  Hours of Sleep:  (unknown- not sleeping) Vegetative Symptoms: None  ADLScreening [facility] Assessment Services) Patient's cognitive ability adequate to safely complete daily activities?: Yes Patient able to express need for assistance with ADLs?: Yes Independently performs ADLs?: Yes (appropriate for developmental age)  Prior Inpatient Therapy Prior Inpatient Therapy: Yes Prior Therapy Dates: unknown Reason for Treatment: SA  Prior Outpatient Therapy Prior Outpatient Therapy:  (UTA) Does patient have an ACCT team?: No Does patient have  Intensive In-House Services?  : No Does patient have [facility] services? : No Does patient have P4CC services?: No  ADL Screening (condition at time of admission) Patient's cognitive ability adequate to safely complete daily activities?: Yes Is the patient deaf or have difficulty hearing?: No Does the patient have difficulty seeing, even when wearing glasses/contacts?: No Does the patient have difficulty concentrating, remembering, or making decisions?: Yes (currently becaue of psychosis) Patient able to express need for assistance with ADLs?: Yes Does the patient have difficulty dressing or bathing?: No Independently performs ADLs?: Yes (appropriate for developmental age) Does the patient have difficulty walking or climbing stairs?: No Weakness of Legs: None Weakness of Arms/Hands: None  Home Assistive Devices/Equipment Home Assistive Devices/Equipment: None  Therapy Consults (therapy consults require a physician order) PT Evaluation Needed: No OT Evalulation Needed: No SLP Evaluation Needed: No Abuse/Neglect Assessment (Assessment to be complete while patient is alone) Physical Abuse: Denies Verbal Abuse: Denies Sexual Abuse: Denies Exploitation of patient/patient's resources: Denies Self-Neglect: Denies Values / Beliefs Cultural Requests During Hospitalization: None Spiritual Requests During Hospitalization: None Consults Spiritual Care Consult Needed: No Social Work Consult Needed: No Merchant navy officer (For Healthcare) Does patient have an advance directive?: No Would patient like information on creating an advanced directive?: No - patient declined information Nutrition Screen- [facility] Patient's home diet: Regular Has the patient recently lost weight without trying?: Yes, 14-23 lbs. Has the patient been eating poorly because of a decreased appetite?: Yes Malnutrition Screening Tool Score: 3  Additional Information 1:1 In Past 12 Months?: No CIRT Risk:  No Elopement Risk: Yes Does patient have medical clearance?: No     Disposition:  Disposition Initial Assessment Completed for this Encounter: Yes Disposition of Patient: Other dispositions Other disposition(s):  (AM telepsych for final disposition)  Veda Arrellano 05/10/2016 9:44 AM

## 2016-05-11 ENCOUNTER — Inpatient Hospital Stay (HOSPITAL_COMMUNITY)
Admission: AD | Admit: 2016-05-11 | Discharge: 2016-05-15 | DRG: 897 | Disposition: A | Discharge status: Home / Self Care | Payer: No Typology Code available for payment source | Source: Intra-hospital | Attending: Psychiatry | Admitting: Psychiatry

## 2016-05-11 DIAGNOSIS — F329 Major depressive disorder, single episode, unspecified: Secondary | ICD-10-CM | POA: Diagnosis present

## 2016-05-11 DIAGNOSIS — F1994 Other psychoactive substance use, unspecified with psychoactive substance-induced mood disorder: Principal | ICD-10-CM | POA: Diagnosis present

## 2016-05-11 DIAGNOSIS — F17210 Nicotine dependence, cigarettes, uncomplicated: Secondary | ICD-10-CM | POA: Diagnosis present

## 2016-05-11 DIAGNOSIS — F411 Generalized anxiety disorder: Secondary | ICD-10-CM | POA: Diagnosis present

## 2016-05-11 DIAGNOSIS — F1120 Opioid dependence, uncomplicated: Secondary | ICD-10-CM | POA: Diagnosis not present

## 2016-05-11 DIAGNOSIS — F39 Unspecified mood [affective] disorder: Secondary | ICD-10-CM | POA: Diagnosis present

## 2016-05-11 DIAGNOSIS — F1520 Other stimulant dependence, uncomplicated: Secondary | ICD-10-CM | POA: Diagnosis not present

## 2016-05-11 MED ORDER — IBUPROFEN 100 MG/5ML PO SUSP: 400.0000 mg | Freq: Once | ORAL | Status: DC

## 2016-05-11 MED ORDER — IBUPROFEN 400 MG PO TABS: 400.0000 mg | ORAL_TABLET | Freq: Once | ORAL | Status: DC

## 2016-05-11 MED ORDER — IBUPROFEN 400 MG PO TABS
ORAL_TABLET | ORAL | Status: AC
  Administered 2016-05-11: 400 mg via ORAL
  Filled 2016-05-11: qty 1

## 2016-05-11 MED ORDER — IBUPROFEN 400 MG PO TABS
400.0000 mg | ORAL_TABLET | Freq: Once | ORAL | Status: AC
  Administered 2016-05-11: 400 mg via ORAL

## 2016-05-11 NOTE — ED Notes (Signed)
Meal given

## 2016-05-11 NOTE — Progress Notes (Signed)
Disposition CSW completed patient referrals to the following inpatient psych facilities:  [facility]  CSW will continue to follow patient for placement needs.  Seward SpeckLeo Jaiden Dinkins [facility]SW,LCAS Behavioral Health Disposition CSW [phone]

## 2016-05-11 NOTE — ED Notes (Signed)
Telepsych in progress. 

## 2016-05-11 NOTE — ED Notes (Signed)
Pt c/o tooth ache, dr. Deretha EmoryZackowski notified and gave verbal order for 400 mg ibuprofen.

## 2016-05-11 NOTE — ED Notes (Signed)
Report to Emiliano DyerLiz, [facility]

## 2016-05-11 NOTE — ED Notes (Signed)
Pt given phone to call father. 

## 2016-05-11 NOTE — ED Notes (Signed)
Pt took shower, given new scrubs and socks.

## 2016-05-11 NOTE — ED Notes (Signed)
Pt given peanut butter and crackers  

## 2016-05-11 NOTE — ED Notes (Signed)
Pt offered shower, pt denied at this time.

## 2016-05-11 NOTE — ED Notes (Signed)
Mother called ED to check on pt status, pt gave permission for me to speak to mother regarding care.

## 2016-05-11 NOTE — ED Notes (Signed)
Behavioral health has accepted pt to [facility]  Accepting doctor: Dr. Jama Flavorsobos [phone].

## 2016-05-11 NOTE — ED Notes (Signed)
Discharged with [employer] [employer]

## 2016-05-11 NOTE — ED Notes (Signed)
Parents at bedside, updated pt on status.

## 2016-05-11 NOTE — BHH Counselor (Signed)
BHH Assessment Progress Note  [facility] Accepting doctor is Cobos. Pt can come anytime. Pt is IVC. Call report to [phone]. Pt's RN, Meagan, notified.   Johny ShockSamantha M. Ladona Ridgelaylor, MS, NCC, LPCA Counselor

## 2016-05-11 NOTE — ED Provider Notes (Signed)
[facility] Will [facility].   Heidi JesterKathleen Altair Stanko, DO 05/11/16 [phone]

## 2016-05-12 ENCOUNTER — Encounter (HOSPITAL_COMMUNITY): Payer: Self-pay | Admitting: *Deleted

## 2016-05-12 DIAGNOSIS — F1994 Other psychoactive substance use, unspecified with psychoactive substance-induced mood disorder: Secondary | ICD-10-CM | POA: Diagnosis present

## 2016-05-12 MED ORDER — CLONIDINE HCL 0.1 MG PO TABS
0.1000 mg | ORAL_TABLET | ORAL | Status: DC
  Filled 2016-05-12 (×4): qty 1

## 2016-05-12 MED ORDER — ONDANSETRON 4 MG PO TBDP: 4.0000 mg | ORAL_TABLET | Freq: Four times a day (QID) | ORAL | Status: DC | PRN

## 2016-05-12 MED ORDER — HYDROXYZINE HCL 25 MG PO TABS
25.0000 mg | ORAL_TABLET | Freq: Four times a day (QID) | ORAL | Status: DC | PRN
  Administered 2016-05-12 – 2016-05-15 (×7): 25 mg via ORAL
  Filled 2016-05-12: qty 10
  Filled 2016-05-12 (×6): qty 1

## 2016-05-12 MED ORDER — METHOCARBAMOL 500 MG PO TABS
ORAL_TABLET | ORAL | Status: AC
  Filled 2016-05-12: qty 1

## 2016-05-12 MED ORDER — CLONIDINE HCL 0.1 MG PO TABS
0.1000 mg | ORAL_TABLET | Freq: Four times a day (QID) | ORAL | Status: AC
  Administered 2016-05-12 – 2016-05-14 (×7): 0.1 mg via ORAL
  Filled 2016-05-12 (×10): qty 1

## 2016-05-12 MED ORDER — METHOCARBAMOL 500 MG PO TABS
500.0000 mg | ORAL_TABLET | Freq: Three times a day (TID) | ORAL | Status: DC | PRN
  Administered 2016-05-12: 500 mg via ORAL

## 2016-05-12 MED ORDER — DICYCLOMINE HCL 20 MG PO TABS: 20.0000 mg | ORAL_TABLET | Freq: Four times a day (QID) | ORAL | Status: DC | PRN

## 2016-05-12 MED ORDER — HYDROXYZINE HCL 25 MG PO TABS
ORAL_TABLET | ORAL | Status: AC
  Filled 2016-05-12: qty 1

## 2016-05-12 MED ORDER — NAPROXEN 500 MG PO TABS
500.0000 mg | ORAL_TABLET | Freq: Two times a day (BID) | ORAL | Status: DC | PRN
  Administered 2016-05-12 – 2016-05-15 (×3): 500 mg via ORAL
  Filled 2016-05-12 (×3): qty 1

## 2016-05-12 MED ORDER — LOPERAMIDE HCL 2 MG PO CAPS: 2.0000 mg | ORAL_CAPSULE | ORAL | Status: DC | PRN

## 2016-05-12 MED ORDER — MIRTAZAPINE 15 MG PO TABS
15.0000 mg | ORAL_TABLET | Freq: Every day | ORAL | Status: DC
  Administered 2016-05-12 – 2016-05-14 (×3): 15 mg via ORAL
  Filled 2016-05-12 (×4): qty 1

## 2016-05-12 MED ORDER — CLONIDINE HCL 0.1 MG PO TABS: 0.1000 mg | ORAL_TABLET | Freq: Every day | ORAL | Status: DC

## 2016-05-12 NOTE — BHH Group Notes (Signed)
BHH Group Notes: (Clinical Social Work)   05/12/2016      Type of Therapy:  Group Therapy   Participation Level:  Did Not Attend despite MHT prompting   Nelva Hauk Grossman-Orr, LCSW 05/12/2016, 11:40 AM     

## 2016-05-12 NOTE — [facility] Suicide Risk Assessment (Addendum)
[facility]   Nursing information obtained from:   chart and patient Demographic factors:   30 year old separated female, two children who live with their grandmother Current Mental Status:   see below  Loss Factors:   not having custody of her children Historical Factors:   opiate dependence ,  More recent amphetamine dependence, denies history of suicide attempts  Risk Reduction Factors:   sense of responsibility for children, family   Total Time spent with patient: 45 minutes Principal Problem:  Opiate Dependence, Stimulant Dependence , Opiate WDL  Diagnosis:   Patient Active Problem List   Diagnosis Date Noted  . Substance induced mood disorder (HCC) [F19.94] 05/12/2016  . Elevated LFTs [R79.89] 06/14/2014  . Hepatitis C, acute [B17.10] 06/05/2014  . Polysubstance abuse [F19.10] 06/04/2014  . Acute hepatitis [K72.00] 06/03/2014  . Cholestatic jaundice [R17] 06/03/2014  . Cystitis, acute [N30.00] 06/03/2014  . Chronic pain syndrome [G89.4] 06/03/2014  . Hypokalemia [E87.6] 06/03/2014  . CLOSED FRACTURE OF MEDIAL MALLEOLUS [S82.53XA] 06/20/2010  . ANKLE SPRAIN [S93.409A] 01/07/2008     Continued Clinical Symptoms:  Alcohol Use Disorder Identification Test Final Score (AUDIT): 1 The "Alcohol Use Disorders Identification Test", Guidelines for Use in Primary Care, Second Edition.  World Science writerHealth Organization [facility](WHO). Score between 0-7:  no or low risk or alcohol related problems. Score between 8-15:  moderate risk of alcohol related problems. Score between 16-19:  high risk of alcohol related problems. Score 20 or above:  warrants further diagnostic evaluation for alcohol dependence and treatment.   CLINICAL FACTORS:  30 year old married ( separated ) female, currently living with mother. Has two children, who live with her mother in law. She reports history of substance dependence. Identifies narcotics /opiates as substance of choice, but states that over  recent days she had been using methamphetamine mostly, and states " I stayed up for days ". She states she had not been eating or taking proper care of herself. She also started experiencing visual hallucinations, and came to ED. As per notes, on admission to ED presented rambling, incoherent . Patient reports history of depression, but no suicide attempts or history of self cutting . Long history of substance abuse .  At this time patient presents fully alert, attentive, not confused or agitated. She does report symptoms of opiate withdrawal- reports cramps, feeling chills, nausea, aches .  Dx- Opiate Dependence, Stimulant Dependence, Substance Induced Psychosis ( resolving ) , Opiate WDL Plan- opiate detox protocol with clonidine.  Patient interested in antidepressant - due to recent poor sleep and significant weight loss , we agreed on REMERON trial. Side effects discussed. Start Remeron 15 mgrs QHS.   Musculoskeletal: Strength & Muscle Tone: within normal limits- no tremors , no diaphoresis, no restlessness  Gait & Station: normal Patient leans: [name]  Psychiatric Specialty Exam: Physical Exam  ROS (+) headache, no visual abnormalities, no chest pain, no shortness of breath, (+) nausea, no vomiting, no diarrhea, (+) cramping and muscular aches   Blood pressure 120/65, pulse 99, temperature 98.4 F (36.9 C), temperature source Oral, resp. rate 16, height 5\' 5"  (1.651 m), weight 114 lb (51.71 kg).Body mass index is 18.97 kg/(m^2).  General Appearance: fairly groomed   Eye Contact:  Good  Speech:  Normal Rate  Volume:  Decreased  Mood:  states " a little better "  Affect:  mildly constricted, vaguely irritable  Thought Process:  Linear  Orientation:  Other:  fully alert and attentive  Thought Content:  denies any ongoing hallucinations, and does not appear internally preoccupied at this time , no delusions expressed   Suicidal Thoughts:  No- denies any suicidal ideations,  Denies any  self injurious ideations, contracts for safety on the unit , denies any homicidal ideations , no homicidal ideations   Homicidal Thoughts:  No  Memory:  recent and remote grossly intact   Judgement:  Fair  Insight:  Fair  Psychomotor Activity:  Normal  Concentration:  Concentration: Good and Attention Span: Good  Recall:  Good  Fund of Knowledge:  Good  Language:  Good  Akathisia:  Negative  Handed:  Right  AIMS (if indicated):     Assets:  Desire for Improvement Resilience  ADL's:  Intact  Cognition:  WNL  Sleep:  Number of Hours: 4      COGNITIVE FEATURES THAT CONTRIBUTE TO RISK:  Closed-mindedness and Loss of executive function    SUICIDE RISK:   Mild:  Suicidal ideation of limited frequency, intensity, duration, and specificity.  There are no identifiable plans, no associated intent, mild dysphoria and related symptoms, good self-control (both objective and subjective assessment), few other risk factors, and identifiable protective factors, including available and accessible social support.  PLAN OF CARE: Patient will be admitted to inpatient psychiatric unit for stabilization and safety. Will provide and encourage milieu participation. Provide medication management and maked adjustments as needed.   Will also provide medications to minimize risk of withdrawal . Will follow daily.    I certify that inpatient services furnished can reasonably be expected to improve the patient's condition.   Nehemiah Massed, MD 05/12/2016, 10:48 AM
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
## 2016-05-12 NOTE — Tx Team (Signed)
Initial Interdisciplinary Treatment Plan   PATIENT STRESSORS: Medication change or noncompliance Substance abuse   PATIENT STRENGTHS: Ability for insight Active sense of humor Average or above average intelligence Capable of independent living General fund of knowledge   PROBLEM LIST: Problem List/Patient Goals Date to be addressed Date deferred Reason deferred Estimated date of resolution  Depression 05/11/16     Substance Abuse 05/11/16     "Work on my substance abuse" 05/11/16                                          DISCHARGE CRITERIA:  Ability to meet basic life and health needs Improved stabilization in mood, thinking, and/or behavior Verbal commitment to aftercare and medication compliance Withdrawal symptoms are absent or subacute and managed without 24-hour nursing intervention  PRELIMINARY DISCHARGE PLAN: Attend aftercare/continuing care group Return to previous living arrangement  PATIENT/FAMIILY INVOLVEMENT: This treatment plan has been presented to and reviewed with the patient, Loleta RoseJodi Riviera, and/or family member, .  The patient and family have been given the opportunity to ask questions and make suggestions.  Laprecious Austill, [place] 05/12/2016, 12:38 AM

## 2016-05-12 NOTE — Progress Notes (Signed)
D:Patient in the dayroom on approach.  Patient states she is feeling much better today.  Patient states this is the best she has felt since she has been here.  Patient has been increasing fluid intake today and she states she has been eating well.  Patient denies SI/HI and denies AVH. A: Staff to monitor Q 15 mins for safety.  Encouragement and support offered.  Scheduled medications administered per orders.   R: Patient remains safe on the unit.  Patient attended group tonight.  Patient visible on the unit.  Patient taking administered medications.

## 2016-05-12 NOTE — [facility] Group Notes (Signed)
The focus of this group is to educate the patient on the purpose and policies of crisis stabilization and provide a format to answer questions about their admission.  The group details unit policies and expectations of patients while admitted.  Patient did not attend 0900 nurse education orientation group this morning.  Patient stayed in bed.   

## 2016-05-12 NOTE — Progress Notes (Signed)
Patient did attend the evening speaker AA meeting.  

## 2016-05-12 NOTE — Plan of Care (Signed)
Problem: Education: Goal: Utilization of techniques to improve thought processes will improve Outcome: Progressing Nurse discussed depression/coping skills with patient.    
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
## 2016-05-12 NOTE — Progress Notes (Signed)
D:  Patient's self inventory sheet, patient has fair sleep, sleep medication is helpful.  Patient has good appetite, low energy level, poor concentration.  Rated depression and hopeless 3, anxiety 8.  Withdrawals, chilling, cravings, agitation, nausea, runny nose, irritability.  Denied SI.  Physical problems, lightheaded, pain, headaches.  Physical pain, worst pain #8 in past 24 hours, no pain medication.  Goal is to stay calm.  Plans to stay sober.  No discharge plans. A:  Medications administered per MD orders.  Emotional support and encouragement given patient. R:  Denied SI and Hi, contracts for safety.  Denied A/V hallucinations.  Denied pain this morning while talking to nurse, no pain medication needed. Safety maintained with 15 minute checks.

## 2016-05-13 MED ORDER — NICOTINE 21 MG/24HR TD PT24
21.0000 mg | MEDICATED_PATCH | Freq: Every day | TRANSDERMAL | Status: DC
  Administered 2016-05-13: 21 mg via TRANSDERMAL
  Filled 2016-05-13 (×4): qty 1

## 2016-05-13 MED ORDER — ENSURE ENLIVE PO LIQD
237.0000 mL | Freq: Two times a day (BID) | ORAL | Status: DC
  Administered 2016-05-13 – 2016-05-14 (×3): 237 mL via ORAL

## 2016-05-13 NOTE — Progress Notes (Signed)
D: Patient reported improved sleep, poor appetite, improved mood. Requested anti-anxiety medication and Ibuprofen for headache. Refused AM groups and stayed in room. In dayroom after lunch with minimal interaction. A: monitoring for withdrawal and medication side effects, medication education given, verbal encouragement. R: Denies SI/HI/AVH, contracts for safety. Reported relief from prn medications and clonidine. Withdrawal score is low and patient reports feeling better.

## 2016-05-13 NOTE — [facility] Group Notes (Signed)
[facility]   05/13/2016 9:31 AM  Participation Quality:  Invited. Chose to rest in bed. DID NOT ATTEND.   Smart, Alphia Behanna LCSW

## 2016-05-13 NOTE — Progress Notes (Signed)
NUTRITION ASSESSMENT  Pt identified as at risk on the Malnutrition Screen Tool  INTERVENTION: 1. Educated patient on the importance of nutrition and encouraged intake of food and beverages. 2. Discussed weight goals. 3. Supplements: will order Ensure Enlive po BID, each supplement provides 350 kcal and 20 grams of protein   NUTRITION DIAGNOSIS: Unintentional weight loss related to sub-optimal intake as evidenced by pt report.   Goal: Pt to meet >/= 90% of their estimated nutrition needs.  Monitor:  PO intake  Assessment:  Pt screened for MST. Pt with hx of polysubstance abuse and was not sleeping for 1 week PTA with associated hallucinations. Per review pt has lost weight recently. Based on weight hx in the chart, pt has lost 26 lbs (18.6% body weight) in the past 3.5 months which is significant for time frame. Will order Ensure Enlive BID; continue to encourage pt with meals and snacks.   30 y.o. female  Height: Ht Readings from Last 1 Encounters:  05/12/16 5\' 5"  (1.651 m)    Weight: Wt Readings from Last 1 Encounters:  05/12/16 114 lb (51.71 kg)    Weight Hx: Wt Readings from Last 10 Encounters:  05/12/16 114 lb (51.71 kg)  05/09/16 130 lb (58.968 kg)  04/15/16 130 lb (58.968 kg)  01/25/16 140 lb (63.504 kg)  01/12/16 140 lb (63.504 kg)  12/31/15 130 lb (58.968 kg)  10/12/15 140 lb (63.504 kg)  07/25/15 120 lb (54.432 kg)  05/10/15 135 lb (61.236 kg)  02/01/15 114 lb (51.71 kg)    BMI:  Body mass index is 18.97 kg/(m^2). Pt meets criteria for normal weight based on current BMI.  Estimated Nutritional Needs: Kcal: 25-30 kcal/kg Protein: > 1 gram protein/kg Fluid: 1 ml/kcal  Diet Order: Diet regular Room service appropriate?: Yes; Fluid consistency:: Thin Pt is also offered choice of unit snacks mid-morning and mid-afternoon.  Pt is eating as desired.   Lab results and medications reviewed.      Trenton GammonJessica Deanna Boehlke, RD, LDN Inpatient Clinical  Dietitian Pager # [phone] After hours/weekend pager # [phone]

## 2016-05-13 NOTE — H&P (Signed)
Psychiatric Admission Assessment Adult  Patient Identification: Heidi [name] MRN:  [id number] Date of Evaluation:  05/13/2016 Chief Complaint:  SUBSTANCE INDUCED PSYCHOSIS Principal Diagnosis: Substance induced mood disorder ([place]) Diagnosis:   Patient Active Problem List   Diagnosis Date Noted  . Substance induced mood disorder ([place]) [F19.94] 05/12/2016  . Elevated LFTs [R79.89] 06/14/2014  . Hepatitis C, acute [B17.10] 06/05/2014  . Polysubstance abuse [F19.10] 06/04/2014  . Acute hepatitis [K72.00] 06/03/2014  . Cholestatic jaundice [R17] 06/03/2014  . Cystitis, acute [N30.00] 06/03/2014  . Chronic pain syndrome [G89.4] 06/03/2014  . Hypokalemia [E87.6] 06/03/2014  . CLOSED FRACTURE OF MEDIAL MALLEOLUS [S82.53XA] 06/20/2010  . ANKLE SPRAIN [S93.409A] 01/07/2008   History of Present Illness: I have reviewed and concur with HPI elements below, modified as follows: 30 year old married ( separated ) female, currently living with mother. Has two children, who live with her mother in law. She reports history of substance dependence. Identifies narcotics /opiates as substance of choice, but states that over recent days she had been using methamphetamine mostly, and states " I stayed up for days ". She states she had not been eating or taking proper care of herself. She also started experiencing visual hallucinations, and came to ED. As per notes, on admission to ED presented rambling, incoherent . Patient reports history of depression, but no suicide attempts or history of self cutting . Long history of substance abuse .  At this time patient presents fully alert, attentive, not confused or agitated. She does report symptoms of opiate withdrawal- reports cramps, feeling chills, nausea, aches .  Dx- Opiate Dependence, Stimulant Dependence, Substance Induced Psychosis ( resolving ) , Opiate WDL Plan- opiate detox protocol with clonidine.  Patient interested in antidepressant - due to recent  poor sleep and significant weight loss , we agreed on REMERON trial. Side effects discussed. Start Remeron 15 mgrs QHS.  Today on 05/12/16, pt seen and chart reviewed for H&P: : Pt seen and chart reviewed. Pt is alert/oriented x4, calm, cooperative, and appropriate to situation. Pt denies suicidal/homicidal ideation and psychosis and does not appear to be responding to internal stimuli. Pt reports that she has been struggling with insomnia, depression, and substance abuse, primarily opiates. She is optimistic about her treatment plan and would like to participate in NA meetings in addition to medication management.     Associated Signs/Symptoms: Depression Symptoms:  depressed mood, anhedonia, insomnia, psychomotor agitation, psychomotor retardation, feelings of worthlessness/guilt, difficulty concentrating, hopelessness, impaired memory, anxiety, loss of energy/fatigue, (Hypo) Manic Symptoms:  Distractibility, Hallucinations, Impulsivity, Irritable Mood, Labiality of Mood, Anxiety Symptoms:  Excessive Worry, Panic Symptoms, Psychotic Symptoms:  Paranoia, PTSD Symptoms: NA Total Time spent with patient: 45 minutes  Past Psychiatric History: MDD, substance abuse, insomnia  Is the patient at risk to self? No.  Has the patient been a risk to self in the past 6 months? No.  Has the patient been a risk to self within the distant past? No.  Is the patient a risk to others? No.  Has the patient been a risk to others in the past 6 months? No.  Has the patient been a risk to others within the distant past? No.   Prior Inpatient Therapy:   Prior Outpatient Therapy:    Alcohol Screening: 1. How often do you have a drink containing alcohol?: Monthly or less 2. How many drinks containing alcohol do you have on a typical day when you are drinking?: 1 or 2 3. How often do you have  six or more drinks on one occasion?: Never Preliminary Score: 0 9. Have you or someone else been injured as  a result of your drinking?: No 10. Has a relative or friend or a doctor or another health worker been concerned about your drinking or suggested you cut down?: No Alcohol Use Disorder Identification Test Final Score (AUDIT): 1 Brief Intervention: AUDIT score less than 7 or less-screening does not suggest unhealthy drinking-brief intervention not indicated Substance Abuse History in the last 12 months:  Yes.   Consequences of Substance Abuse: Medical Consequences:  instability Previous Psychotropic Medications: Yes  Psychological Evaluations: Yes  Past Medical History:  Past Medical History  Diagnosis Date  . Chronic shoulder pain   . Anxiety   . Hepatitis C   . Polysubstance abuse   . Abnormal Pap smear of vagina   . Drug use     Past Surgical History  Procedure Laterality Date  . Ankle surgery     Family History:  Family History  Problem Relation Age of Onset  . Cirrhosis Father   . Hepatitis C Father    Family Psychiatric  History: MDD Tobacco Screening: @FLOW ([phone])::1)@ Social History:  History  Alcohol Use  . Yes     History  Drug Use  . Yes  . Special: Heroin, Marijuana, Methamphetamines    Comment: narcotics a few days ago,     Additional Social History: Marital status: Long term relationship Separated, when?: 2014 What types of issues is patient dealing with in the relationship?: Husband in prison for last 3 years; has at least 9 more to go Additional relationship information: Pt also has significant other who is just released from jail and waiting for her at moms and she is anxious to get out and see him Are you sexually active?: Yes What is your sexual orientation?: Hetero Has your sexual activity been affected by drugs, alcohol, medication, or emotional stress?: 'not really' Does patient have children?: Yes How many children?: 2 How is patient's relationship with their children?: Pt can onlky see children once weekly for 4 hours for visitation as  mother in law has them currently                         Allergies:   Allergies  Allergen Reactions  . Penicillins Anaphylaxis, Hives and Other (See Comments)    Has patient had a PCN reaction causing immediate rash, facial/tongue/throat swelling, SOB or lightheadedness with hypotension: Yes Has patient had a PCN reaction causing severe rash involving mucus membranes or skin necrosis: No Has patient had a PCN reaction that required hospitalization No Has patient had a PCN reaction occurring within the last 10 years: No If all of the above answers are "NO", then may proceed with Cephalosporin use.  headaches  . Tylenol [Acetaminophen] Hives  . Venlafaxine Hives and Other (See Comments)    Headaches    . Morphine And Related Hives and Other (See Comments)    Reaction: headaches   Lab Results: No results found for this or any previous visit (from the past 48 hour(s)).  Blood Alcohol level:  Lab Results  Component Value Date   ETH <5 05/09/2016   ETH <5 00/86/7619    Metabolic Disorder Labs:  No results found for: HGBA1C, MPG No results found for: PROLACTIN No results found for: CHOL, TRIG, HDL, CHOLHDL, VLDL, LDLCALC  Current Medications: Current Facility-Administered Medications  Medication Dose Route Frequency Provider Last Rate Last Dose  .  cloNIDine (CATAPRES) tablet 0.1 mg  0.1 mg Oral QID Lurena Nida, NP   0.1 mg at 05/13/16 1645   Followed by  . [START ON 05/14/2016] cloNIDine (CATAPRES) tablet 0.1 mg  0.1 mg Oral BH-qamhs Lurena Nida, NP       Followed by  . [START ON 05/17/2016] cloNIDine (CATAPRES) tablet 0.1 mg  0.1 mg Oral QAC breakfast Lurena Nida, NP      . dicyclomine (BENTYL) tablet 20 mg  20 mg Oral Q6H PRN Lurena Nida, NP      . feeding supplement (ENSURE ENLIVE) (ENSURE ENLIVE) liquid 237 mL  237 mL Oral BID BM Myer Peer Cobos, MD   237 mL at 05/13/16 1403  . hydrOXYzine (ATARAX/VISTARIL) tablet 25 mg  25 mg Oral Q6H PRN Lurena Nida, NP    25 mg at 05/13/16 1646  . loperamide (IMODIUM) capsule 2-4 mg  2-4 mg Oral PRN Lurena Nida, NP      . methocarbamol (ROBAXIN) tablet 500 mg  500 mg Oral Q8H PRN Lurena Nida, NP   500 mg at 05/12/16 0031  . mirtazapine (REMERON) tablet 15 mg  15 mg Oral QHS Myer Peer Cobos, MD   15 mg at 05/13/16 2138  . naproxen (NAPROSYN) tablet 500 mg  500 mg Oral BID PRN Lurena Nida, NP   500 mg at 05/13/16 0858  . nicotine (NICODERM CQ - dosed in mg/24 hours) patch 21 mg  21 mg Transdermal Daily Jenne Campus, MD   21 mg at 05/13/16 0859  . ondansetron (ZOFRAN-ODT) disintegrating tablet 4 mg  4 mg Oral Q6H PRN Lurena Nida, NP       PTA Medications: Prescriptions prior to admission  Medication Sig Dispense Refill Last Dose  . cyclobenzaprine (FLEXERIL) 5 MG tablet Take 1 tablet (5 mg total) by mouth 3 (three) times daily as needed (muscle soreness). (Patient not taking: Reported on 05/10/2016) 30 tablet 0   . levonorgestrel (MIRENA) 20 MCG/24HR IUD 1 each by Intrauterine route once.   current  . Naloxone HCl 0.4 MG/0.4ML SOAJ Inject 1 application as directed once. Dispense 1 naloxone autoinjector kit (Patient not taking: Reported on 05/10/2016) 1 Package 0   . naproxen (NAPROSYN) 500 MG tablet Take 1 po BID with food prn pain (Patient not taking: Reported on 05/10/2016) 30 tablet 0   . oxyCODONE (OXY IR/ROXICODONE) 5 MG immediate release tablet Take 1 tablet (5 mg total) by mouth every 4 (four) hours as needed for severe pain. (Patient not taking: Reported on 05/10/2016) 8 tablet 0   . potassium chloride (K-DUR) 10 MEQ tablet Take 1 tablet (10 mEq total) by mouth daily. (Patient not taking: Reported on 05/10/2016) 5 tablet 0   . traMADol (ULTRAM) 50 MG tablet Take 1 tablet (50 mg total) by mouth every 6 (six) hours as needed. (Patient not taking: Reported on 05/10/2016) 15 tablet 0     Musculoskeletal: Strength & Muscle Tone: within normal limits Gait & Station: normal Patient leans:  [name]  Psychiatric Specialty Exam: Physical Exam  Review of Systems  Psychiatric/Behavioral: Positive for depression and substance abuse. Negative for suicidal ideas and hallucinations. The patient is nervous/anxious and has insomnia.   All other systems reviewed and are negative.   Blood pressure 103/59, pulse 60, temperature 98.1 F (36.7 C), temperature source Oral, resp. rate 20, height 5' 5"  (1.651 m), weight 51.71 kg (114 lb), SpO2 100 %.Body mass index is 18.97 kg/(m^2).  General Appearance: Casual and Fairly Groomed  Eye Contact:  Fair  Speech:  Clear and Coherent and Normal Rate  Volume:  Normal  Mood:  Anxious and Depressed  Affect:  Appropriate, Congruent and Depressed  Thought Process:  Disorganized and Descriptions of Associations: Loose  Orientation:  Full (Time, Place, and Person)  Thought Content:  Symptoms, worries, concerns  Suicidal Thoughts:  No  Homicidal Thoughts:  No  Memory:  Immediate;   Fair Recent;   Fair Remote;   Fair  Judgement:  Fair  Insight:  Fair  Psychomotor Activity:  Normal  Concentration:  Concentration: Fair and Attention Span: Fair  Recall:  [employer] of Knowledge:  Fair  Language:  Fair  Akathisia:  No  Handed:    AIMS (if indicated):     Assets:  Communication Skills Desire for Improvement Resilience Social Support  ADL's:  Intact  Cognition:  WNL  Sleep:  Number of Hours: 6   Treatment Plan Summary: Substance induced mood disorder ([place]), unstable, warrants inpatient admission; managed as below:   Medications: Remeron 65m po qhs insomnia/mdd Nicotine gum Clonidine protocol for detox  Observation Level/Precautions:  15 minute checks  Laboratory:  Labs resulted, reviewed, and stable at this time.   Psychotherapy:  Group therapy, individual therapy, psychoeducation  Medications:  See MAR above  Consultations: None    Discharge Concerns: None    Estimated LOS: 5-7 days  Other:  [name]    I certify that inpatient services  furnished can reasonably be expected to improve the patient's condition.    WBenjamine Mola FNorth Sweet Home 05/12/16   2:22PM I have reviewed case with NP and have met with patient  Agree with NP note/ assessment  as above  89year old married ( separated ) female, currently living with mother. Has two children, who live with her mother in law. She reports history of substance dependence. Identifies narcotics /opiates as substance of choice, but states that over recent days she had been using methamphetamine mostly, and states " I stayed up for days ". She states she had not been eating or taking proper care of herself. She also started experiencing visual hallucinations, and came to ED. As per notes, on admission to ED presented rambling, incoherent . Patient reports history of depression, but no suicide attempts or history of self cutting . Long history of substance abuse .  At this time patient presents fully alert, attentive, not confused or agitated. She does report symptoms of opiate withdrawal- reports cramps, feeling chills, nausea, aches .  Dx- Opiate Dependence, Stimulant Dependence, Substance Induced Psychosis ( resolving ) , Opiate WDL Plan- opiate detox protocol with clonidine.  Patient interested in antidepressant - due to recent poor sleep and significant weight loss , we agreed on REMERON trial. Side effects discussed. Start Remeron 15 mgrs QHS.

## 2016-05-13 NOTE — Progress Notes (Signed)
[facility]  05/13/2016 10:53 PM Heidi RoseJodi [name]  MRN:  [id number] Subjective:  "I'm feeling a little better today but still kinda down"   Objective: Pt seen and chart reviewed. Pt is alert/oriented x4, calm, cooperative, and appropriate to situation. Pt denies suicidal/homicidal ideation and psychosis and does not appear to be responding to internal stimuli. Pt reports that she is not experiencing withdrawal symptoms at this time and is responding to medication well in terms of a mild improvement in anxiety and depression yet feels she is not yet improving as fast as she wants to. Discussed time for onset of various medications and that they take time.   Principal Problem: Substance induced mood disorder ([facility]) Diagnosis:   Patient Active Problem List   Diagnosis Date Noted  . Substance induced mood disorder ([facility]) [F19.94] 05/12/2016  . Elevated LFTs [R79.89] 06/14/2014  . Hepatitis C, acute [B17.10] 06/05/2014  . Polysubstance abuse [F19.10] 06/04/2014  . Acute hepatitis [K72.00] 06/03/2014  . Cholestatic jaundice [R17] 06/03/2014  . Cystitis, acute [N30.00] 06/03/2014  . Chronic pain syndrome [G89.4] 06/03/2014  . Hypokalemia [E87.6] 06/03/2014  . CLOSED FRACTURE OF MEDIAL MALLEOLUS [S82.53XA] 06/20/2010  . ANKLE SPRAIN [S93.409A] 01/07/2008   Total Time spent with patient: 15 minutes   Past Medical History:  Past Medical History  Diagnosis Date  . Chronic shoulder pain   . Anxiety   . Hepatitis C   . Polysubstance abuse   . Abnormal Pap smear of vagina   . Drug use     Past Surgical History  Procedure Laterality Date  . Ankle surgery     Family History:  Family History  Problem Relation Age of Onset  . Cirrhosis Father   . Hepatitis C Father    Social History:  History  Alcohol Use  . Yes     History  Drug Use  . Yes  . Special: Heroin, Marijuana, Methamphetamines    Comment: narcotics a few days ago,     Social History   Social History  . Marital  Status: Legally Separated    Spouse Name: [name]  . Number of Children: 2  . Years of Education: [name]   Occupational History  . UNEMPLOYED    Social History Main Topics  . Smoking status: Current Every Day Smoker -- 1.00 packs/day    Types: Cigarettes  . Smokeless tobacco: Never Used  . Alcohol Use: Yes  . Drug Use: Yes    Special: Heroin, Marijuana, Methamphetamines     Comment: narcotics a few days ago,   . Sexual Activity: Not Currently    Birth Control/ Protection: IUD   Other Topics Concern  . None   Social History Narrative   Additional Social History:                         Sleep: Fair  Appetite:  Good  Current Medications: Current Facility-Administered Medications  Medication Dose Route Frequency Provider Last Rate Last Dose  . cloNIDine (CATAPRES) tablet 0.1 mg  0.1 mg Oral QID Kristeen MansFran E Hobson, NP   0.1 mg at 05/13/16 1645   Followed by  . [START ON 05/14/2016] cloNIDine (CATAPRES) tablet 0.1 mg  0.1 mg Oral BH-qamhs Kristeen MansFran E Hobson, NP       Followed by  . [START ON 05/17/2016] cloNIDine (CATAPRES) tablet 0.1 mg  0.1 mg Oral QAC breakfast Kristeen MansFran E Hobson, NP      . dicyclomine (BENTYL) tablet 20 mg  20 mg Oral Q6H PRN Kristeen Mans, NP      . feeding supplement (ENSURE ENLIVE) (ENSURE ENLIVE) liquid 237 mL  237 mL Oral BID BM Rockey Situ Kaylan Yates, MD   237 mL at 05/13/16 1403  . hydrOXYzine (ATARAX/VISTARIL) tablet 25 mg  25 mg Oral Q6H PRN Kristeen Mans, NP   25 mg at 05/13/16 1646  . loperamide (IMODIUM) capsule 2-4 mg  2-4 mg Oral PRN Kristeen Mans, NP      . methocarbamol (ROBAXIN) tablet 500 mg  500 mg Oral Q8H PRN Kristeen Mans, NP   500 mg at 05/12/16 0031  . mirtazapine (REMERON) tablet 15 mg  15 mg Oral QHS Rockey Situ Willetta York, MD   15 mg at 05/13/16 2138  . naproxen (NAPROSYN) tablet 500 mg  500 mg Oral BID PRN Kristeen Mans, NP   500 mg at 05/13/16 0858  . nicotine (NICODERM CQ - dosed in mg/24 hours) patch 21 mg  21 mg Transdermal Daily Craige Cotta, MD    21 mg at 05/13/16 0859  . ondansetron (ZOFRAN-ODT) disintegrating tablet 4 mg  4 mg Oral Q6H PRN Kristeen Mans, NP        Lab Results: No results found for this or any previous visit (from the past 48 hour(s)).  Blood Alcohol level:  Lab Results  Component Value Date   ETH <5 05/09/2016   ETH <5 01/25/2016    Physical Findings: AIMS: Facial and Oral Movements Muscles of Facial Expression: None, normal Lips and Perioral Area: None, normal Jaw: None, normal Tongue: None, normal,Extremity Movements Upper (arms, wrists, hands, fingers): None, normal Lower (legs, knees, ankles, toes): None, normal, Trunk Movements Neck, shoulders, hips: None, normal, Overall Severity Severity of abnormal movements (highest score from questions above): None, normal Incapacitation due to abnormal movements: None, normal Patient's awareness of abnormal movements (rate only patient's report): No Awareness, Dental Status Current problems with teeth and/or dentures?: No Does patient usually wear dentures?: No  CIWA:    COWS:  COWS Total Score: 1  Musculoskeletal: Strength & Muscle Tone: within normal limits Gait & Station: normal Patient leans: [name]  Psychiatric Specialty Exam: Physical Exam  Review of Systems  Psychiatric/Behavioral: Positive for depression and substance abuse. Negative for suicidal ideas and hallucinations. The patient is nervous/anxious and has insomnia.   All other systems reviewed and are negative.   Blood pressure 103/59, pulse 60, temperature 98.1 F (36.7 C), temperature source Oral, resp. rate 20, height  (1.651 m), weight 51.71 kg (114 lb), SpO2 100 %.Body mass index is 18.97 kg/(m^2).  General Appearance: Casual and Fairly Groomed  Eye Contact:  Fair  Speech:  Clear and Coherent and Normal Rate  Volume:  Decreased  Mood:  Anxious and Depressed  Affect:  Appropriate, Congruent, Depressed and and anxious  Thought Process:  Coherent, Linear and Descriptions of  Associations: Loose  Orientation:  Full (Time, Place, and Person)  Thought Content:  Symptoms, worries, concerns  Suicidal Thoughts:  No  Homicidal Thoughts:  No  Memory:  Immediate;   Fair Recent;   Fair Remote;   Fair  Judgement:  Fair  Insight:  Fair  Psychomotor Activity:  Normal  Concentration:  Concentration: Fair and Attention Span: Fair  Recall:  [employer] of Knowledge:  Fair  Language:  Fair  Akathisia:  No  Handed:    AIMS (if indicated):     Assets:  Communication Skills Desire for Improvement Resilience Social  Support  ADL's:  Intact  Cognition:  WNL  Sleep:  Number of Hours: 6   Treatment Plan Summary: Substance induced mood disorder ([facility]), unstable, managed as below:   On 05/13/16 I have reviewed my treatment plan and continue as below as it is working well:   Medications: Remeron 15mg  po qhs insomnia/mdd Nicotine gum Clonidine protocol for detox   Beau Fanny, FNP 05/13/2016, 10:53 PM Agree with NP progress note as above

## 2016-05-13 NOTE — Tx Team (Addendum)
Interdisciplinary Treatment Plan Update (Adult)  Date:  05/13/2016  Time Reviewed:  8:33 AM   Progress in Treatment: Attending groups: No. Participating in groups:  No. Taking medication as prescribed:  Yes. Tolerating medication:  Yes. Family/Significant othe contact made:  CSW assessing for appropriate contacts Patient understands diagnosis:  Yes. and As evidenced by:  seeking treatment for opiate/cocaine/benzo/amphetamine abuse, depression, and for medication stabilization.  Discussing patient identified problems/goals with staff:  Yes. Medical problems stabilized or resolved:  Yes. Denies suicidal/homicidal ideation: Yes. Issues/concerns per patient self-inventory:  Other:  Discharge Plan or Barriers: Home with outpatient services.  Reason for Continuation of Hospitalization: Depression Medication stabilization Withdrawal symptoms  Comments:  Heidi [name] is an 30 y.o. female who was brought to the ED by EMS and is under IVC due to substance induced psychosis and not being oriented x4. Pt UDS resulted and she was positive for Opiates, Cocaine, Benzos, Amphetimines and THC. Pt only disclosed that she was using crystal meth and heroin but could not give an accurate time of when she last used. When clinician asked her she said "I used 3 days before my daughter was born". When clinician asked when her daughter was born she stated "she's 12". Per records pt has been to the ED 3-4 times in the past 2 years requesting detox and help with addiction issues. It does not appear that she has been inpatient for psychiatric conditions and has not been suicidal or homicidal in the past. No history of psychosis before this encounter as well. Pt denies SI, HI at present. Per RN mom stated that pt has been using crystal meth for 2 weeks straight and has not been eating. She states that she believes she might have lost 15-20lbs in this time frame. Pt appears to be malnurished and looks like she has poor  hygiene. She states that she lives with her mom. Previous records state that she is positive for hep C. Diagnosis: Substance induced psychosis   Estimated length of stay:  1-2 days   New goal(s): to develop effective aftercare plan.   Additional Comments:  Patient and CSW reviewed pt's identified goals and treatment plan. Patient verbalized understanding and agreed to treatment plan. CSW reviewed [facility] "Discharge Process and Patient Involvement" Form. Pt verbalized understanding of information provided and signed form.    Review of initial/current patient goals per problem list:  1. Goal(s): Patient will participate in aftercare plan  Met: Yes  Target date: at discharge  As evidenced by: Patient will participate within aftercare plan AEB aftercare provider and housing plan at discharge being identified.   5/29: CSW assessing. Pt did not attend d/c planning group this morning.  5/31: Goal met. Patient plans to discharge home to follow up with outpatient services.    2. Goal (s): Patient will exhibit decreased depressive symptoms and suicidal ideations.  Met: Adequate for discharge per MD   Target date: at discharge  As evidenced by: Patient will utilize self rating of depression at 3 or below and demonstrate decreased signs of depression or be deemed stable for discharge by MD.  5/29: Pt rates depression as high but denies SI/Hi/AVH.  5/30: Adequate for discharge. Rates depression at 4, denies SI.   3. Goal(s): Patient will demonstrate decreased signs of withdrawal due to substance abuse  Met:Yes  Target date:at discharge   As evidenced by: Patient will produce a CIWA/COWS score of 0, have stable vitals signs, and no symptoms of withdrawal.  5/29: Pt reports mild  withdrawals with COWS of 1 and stable vitals. Goal progressing.  5/31: Goal met. No withdrawal symptoms reported at this time per medical chart.    Attendees: Patient:   05/13/2016 8:33 AM   Family:    05/13/2016 8:33 AM   Physician:  Dr. Parke Poisson MD 05/13/2016 8:33 AM   Nursing:   Trish Mage RN 05/13/2016 8:33 AM   Clinical Social Worker: Maxie Better, LCSW 05/13/2016 8:33 AM   Clinical Social Worker: Erasmo Downer Jameelah Watts LCSW; Peri Maris LCSWA 05/13/2016 8:33 AM   Other:   05/13/2016 8:33 AM   Other:   05/13/2016 8:33 AM   Other:   05/13/2016 8:33 AM   Other:  05/13/2016 8:33 AM   Other:  05/13/2016 8:33 AM   Other:  05/13/2016 8:33 AM    05/13/2016 8:33 AM    05/13/2016 8:33 AM    05/13/2016 8:33 AM    05/13/2016 8:33 AM    Scribe for Treatment Team:   Maxie Better, LCSW 05/13/2016 8:33 AM

## 2016-05-13 NOTE — Progress Notes (Signed)
D:Patient in the dayroom sitting quietly on approach.  Patient states she had a good day.  Patient appears flat and depressed.  Patient states her withdrawal symptoms are minimal.  Patient states her plan is to get into a Suboxone clinic when discharged.  Patient denies SI/HI and denies AVH. A: Staff to monitor Q 15 mins for safety.  Encouragement and support offered.  Scheduled medications administered per orders. R: Patient remains safe on the unit.  Patient attended group tonight.  Patient visible on the unit.  Patient taking administered medications.

## 2016-05-13 NOTE — Progress Notes (Signed)
Recreation Therapy Notes  Date: 05.29.2017 Time: 9:30am Location: [address] Dayroom   Group Topic: Stress Management  Goal Area(s) Addresses:  Patient will actively participate in stress management techniques presented during session.   Behavioral Response: Did not attend.   Coriana Angello L March Joos, LRT/CTRS        Whitten Andreoni L 05/13/2016 2:35 PM 

## 2016-05-14 DIAGNOSIS — F1994 Other psychoactive substance use, unspecified with psychoactive substance-induced mood disorder: Principal | ICD-10-CM

## 2016-05-14 DIAGNOSIS — F1120 Opioid dependence, uncomplicated: Secondary | ICD-10-CM

## 2016-05-14 DIAGNOSIS — F1520 Other stimulant dependence, uncomplicated: Secondary | ICD-10-CM

## 2016-05-14 NOTE — Progress Notes (Signed)
Denies SI/HI/AVH. Encouragement and support given. Medications administered as prescribed. Continue Q 15 minute checks for patient safety and medication effectiveness. 

## 2016-05-14 NOTE — [facility] Group Notes (Signed)
[facility] LCSW Group Therapy  05/14/2016 1:28 PM  Type of Therapy:  Group Therapy  Participation Level:  Active  Participation Quality:  Attentive  Affect:  Appropriate  Cognitive:  Alert and Oriented  Insight:  Improving  Engagement in Therapy:  Improving  Modes of Intervention:  Discussion, Education, Exploration, Problem-solving, Rapport Building, Socialization and Support  Summary of Progress/Problems: [facility] Speaker came to talk about his personal journey with substance abuse and addiction. The pt processed ways by which to relate to the speaker. MHA speaker provided handouts and educational information pertaining to groups and services offered by the [facility].   Smart, Heidi Georgiades LCSW 05/14/2016, 1:28 PM

## 2016-05-14 NOTE — Progress Notes (Signed)
Patient ID: Heidi [name], female   DOB: 10/05/1986, 30 y.o.   MRN: [id number]   Pt currently presents with a flat affect and isolative behavior. Per self inventory, pt rates depression at a 4, hopelessness 2 and anxiety 3. Pt's daily goal is to "stay clean" and they intend to do so by "stay sober and focused." Pt reports good sleep, a good appetite, low energy and good concentration. Pt wishes to be discharged soon.  Pt provided with medications per providers orders. Pt's labs and vitals were monitored throughout the day. Pt supported emotionally and encouraged to express concerns and questions. Pt educated on medications.  Pt's safety ensured with 15 minute and environmental checks. Pt currently denies SI/HI and A/V hallucinations. Pt verbally agrees to seek staff if SI/HI or A/VH occurs and to consult with staff before acting on any harmful thoughts. Pt remains in bed for most of the day. Reports increased drowsiness this am. Will continue POC.

## 2016-05-14 NOTE — Progress Notes (Signed)
Patient ID: Loleta RoseJodi Angevine, female   DOB: 11/02/1986, 30 y.o.   MRN: [id number] Adult Psychoeducational Group Note  Date:  05/14/2016 Time:  09:15am   Group Topic/Focus:  Discharge Planning  Participation Level:  Did Not Attend  Participation Quality: n/a  Affect: n/a  Cognitive:  n/a  Insight: n/a  Engagement in Group: n/a  Modes of Intervention:  Education and Support  Additional Comments:  Pt did not attend, pt in bed asleep.   Aurora Maskwyman, Harkirat Orozco E 05/14/2016, 12:49 PM

## 2016-05-14 NOTE — [facility] Group Notes (Signed)
[facility] Group Notes:  (Nursing/MHT/Case Management/Adjunct)  Date:  05/14/2016  Time:  9:14 AM  Type of Therapy:  Psychoeducational Skills  Participation Level:  Did Not Attend  Patient invited; declined to attend.  Heidi [name] 05/14/2016, 9:14 AM 

## 2016-05-14 NOTE — Progress Notes (Signed)
[facility] MD Progress Note  05/14/2016 2:39 PM Zykia Walla  MRN:  [id number]   Subjective:  Patient states that her BP was low because of the Clonidine protocol for opiate withdrawal.  "I think I'm ready to go.  I can follow up with [facility] in [place].   Objective:  Allona is a 30 year old married ( separated ) female, currently living with mother.  She has two children, who live with her mother in law. She reports history of substance dependence. Identifies narcotics /opiates as substance of choice, but states that over recent days she had been using methamphetamine mostly, and states " I stayed up for days ". Patient has a healing wound on right hand from IV drug use.  Denies pain.    Principal Problem: Substance induced mood disorder (HCC) Diagnosis:  Opiate Dependence, Stimulant Dependence, Substance Induced Psychosis  Patient Active Problem List   Diagnosis Date Noted  . Substance induced mood disorder (HCC) [F19.94] 05/12/2016  . Elevated LFTs [R79.89] 06/14/2014  . Hepatitis C, acute [B17.10] 06/05/2014  . Polysubstance abuse [F19.10] 06/04/2014  . Acute hepatitis [K72.00] 06/03/2014  . Cholestatic jaundice [R17] 06/03/2014  . Cystitis, acute [N30.00] 06/03/2014  . Chronic pain syndrome [G89.4] 06/03/2014  . Hypokalemia [E87.6] 06/03/2014  . CLOSED FRACTURE OF MEDIAL MALLEOLUS [S82.53XA] 06/20/2010  . ANKLE SPRAIN [S93.409A] 01/07/2008   Total Time spent with patient: 30 minutes  Past Psychiatric History: see HPI  Past Medical History:  Past Medical History  Diagnosis Date  . Chronic shoulder pain   . Anxiety   . Hepatitis C   . Polysubstance abuse   . Abnormal Pap smear of vagina   . Drug use     Past Surgical History  Procedure Laterality Date  . Ankle surgery     Family History:  Family History  Problem Relation Age of Onset  . Cirrhosis Father   . Hepatitis C Father    Family Psychiatric  History:  See HPI Social History:  History  Alcohol Use  . Yes      History  Drug Use  . Yes  . Special: Heroin, Marijuana, Methamphetamines    Comment: narcotics a few days ago,     Social History   Social History  . Marital Status: Legally Separated    Spouse Name: [name]  . Number of Children: 2  . Years of Education: [name]   Occupational History  . UNEMPLOYED    Social History Main Topics  . Smoking status: Current Every Day Smoker -- 1.00 packs/day    Types: Cigarettes  . Smokeless tobacco: Never Used  . Alcohol Use: Yes  . Drug Use: Yes    Special: Heroin, Marijuana, Methamphetamines     Comment: narcotics a few days ago,   . Sexual Activity: Not Currently    Birth Control/ Protection: IUD   Other Topics Concern  . None   Social History Narrative   Additional Social History:     Sleep: Fair  Appetite:  Fair  Current Medications: Current Facility-Administered Medications  Medication Dose Route Frequency Provider Last Rate Last Dose  . cloNIDine (CATAPRES) tablet 0.1 mg  0.1 mg Oral QID Kristeen Mans, NP   0.1 mg at 05/14/16 8657   Followed by  . cloNIDine (CATAPRES) tablet 0.1 mg  0.1 mg Oral BH-qamhs Kristeen Mans, NP       Followed by  . [START ON 05/17/2016] cloNIDine (CATAPRES) tablet 0.1 mg  0.1 mg Oral QAC breakfast Angelina Ok  Link SnufferHobson, NP      . dicyclomine (BENTYL) tablet 20 mg  20 mg Oral Q6H PRN Kristeen MansFran E Hobson, NP      . feeding supplement (ENSURE ENLIVE) (ENSURE ENLIVE) liquid 237 mL  237 mL Oral BID BM Rockey SituFernando A Waverly Chavarria, MD   237 mL at 05/14/16 0837  . hydrOXYzine (ATARAX/VISTARIL) tablet 25 mg  25 mg Oral Q6H PRN Kristeen MansFran E Hobson, NP   25 mg at 05/14/16 1150  . loperamide (IMODIUM) capsule 2-4 mg  2-4 mg Oral PRN Kristeen MansFran E Hobson, NP      . methocarbamol (ROBAXIN) tablet 500 mg  500 mg Oral Q8H PRN Kristeen MansFran E Hobson, NP   500 mg at 05/12/16 0031  . mirtazapine (REMERON) tablet 15 mg  15 mg Oral QHS Rockey SituFernando A Crista Nuon, MD   15 mg at 05/13/16 2138  . naproxen (NAPROSYN) tablet 500 mg  500 mg Oral BID PRN Kristeen MansFran E Hobson, NP   500 mg at  05/13/16 0858  . nicotine (NICODERM CQ - dosed in mg/24 hours) patch 21 mg  21 mg Transdermal Daily Craige CottaFernando A Aislinn Feliz, MD   21 mg at 05/13/16 0859  . ondansetron (ZOFRAN-ODT) disintegrating tablet 4 mg  4 mg Oral Q6H PRN Kristeen MansFran E Hobson, NP        Lab Results: No results found for this or any previous visit (from the past 48 hour(s)).  Blood Alcohol level:  Lab Results  Component Value Date   ETH <5 05/09/2016   ETH <5 01/25/2016    Physical Findings: AIMS: Facial and Oral Movements Muscles of Facial Expression: None, normal Lips and Perioral Area: None, normal Jaw: None, normal Tongue: None, normal,Extremity Movements Upper (arms, wrists, hands, fingers): None, normal Lower (legs, knees, ankles, toes): None, normal, Trunk Movements Neck, shoulders, hips: None, normal, Overall Severity Severity of abnormal movements (highest score from questions above): None, normal Incapacitation due to abnormal movements: None, normal Patient's awareness of abnormal movements (rate only patient's report): No Awareness, Dental Status Current problems with teeth and/or dentures?: No Does patient usually wear dentures?: No  CIWA:    COWS:  COWS Total Score: 1  Musculoskeletal: Strength & Muscle Tone: within normal limits Gait & Station: normal Patient leans: [name]  Psychiatric Specialty Exam: Physical Exam  Nursing note and vitals reviewed. Psychiatric: Her mood appears anxious. She does not exhibit a depressed mood. She expresses no homicidal and no suicidal ideation.    Review of Systems  Psychiatric/Behavioral: Positive for depression and substance abuse. The patient is nervous/anxious.   All other systems reviewed and are negative.   Blood pressure 102/64, pulse 78, temperature 98.4 F (36.9 C), temperature source Oral, resp. rate 16, height 5\' 5"  (1.651 m), weight 51.71 kg (114 lb), SpO2 100 %.Body mass index is 18.97 kg/(m^2).   General Appearance: fairly groomed   Eye Contact:  Good  Speech: Normal Rate  Volume: Decreased  Mood: states " a little better "  Affect: mildly constricted, vaguely irritable  Thought Process: Linear  Orientation: Other: fully alert and attentive   Thought Content: denies any ongoing hallucinations, and does not appear internally preoccupied at this time , no delusions expressed   Suicidal Thoughts: No  Homicidal Thoughts: No  Memory: recent and remote grossly intact   Judgement: Fair  Insight: Fair  Psychomotor Activity: Normal  Concentration: Concentration: Good and Attention Span: Good  Recall: Good  Fund of Knowledge: Good  Language: Good  Akathisia: Negative  Handed: Right  AIMS (if  indicated):    Assets: Desire for Improvement Resilience  ADL's: Intact  Cognition: WNL  Sleep: Number of Hours: 4       Treatment Plan Summary: Admit for crisis management and mood stabilization. Medication management to re-stabilize current mood symptoms:  opiate detox protocol with clonidine.  Patient interested in antidepressant - due to recent poor sleep and significant weight loss , we agreed on REMERON trial. Side effects discussed. Start Remeron 15 mgrs QHS. Clonidine withdrawal protocol ended.  Will keep monitor of low BP. Wound abscess right hand healing, will continue to monitor Group counseling sessions for coping skills Medical consults as needed Review and reinstate any pertinent home medications for other health problems  Lindwood Qua, NP [facility] 05/14/2016, 2:39 PM Agree with NP progress note as above

## 2016-05-15 MED ORDER — HYDROXYZINE HCL 25 MG PO TABS: 25.0000 mg | ORAL_TABLET | Freq: Four times a day (QID) | ORAL | Status: DC | PRN

## 2016-05-15 MED ORDER — NICOTINE 21 MG/24HR TD PT24: 21.0000 mg | MEDICATED_PATCH | Freq: Every day | TRANSDERMAL | Status: DC

## 2016-05-15 MED ORDER — MIRTAZAPINE 15 MG PO TABS: 15.0000 mg | ORAL_TABLET | Freq: Every day | ORAL | Status: DC

## 2016-05-15 NOTE — [facility] Group Notes (Signed)
[facility] LCSW Aftercare Discharge Planning Group Note  05/15/2016  8:45 AM  Participation Quality: Did Not Attend. Patient invited to participate but declined.  Elizer Bostic, MSW, LCSW Clinical Social Worker [facility] [phone]   

## 2016-05-15 NOTE — Progress Notes (Signed)
Patient ID: Heidi RoseJodi Haigh, female   DOB: 06/19/1986, 30 y.o.   MRN: [id number]  Pt. Denies SI/HI and A/V hallucinations. She reports sleep is good, appetite is good, energy level is normal, and concentration is good. She rates depression 4/10, hopelessness 2/10, and anxiety 5/10 before receiving Vistaril. Belongings returned to patient at time of discharge. Patient reported a headache this morning and received PRN Naproxen which provided relief and Vistaril for anxiety which was effective. Marland Kitchen. Discharge instructions and medications were reviewed with patient. Patient verbalized understanding of both medications and discharge instructions. Patient discharged to lobby with no distress. Q15 minute safety checks maintained until discharge.

## 2016-05-15 NOTE — [facility] Suicide Risk Assessment (Signed)
[facility]   Principal Problem: Substance induced mood disorder [facility](HCC) Discharge Diagnoses:  Patient Active Problem List   Diagnosis Date Noted  . Substance induced mood disorder (HCC) [F19.94] 05/12/2016  . Elevated LFTs [R79.89] 06/14/2014  . Hepatitis C, acute [B17.10] 06/05/2014  . Polysubstance abuse [F19.10] 06/04/2014  . Acute hepatitis [K72.00] 06/03/2014  . Cholestatic jaundice [R17] 06/03/2014  . Cystitis, acute [N30.00] 06/03/2014  . Chronic pain syndrome [G89.4] 06/03/2014  . Hypokalemia [E87.6] 06/03/2014  . CLOSED FRACTURE OF MEDIAL MALLEOLUS [S82.53XA] 06/20/2010  . ANKLE SPRAIN [S93.409A] 01/07/2008    Total Time spent with patient: 30 minutes  Musculoskeletal: Strength & Muscle Tone: within normal limits Gait & Station: normal Patient leans: [name]  Psychiatric Specialty Exam: ROS denies chest pain, no shortness of breath, no nausea, no vomiting, no fever, no chills  Blood pressure 124/63, pulse 70, temperature 97.7 F (36.5 C), temperature source Oral, resp. rate 15, height 5\' 5"  (1.651 m), weight 114 lb (51.71 kg), SpO2 100 %.Body mass index is 18.97 kg/(m^2).  General Appearance: Well Groomed  Eye Contact::  Good  Speech:  Normal Rate409  Volume:  Normal  Mood:  improved, denies depression at this time   Affect:  Appropriate- reactive    Thought Process:  Linear   Orientation:  Other:  fully alert and attentive, oriented x 3  Thought Content:  no hallucinations, no delusions , not internally preoccupied   Suicidal Thoughts:  No- denies any suicidal ideations, denies any self injurious ideations  Homicidal Thoughts:  No denies any violent or homicidal ideations  Memory:  recent and remote grossly intact   Judgement:  Other:  improved   Insight:  improved   Psychomotor Activity:  Normal  Concentration:  Good  Recall:  Good  Fund of Knowledge:Good  Language: Good  Akathisia:  Negative  Handed:  Right  AIMS (if indicated):      Assets:  Communication Skills Desire for Improvement Resilience  Sleep:  Number of Hours: 6.75  Cognition: WNL  ADL's:  Intact   Mental Status Per Nursing Assessment::   On Admission:     Demographic Factors:  30 year old female , currently living with mother   Loss Factors: Relapse, drug abuse   Historical Factors: History of Substance Dependence- opiates were substance of choice, but recently had been abusing methamphetamine, history of depression  Risk Reduction Factors:   Responsible for children under 28 years of age, Sense of responsibility to family and Living with another person, especially a relative  Continued Clinical Symptoms:  Patient is alert, attentive, improved grooming compared to admission , mood improved, denies depression at this time, affect appropriate, fuller in range, no thought disorder, denies suicidal ideations, no homicidal ideations, future oriented. Denies any lingering withdrawal symptoms. Denies medication side effects * Of note, patient presented with abscess on dorsum of hand, related to recent IV use- states currently much improved, less inflamed,  less erythema, less pain, no ongoing drainage. No fever , no chills. States she is not interested in management at this time, and states " it is so much better than it was, it is going away". Does agree to go to ER or Urgent Care if any drainage or worsening, or if any fever or chills .  Cognitive Features That Contribute To Risk:  No gross cognitive deficits noted upon discharge. Is alert , attentive, and oriented x 3     Suicide Risk:  Mild:  Suicidal ideation of limited frequency,  intensity, duration, and specificity.  There are no identifiable plans, no associated intent, mild dysphoria and related symptoms, good self-control (both objective and subjective assessment), few other risk factors, and identifiable protective factors, including available and accessible social support.  Follow-up  Information    Follow up with Arna Medici On 05/17/2016.   Why:  Appt on this date at 9:00AM for hospital follow-up/medication management/assessment for counseling services. Please bring DL/Picture ID, Social Security card with you to this appt.    Contact information:   [address] [place], [place] [address] Phone: [phone] Fax: [phone]      Plan Of Care/Follow-up recommendations:  Activity:  as tolerated  Diet:  Regular  Tests:  NA Other:  see below   Patient is requesting discharge and  Is leaving unit in good spirits. At this time there are no current grounds for involuntary commitment  Patient encouraged to maintain sobriety and keep focus on recovery and relapse prevention  Patient plans to return home .   Nehemiah Massed, MD 05/15/2016, 12:14 PM

## 2016-05-15 NOTE — Discharge Summary (Signed)
Physician Discharge Summary Note  Patient:  Heidi [name] is an 30 y.o., female MRN:  [id number] DOB:  1986-01-09 Patient phone:  [phone] (home)  Patient address:   [address] [place] [address],  Total Time spent with patient: 30 minutes  Date of Admission:  05/11/2016 Date of Discharge: 05/15/2016  Reason for Admission:  Substance abuse  Principal Problem: Substance induced mood disorder [facility]) Discharge Diagnoses: Patient Active Problem List   Diagnosis Date Noted  . Substance induced mood disorder (HCC) [F19.94] 05/12/2016  . Elevated LFTs [R79.89] 06/14/2014  . Hepatitis C, acute [B17.10] 06/05/2014  . Polysubstance abuse [F19.10] 06/04/2014  . Acute hepatitis [K72.00] 06/03/2014  . Cholestatic jaundice [R17] 06/03/2014  . Cystitis, acute [N30.00] 06/03/2014  . Chronic pain syndrome [G89.4] 06/03/2014  . Hypokalemia [E87.6] 06/03/2014  . CLOSED FRACTURE OF MEDIAL MALLEOLUS [S82.53XA] 06/20/2010  . ANKLE SPRAIN [S93.409A] 01/07/2008    Past Psychiatric History:  SEE HPI  Past Medical History:  Past Medical History  Diagnosis Date  . Chronic shoulder pain   . Anxiety   . Hepatitis C   . Polysubstance abuse   . Abnormal Pap smear of vagina   . Drug use     Past Surgical History  Procedure Laterality Date  . Ankle surgery     Family History:  Family History  Problem Relation Age of Onset  . Cirrhosis Father   . Hepatitis C Father    Family Psychiatric  History:  SEE HPI Social History:  History  Alcohol Use  . Yes     History  Drug Use  . Yes  . Special: Heroin, Marijuana, Methamphetamines    Comment: narcotics a few days ago,     Social History   Social History  . Marital Status: Legally Separated    Spouse Name: [name]  . Number of Children: 2  . Years of Education: [name]   Occupational History  . UNEMPLOYED    Social History Main Topics  . Smoking status: Current Every Day Smoker -- 1.00 packs/day    Types: Cigarettes  . Smokeless  tobacco: Never Used  . Alcohol Use: Yes  . Drug Use: Yes    Special: Heroin, Marijuana, Methamphetamines     Comment: narcotics a few days ago,   . Sexual Activity: Not Currently    Birth Control/ Protection: IUD   Other Topics Concern  . None   Social History Narrative    Hospital Course:  Heidi [name] is an 30 y.o. female who was brought to the ED by EMS and is under IVC due to substance induced psychosis and not being oriented x4.    Heidi [name] was admitted for Substance induced mood disorder (HCC) and crisis management.  She was treated with meds and their indications listed below.  Medical problems were identified and treated as needed.  Home medications were restarted as appropriate.  Improvement was monitored by observation and Heidi [name] daily report of symptom reduction.  Emotional and mental status was monitored by daily self inventory reports completed by Heidi [name] and clinical staff.  Patient reported continued improvement, denied any new concerns.  Patient had been compliant on medications and denied side effects.  Support and encouragement was provided.    Patient did well during inpatient stay.  At time of discharge, patient rated both depression and anxiety levels to be manageable and minimal.  Patient was able to identify the triggers of emotional crises and de-stabilizations.  Patient identified the positive things in life  that would help in dealing with feelings of loss, depression and unhealthy or abusive tendencies.         Heidi [name] was evaluated by the treatment team for stability and plans for continued recovery upon discharge.  She was offered further treatment options upon discharge including Residential, Intensive Outpatient and Outpatient treatment.  She will follow up with agencies listed below for medication management and counseling.  Encouraged patient to maintain satisfactory support network and home environment.  Advised to adhere to medication  compliance and outpatient treatment follow up.  Instructed on proper wound care for healing wound abscess to right hand.  Heidi [name] motivation was an integral factor for scheduling further treatment.  Employment, transportation, bed availability, health status, family support, and any pending legal issues were also considered during her hospital stay.  Upon completion of this admission the patient was both mentally and medically stable for discharge denying suicidal/homicidal ideation, auditory/visual/tactile hallucinations, delusional thoughts and paranoia.       Physical Findings: AIMS: Facial and Oral Movements Muscles of Facial Expression: None, normal Lips and Perioral Area: None, normal Jaw: None, normal Tongue: None, normal,Extremity Movements Upper (arms, wrists, hands, fingers): None, normal Lower (legs, knees, ankles, toes): None, normal, Trunk Movements Neck, shoulders, hips: None, normal, Overall Severity Severity of abnormal movements (highest score from questions above): None, normal Incapacitation due to abnormal movements: None, normal Patient's awareness of abnormal movements (rate only patient's report): No Awareness, Dental Status Current problems with teeth and/or dentures?: No Does patient usually wear dentures?: No  CIWA:    COWS:  COWS Total Score: 0  Musculoskeletal: Strength & Muscle Tone: within normal limits Gait & Station: normal Patient leans: [name]  Psychiatric Specialty Exam: Physical Exam  Nursing note and vitals reviewed. Psychiatric: Her mood appears anxious. She is not agitated. Thought content is not paranoid. She does not exhibit a depressed mood. She expresses no homicidal ideation.    Review of Systems  All other systems reviewed and are negative.   Blood pressure 124/63, pulse 70, temperature 97.7 F (36.5 C), temperature source Oral, resp. rate 15, height  (1.651 m), weight 51.71 kg (114 lb), SpO2 100 %.Body mass index is 18.97  kg/(m^2).    Have you used any form of tobacco in the last 30 days? (Cigarettes, Smokeless Tobacco, Cigars, and/or Pipes): Yes  Has this patient used any form of tobacco in the last 30 days? (Cigarettes, Smokeless Tobacco, Cigars, and/or Pipes) Yes, Rx given  Blood Alcohol level:  Lab Results  Component Value Date   ETH <5 05/09/2016   ETH <5 01/25/2016    Metabolic Disorder Labs:  No results found for: HGBA1C, MPG No results found for: PROLACTIN No results found for: CHOL, TRIG, HDL, CHOLHDL, VLDL, LDLCALC  See Psychiatric Specialty Exam and Suicide Risk Assessment completed by Attending Physician prior to discharge.  Discharge destination:  Home  Is patient on multiple antipsychotic therapies at discharge:  No   Has Patient had three or more failed trials of antipsychotic monotherapy by history:  No  Recommended Plan for Multiple Antipsychotic Therapies: NA     Medication List    STOP taking these medications        cyclobenzaprine 5 MG tablet  Commonly known as:  FLEXERIL     levonorgestrel 20 MCG/24HR IUD  Commonly known as:  MIRENA     Naloxone HCl 0.4 MG/0.4ML Soaj     naproxen 500 MG tablet  Commonly known as:  NAPROSYN  oxyCODONE 5 MG immediate release tablet  Commonly known as:  Oxy IR/ROXICODONE     potassium chloride 10 MEQ tablet  Commonly known as:  K-DUR     traMADol 50 MG tablet  Commonly known as:  ULTRAM      TAKE these medications      Indication   hydrOXYzine 25 MG tablet  Commonly known as:  ATARAX/VISTARIL  Take 1 tablet (25 mg total) by mouth every 6 (six) hours as needed for anxiety.   Indication:  Anxiety Neurosis     mirtazapine 15 MG tablet  Commonly known as:  REMERON  Take 1 tablet (15 mg total) by mouth at bedtime.   Indication:  Trouble Sleeping, Major Depressive Disorder     nicotine 21 mg/24hr patch  Commonly known as:  NICODERM CQ - dosed in mg/24 hours  Place 1 patch (21 mg total) onto the skin daily.    Indication:  Nicotine Addiction           Follow-up Information    [facility] with [facility] [place] On 05/17/2016.   Why:  Appt on this date at 9:00AM for hospital follow-up/medication management/assessment for counseling services. Please bring DL/Picture ID, Social Security card with you to this appt.    Contact information:   [address] [place], [place] [address] Phone: [phone] Fax: [phone]      Follow-up recommendations:  Activity:  as tol Diet:  as tol  Comments:  1.  Take all your medications as prescribed.   2.  Report any adverse side effects to outpatient provider. 3.  Patient instructed to not use alcohol or illegal drugs while on prescription medicines. 4.  In the event of worsening symptoms, instructed patient to call 911, the crisis hotline or go to nearest emergency room for evaluation of symptoms.  Signed: Lindwood QuaSheila May Agustin, NP [facility] 05/15/2016, 9:45 AM   Patient seen, Suicide Assessment Completed.  Disposition Plan Reviewed

## 2016-05-15 NOTE — Progress Notes (Addendum)
  [facility] Dis[facility] :  Will you be returning to the same living situation after discharge:  Yes,  patient plans to return to previous living situation At discharge, do you have transportation home?: Yes,  friends/family Do you have the ability to pay for your medications: Yes,  patient will be provided with prescriptions at discharge  Release of information consent forms completed and in the chart;  Patient's signature needed at discharge.  Patient to Follow up at: Follow-up Information    Follow up with [facility] On 05/17/2016.   Why:  Appt on this date at 9:00AM for hospital follow-up/medication management/assessment for counseling services. Please bring DL/Picture ID, Social Security card with you to this appt.    Contact information:   [address] [place], [place] [address] Phone: [phone] Fax: [phone]      Next level of care provider has [facility] Link:no  Safety Planning and Suicide Prevention discussed: Yes,  with patient and mother  Have you used any form of tobacco in the last 30 days? (Cigarettes, Smokeless Tobacco, Cigars, and/or Pipes): Yes  Has patient been referred to the Quitline?: Patient refused referral  Patient has been referred for addiction treatment: Yes  Heidi [name], [place] CarboKristin L 05/15/2016, 9:41 AM

## 2016-05-15 NOTE — [facility] Suicide Risk Assessment (Signed)
[facility] INPATIENT:  Family/Significant Other Suicide Prevention Education  Suicide Prevention Education:  Education Completed; Dennison Mascotheresa Yates (pt's mother) [phone],  (name of family member/significant other) has been identified by the patient as the family member/significant other with whom the patient will be residing, and identified as the person(s) who will aid the patient in the event of a mental health crisis (suicidal ideations/suicide attempt).  With written consent from the patient, the family member/significant other has been provided the following suicide prevention education, prior to the and/or following the discharge of the patient.  The suicide prevention education provided includes the following:  Suicide risk factors  Suicide prevention and interventions  National Suicide Hotline telephone number  [facility] assessment telephone number  [facility] Assistance 911  [facility] and/or Residential Mobile Crisis Unit telephone number  Request made of family/significant other to:  Remove weapons (e.g., guns, rifles, knives), all items previously/currently identified as safety concern.    Remove drugs/medications (over-the-counter, prescriptions, illicit drugs), all items previously/currently identified as a safety concern.  The family member/significant other verbalizes understanding of the suicide prevention education information provided.  The family member/significant other agrees to remove the items of safety concern listed above.  Smokey Melott, [place] CarboKristin L 05/15/2016, 10:25 AM

## 2016-09-06 ENCOUNTER — Observation Stay (HOSPITAL_COMMUNITY)
Admission: EM | Admit: 2016-09-06 | Discharge: 2016-09-07 | Disposition: A | Discharge status: Home / Self Care | Payer: Self-pay | Attending: Internal Medicine | Admitting: Internal Medicine

## 2016-09-06 ENCOUNTER — Encounter (HOSPITAL_COMMUNITY): Payer: Self-pay

## 2016-09-06 ENCOUNTER — Emergency Department (HOSPITAL_COMMUNITY): Payer: Self-pay

## 2016-09-06 DIAGNOSIS — D649 Anemia, unspecified: Secondary | ICD-10-CM | POA: Insufficient documentation

## 2016-09-06 DIAGNOSIS — R401 Stupor: Principal | ICD-10-CM | POA: Insufficient documentation

## 2016-09-06 DIAGNOSIS — F1910 Other psychoactive substance abuse, uncomplicated: Secondary | ICD-10-CM

## 2016-09-06 DIAGNOSIS — E876 Hypokalemia: Secondary | ICD-10-CM | POA: Insufficient documentation

## 2016-09-06 DIAGNOSIS — N179 Acute kidney failure, unspecified: Secondary | ICD-10-CM | POA: Insufficient documentation

## 2016-09-06 DIAGNOSIS — R4182 Altered mental status, unspecified: Secondary | ICD-10-CM

## 2016-09-06 DIAGNOSIS — F17210 Nicotine dependence, cigarettes, uncomplicated: Secondary | ICD-10-CM | POA: Insufficient documentation

## 2016-09-06 LAB — URINALYSIS, ROUTINE W REFLEX MICROSCOPIC
GLUCOSE, UA: NEGATIVE mg/dL
KETONES UR: 40 mg/dL — AB
NITRITE: NEGATIVE
PH: 6 (ref 5.0–8.0)
Protein, ur: 30 mg/dL — AB
SPECIFIC GRAVITY, URINE: 1.025 (ref 1.005–1.030)

## 2016-09-06 LAB — RAPID URINE DRUG SCREEN, HOSP PERFORMED
Amphetamines: POSITIVE — AB
BARBITURATES: NOT DETECTED
Benzodiazepines: POSITIVE — AB
COCAINE: POSITIVE — AB
Opiates: POSITIVE — AB
TETRAHYDROCANNABINOL: POSITIVE — AB

## 2016-09-06 LAB — HEPATIC FUNCTION PANEL
ALT: 23 U/L (ref 14–54)
AST: 28 U/L (ref 15–41)
Albumin: 4 g/dL (ref 3.5–5.0)
Alkaline Phosphatase: 54 U/L (ref 38–126)
BILIRUBIN DIRECT: 0.2 mg/dL (ref 0.1–0.5)
BILIRUBIN INDIRECT: 0.8 mg/dL (ref 0.3–0.9)
BILIRUBIN TOTAL: 1 mg/dL (ref 0.3–1.2)
Total Protein: 7.5 g/dL (ref 6.5–8.1)

## 2016-09-06 LAB — CBC
HCT: 34.7 % — ABNORMAL LOW (ref 36.0–46.0)
HEMOGLOBIN: 11.3 g/dL — AB (ref 12.0–15.0)
MCH: 27 pg (ref 26.0–34.0)
MCHC: 32.6 g/dL (ref 30.0–36.0)
MCV: 83 fL (ref 78.0–100.0)
PLATELETS: 325 10*3/uL (ref 150–400)
RBC: 4.18 MIL/uL (ref 3.87–5.11)
RDW: 13.1 % (ref 11.5–15.5)
WBC: 7.9 10*3/uL (ref 4.0–10.5)

## 2016-09-06 LAB — BASIC METABOLIC PANEL
ANION GAP: 3 — AB (ref 5–15)
BUN: 17 mg/dL (ref 6–20)
CALCIUM: 8.9 mg/dL (ref 8.9–10.3)
CO2: 24 mmol/L (ref 22–32)
CREATININE: 1.23 mg/dL — AB (ref 0.44–1.00)
Chloride: 110 mmol/L (ref 101–111)
GFR, EST NON AFRICAN AMERICAN: 59 mL/min — AB (ref >60)
GLUCOSE: 90 mg/dL (ref 65–99)
Potassium: 2.9 mmol/L — ABNORMAL LOW (ref 3.5–5.1)
Sodium: 137 mmol/L (ref 135–145)

## 2016-09-06 LAB — I-STAT TROPONIN, ED: TROPONIN I, POC: 0 ng/mL (ref 0.00–0.08)

## 2016-09-06 LAB — URINE MICROSCOPIC-ADD ON

## 2016-09-06 LAB — MAGNESIUM: Magnesium: 1.6 mg/dL — ABNORMAL LOW (ref 1.7–2.4)

## 2016-09-06 LAB — PREGNANCY, URINE: PREG TEST UR: NEGATIVE

## 2016-09-06 LAB — TSH: TSH: 0.091 u[IU]/mL — AB (ref 0.350–4.500)

## 2016-09-06 LAB — ETHANOL

## 2016-09-06 LAB — ACETAMINOPHEN LEVEL: Acetaminophen (Tylenol), Serum: <10 ug/mL — ABNORMAL LOW (ref 10–30)

## 2016-09-06 LAB — SALICYLATE LEVEL: Salicylate Lvl: <4 mg/dL (ref 2.8–30.0)

## 2016-09-06 LAB — LIPASE, BLOOD: Lipase: 10 U/L — ABNORMAL LOW (ref 11–51)

## 2016-09-06 MED ORDER — POLYETHYLENE GLYCOL 3350 17 G PO PACK: 17.0000 g | PACK | Freq: Every day | ORAL | Status: DC | PRN

## 2016-09-06 MED ORDER — LORAZEPAM 2 MG/ML IJ SOLN
1.0000 mg | Freq: Once | INTRAMUSCULAR | Status: AC
  Administered 2016-09-06: 1 mg via INTRAVENOUS
  Filled 2016-09-06: qty 1

## 2016-09-06 MED ORDER — POTASSIUM CHLORIDE 10 MEQ/100ML IV SOLN
10.0000 meq | INTRAVENOUS | Status: AC
  Administered 2016-09-06: 10 meq via INTRAVENOUS
  Filled 2016-09-06: qty 100

## 2016-09-06 MED ORDER — SODIUM CHLORIDE 0.9 % IV SOLN: INTRAVENOUS | Status: DC

## 2016-09-06 MED ORDER — SODIUM CHLORIDE 0.9 % IV SOLN
INTRAVENOUS | Status: AC
  Administered 2016-09-06: 21:00:00 via INTRAVENOUS

## 2016-09-06 MED ORDER — NALOXONE HCL 2 MG/2ML IJ SOSY
1.0000 mg | PREFILLED_SYRINGE | Freq: Once | INTRAMUSCULAR | Status: AC
  Administered 2016-09-06: 1 mg via INTRAVENOUS
  Filled 2016-09-06: qty 2

## 2016-09-06 MED ORDER — SODIUM CHLORIDE 0.9 % IV SOLN
INTRAVENOUS | Status: DC
  Administered 2016-09-06: 20:00:00 via INTRAVENOUS

## 2016-09-06 MED ORDER — BISACODYL 10 MG RE SUPP: 10.0000 mg | Freq: Every day | RECTAL | Status: DC | PRN

## 2016-09-06 MED ORDER — MAGNESIUM SULFATE 2 GM/50ML IV SOLN
2.0000 g | Freq: Once | INTRAVENOUS | Status: AC
  Administered 2016-09-06: 2 g via INTRAVENOUS
  Filled 2016-09-06: qty 50

## 2016-09-06 MED ORDER — HEPARIN SODIUM (PORCINE) 5000 UNIT/ML IJ SOLN
5000.0000 [IU] | Freq: Three times a day (TID) | INTRAMUSCULAR | Status: DC
  Administered 2016-09-06 – 2016-09-07 (×2): 5000 [IU] via SUBCUTANEOUS
  Filled 2016-09-06 (×2): qty 1

## 2016-09-06 MED ORDER — POTASSIUM CHLORIDE CRYS ER 20 MEQ PO TBCR
40.0000 meq | EXTENDED_RELEASE_TABLET | Freq: Once | ORAL | Status: DC
  Filled 2016-09-06: qty 2

## 2016-09-06 MED ORDER — POTASSIUM CHLORIDE 10 MEQ/100ML IV SOLN
10.0000 meq | Freq: Once | INTRAVENOUS | Status: AC
  Administered 2016-09-06: 10 meq via INTRAVENOUS
  Filled 2016-09-06: qty 100

## 2016-09-06 MED ORDER — ONDANSETRON HCL 4 MG/2ML IJ SOLN: 4.0000 mg | Freq: Four times a day (QID) | INTRAMUSCULAR | Status: DC | PRN

## 2016-09-06 MED ORDER — NICOTINE 21 MG/24HR TD PT24
21.0000 mg | MEDICATED_PATCH | Freq: Every day | TRANSDERMAL | Status: DC
  Filled 2016-09-06: qty 1

## 2016-09-06 MED ORDER — SODIUM CHLORIDE 0.9 % IV BOLUS (SEPSIS)
500.0000 mL | Freq: Once | INTRAVENOUS | Status: AC
  Administered 2016-09-06: 500 mL via INTRAVENOUS

## 2016-09-06 MED ORDER — LORAZEPAM 2 MG/ML IJ SOLN: 1.0000 mg | INTRAMUSCULAR | Status: DC | PRN

## 2016-09-06 MED ORDER — SODIUM CHLORIDE 0.9% FLUSH
3.0000 mL | Freq: Two times a day (BID) | INTRAVENOUS | Status: DC
Start: 2016-09-06 — End: 2016-09-07

## 2016-09-06 MED ORDER — ONDANSETRON HCL 4 MG PO TABS: 4.0000 mg | ORAL_TABLET | Freq: Four times a day (QID) | ORAL | Status: DC | PRN

## 2016-09-06 MED ORDER — NALOXONE HCL 0.4 MG/ML IJ SOLN
0.4000 mg | Freq: Once | INTRAMUSCULAR | Status: AC
  Administered 2016-09-06: 0.4 mg via INTRAVENOUS
  Filled 2016-09-06: qty 1

## 2016-09-06 NOTE — ED Notes (Signed)
Patient stated to tech Anabel Halonori Hutchens, CNA while in restroom that Meth was the substance in the needle.

## 2016-09-06 NOTE — ED Notes (Signed)
Patient difficult to arouse with sternal rub. Dr. Clarene DukeMcManus made aware.

## 2016-09-06 NOTE — ED Notes (Signed)
Patient transported to X-ray 

## 2016-09-06 NOTE — ED Notes (Signed)
Syringe and needle found in bed with patient with clear liquid noted in syringe. Patient initially stated that her friend is a diabetic and she had the needle that he friend used. Then stated she did Meth 2 days ago and the needle may be from that.

## 2016-09-06 NOTE — ED Notes (Signed)
Patient will not answer questions or follow commands. When patients name called patient will open eyes for approx. 2 seconds then close again. MD made aware.

## 2016-09-06 NOTE — ED Provider Notes (Signed)
[facility] DEPT Provider Note   CSN: [id number] Arrival date & time: 09/06/16  1422     History   Chief Complaint Chief Complaint  Patient presents with  . Chest Pain  . Abdominal Pain    HPI Heidi [name] is a 30 y.o. female.   Chest Pain   Associated symptoms include abdominal pain.  Abdominal Pain      Pt was seen at 1450.  Per pt, c/o gradual onset and persistence of multiple intermittent episodes of RUQ abd "pain" for the past 3 weeks. Has been associated with multiple intermittent episodes of N/V.  Describes the abd pain as "sharp."  Unclear if pain is related to food. States her symptoms began "when I was in jail." Denies diarrhea, no fevers, no back pain, no rash, no CP/SOB, no black or blood in stools or emesis.  LD marijuana and meth "a couple of days ago." Pt requesting "something for my nerves" on arrival to ED.     Past Medical History:  Diagnosis Date  . Abnormal Pap smear of vagina   . Anxiety   . Chronic shoulder pain   . Drug use   . Hepatitis C   . Polysubstance abuse     Patient Active Problem List   Diagnosis Date Noted  . Substance induced mood disorder (HCC) 05/12/2016  . Elevated LFTs 06/14/2014  . Hepatitis C, acute 06/05/2014  . Polysubstance abuse 06/04/2014  . Acute hepatitis 06/03/2014  . Cholestatic jaundice 06/03/2014  . Cystitis, acute 06/03/2014  . Chronic pain syndrome 06/03/2014  . Hypokalemia 06/03/2014  . CLOSED FRACTURE OF MEDIAL MALLEOLUS 06/20/2010  . ANKLE SPRAIN 01/07/2008    Past Surgical History:  Procedure Laterality Date  . ANKLE SURGERY         Home Medications    Prior to Admission medications   Medication Sig Start Date End Date Taking? Authorizing Provider  hydrOXYzine (ATARAX/VISTARIL) 25 MG tablet Take 1 tablet (25 mg total) by mouth every 6 (six) hours as needed for anxiety. 05/15/16   Adonis Brook, NP  mirtazapine (REMERON) 15 MG tablet Take 1 tablet (15 mg total) by mouth at bedtime. 05/15/16    Adonis Brook, NP  nicotine (NICODERM CQ - DOSED IN MG/24 HOURS) 21 mg/24hr patch Place 1 patch (21 mg total) onto the skin daily. 05/15/16   Adonis Brook, NP    Family History Family History  Problem Relation Age of Onset  . Cirrhosis Father   . Hepatitis C Father     Social History Social History  Substance Use Topics  . Smoking status: Current Every Day Smoker    Packs/day: 1.00    Types: Cigarettes  . Smokeless tobacco: Never Used  . Alcohol use Yes     Allergies   Penicillins; Tylenol [acetaminophen]; Venlafaxine; and Morphine and related   Review of Systems Review of Systems  Cardiovascular: Positive for chest pain.  Gastrointestinal: Positive for abdominal pain.  ROS: Statement: All systems negative except as marked or noted in the HPI; Constitutional: Negative for fever and chills. ; ; Eyes: Negative for eye pain, redness and discharge. ; ; ENMT: Negative for ear pain, hoarseness, nasal congestion, sinus pressure and sore throat. ; ; Cardiovascular: Negative for chest pain, palpitations, diaphoresis, dyspnea and peripheral edema. ; ; Respiratory: Negative for cough, wheezing and stridor. ; ; Gastrointestinal: +N/V, abd pain. Negative for diarrhea, blood in stool, hematemesis, jaundice and rectal bleeding. . ; ; Genitourinary: Negative for dysuria, flank pain and hematuria. ; ;  Musculoskeletal: Negative for back pain and neck pain. Negative for swelling and trauma.; ; Skin: Negative for pruritus, rash, abrasions, blisters, bruising and skin lesion.; ; Neuro: Negative for headache, lightheadedness and neck stiffness. Negative for weakness, altered level of consciousness, altered mental status, extremity weakness, paresthesias, involuntary movement, seizure and syncope.        Physical Exam Updated Vital Signs BP 120/65 (BP Location: Left Arm)   Pulse 112   Temp 98.9 F (37.2 C) (Oral)   Resp 18   Ht 5\' 7"  (1.702 m)   Wt 130 lb (59 kg)   LMP  (LMP Unknown)   SpO2  100%   BMI 20.36 kg/m   Physical Exam 1455: Physical examination:  Nursing notes reviewed; Vital signs and O2 SAT reviewed;  Constitutional: Well developed, Well nourished, Well hydrated, In no acute distress; Head:  Normocephalic, atraumatic; Eyes: EOMI, PERRL, No scleral icterus; ENMT: Mouth and pharynx normal, Mucous membranes moist; Neck: Supple, Full range of motion, No lymphadenopathy; Cardiovascular: Tachycardic rate and rhythm, No gallop; Respiratory: Breath sounds clear & equal bilaterally, No wheezes.  Speaking full sentences with ease, Normal respiratory effort/excursion; Chest: Nontender, Movement normal; Abdomen: Soft, +RUQ and mid-epigastric tenderness to palp. No rebound or guarding. Nondistended, Normal bowel sounds; Genitourinary: No CVA tenderness; Extremities: Pulses normal, No tenderness, No edema, No calf edema or asymmetry.; Neuro: AA&Ox3, Major CN grossly intact.  Speech clear. No gross focal motor or sensory deficits in extremities. Climbs on and off stretcher easily by herself. Gait steady.; Skin: Color normal, Warm, Dry.   ED Treatments / Results  Labs (all labs ordered are listed, but only abnormal results are displayed)   EKG  EKG Interpretation  Date/Time:  Friday September 06 2016 14:37:35 EDT Ventricular Rate:  110 PR Interval:  146 QRS Duration: 82 QT Interval:  350 QTC Calculation: 473 R Axis:   95 Text Interpretation:   Suspect arm lead reversal, interpretation assumes no reversal Sinus tachycardia Lateral infarct , age undetermined Abnormal ECG No significant change was found Confirmed by Manus Gunning  MD, STEPHEN [phone]) on 09/06/2016 2:51:57 PM       EKG Interpretation  Date/Time:  Friday September 06 2016 16:54:15 EDT Ventricular Rate:  87 PR Interval:  154 QRS Duration: 90 QT Interval:  428 QTC Calculation: 515 R Axis:   87 Text Interpretation:  Normal sinus rhythm Possible Left atrial enlargement Nonspecific ST abnormality Prolonged QT When  compared with ECG of 05/09/2016 No significant change was found Confirmed by [facility]  MD, Nicholos Johns [phone]) on 09/06/2016 4:57:26 PM         Radiology   Procedures Procedures (including critical care time)  Medications Ordered in ED Medications  0.9 %  sodium chloride infusion (not administered)  sodium chloride 0.9 % bolus 500 mL (500 mLs Intravenous New Bag/Given 09/06/16 1521)  LORazepam (ATIVAN) injection 1 mg (1 mg Intravenous Given 09/06/16 1521)     Initial Impression / Assessment and Plan / ED Course  I have reviewed the triage vital signs and the nursing notes.  Pertinent labs & imaging results that were available during my care of the patient were reviewed by me and considered in my medical decision making (see chart for details).  MDM Reviewed: previous chart, nursing note and vitals Reviewed previous: labs and ECG Interpretation: labs, ECG, x-ray and ultrasound   Results for orders placed or performed during the hospital encounter of 09/06/16  Basic metabolic panel  Result Value Ref Range   Sodium 137 135 -  145 mmol/L   Potassium 2.9 (L) 3.5 - 5.1 mmol/L   Chloride 110 101 - 111 mmol/L   CO2 24 22 - 32 mmol/L   Glucose, Bld 90 65 - 99 mg/dL   BUN 17 6 - 20 mg/dL   Creatinine, Ser 1.61 (H) 0.44 - 1.00 mg/dL   Calcium 8.9 8.9 - 09.6 mg/dL   GFR calc non Af Amer 59 (L) >60 mL/min   GFR calc Af Amer >60 >60 mL/min   Anion gap 3 (L) 5 - 15  CBC  Result Value Ref Range   WBC 7.9 4.0 - 10.5 K/uL   RBC 4.18 3.87 - 5.11 MIL/uL   Hemoglobin 11.3 (L) 12.0 - 15.0 g/dL   HCT 04.5 (L) 40.9 - 81.1 %   MCV 83.0 78.0 - 100.0 fL   MCH 27.0 26.0 - 34.0 pg   MCHC 32.6 30.0 - 36.0 g/dL   RDW 91.4 78.2 - 95.6 %   Platelets 325 150 - 400 K/uL  Hepatic function panel  Result Value Ref Range   Total Protein 7.5 6.5 - 8.1 g/dL   Albumin 4.0 3.5 - 5.0 g/dL   AST 28 15 - 41 U/L   ALT 23 14 - 54 U/L   Alkaline Phosphatase 54 38 - 126 U/L   Total Bilirubin 1.0 0.3 - 1.2 mg/dL    Bilirubin, Direct 0.2 0.1 - 0.5 mg/dL   Indirect Bilirubin 0.8 0.3 - 0.9 mg/dL  Lipase, blood  Result Value Ref Range   Lipase 10 (L) 11 - 51 U/L  Urine rapid drug screen (hosp performed)  Result Value Ref Range   Opiates POSITIVE (A) NONE DETECTED   Cocaine POSITIVE (A) NONE DETECTED   Benzodiazepines POSITIVE (A) NONE DETECTED   Amphetamines POSITIVE (A) NONE DETECTED   Tetrahydrocannabinol POSITIVE (A) NONE DETECTED   Barbiturates NONE DETECTED NONE DETECTED  Pregnancy, urine  Result Value Ref Range   Preg Test, Ur NEGATIVE NEGATIVE  Ethanol  Result Value Ref Range   Alcohol, Ethyl (B) <5 <5 mg/dL  I-stat troponin, ED  Result Value Ref Range   Troponin i, poc 0.00 0.00 - 0.08 ng/mL   Comment 3           Dg Chest 2 View Result Date: 09/06/2016 CLINICAL DATA:  Chest and abdominal pain. EXAM: CHEST  2 VIEW COMPARISON:  04/15/2016. FINDINGS: Mediastinum hilar structures are normal. Lungs are clear. No pleural effusion or pneumothorax. Degenerative changes thoracic spine. IMPRESSION: No acute cardiopulmonary disease. Electronically Signed   By: Maisie Fus  Register   On: 09/06/2016 15:46   US Abdomen Complete Result Date: 09/06/2016 CLINICAL DATA:  Three-week history of upper abdominal pain. History of hepatitis-C EXAM: ABDOMEN ULTRASOUND COMPLETE COMPARISON:  CT abdomen and pelvis October 26, 2015 FINDINGS: Gallbladder: No gallstones or wall thickening visualized. There is no pericholecystic fluid. No sonographic Murphy sign noted by sonographer. Common bile duct: Diameter: 5 mm. There is no intrahepatic, common hepatic, or common bile duct dilatation. Liver: No focal lesion identified. Within normal limits in parenchymal echogenicity. Flow in the portal vein is in the anatomic direction. IVC: No abnormality visualized. Pancreas: No mass or inflammatory focus. Spleen: Spleen measures 13.3 x 14.6 x 6.7 cm with a measured splenic volume of 683 cubic cm. No focal splenic lesions are evident.  Right Kidney: Length: 12.0 cm. Echogenicity within normal limits. No mass or hydronephrosis visualized. Left Kidney: Length: 12.9 cm. Echogenicity within normal limits. No mass or hydronephrosis visualized. Abdominal aorta: No  aneurysm visualized. Other findings: No demonstrable ascites. IMPRESSION: Splenomegaly, a finding also evident on prior CT. Study otherwise unremarkable. Electronically Signed   By: Bretta BangWilliam  Woodruff III M.D.   On: 09/06/2016 17:07     1455:  While pt was ambulating to the bathroom a needle with clear liquid fell out of her shirt. Pt initially told staff she "was holding it for a diabetic friend" then admitted needle was from her LD meth. Pt insistent to me she "doesn't do drugs anymore," but "maybe meth and some pot but not opiates anymore because I'm clean." Pt requesting "something for my nerves." Will dose ativan.  1820:  Pt returned from [facility] lethargic. Pt given IV narcan x2 without significant/sustained response. Pt's clothing removed; only what appears to be some type of candy found on person, no further syringes, no pills. The "friend" that brought pt to the ED (along with another person), now admits that "she might have been using opana" before he brought them here. Cannot, or will not, tell me if it was extended release or if it was cut with any other drugs. Will observation admit. Pt's VS remain stable, airway clear, resps easy, moves extremities on stretcher spontaneously.    1830:  T/C to [facility] Dr. Antionette Charpyd, case discussed, including:  HPI, pertinent PM/SHx, VS/PE, dx testing, ED course and treatment:  Agreeable to admit, requests to write temporary orders, obtain stepdown bed to team APAdmits.    Final Clinical Impressions(s) / ED Diagnoses   Final diagnoses:  None    New Prescriptions New Prescriptions   No medications on file      Samuel JesterKathleen Dereonna Lensing, DO 09/08/16 2113

## 2016-09-06 NOTE — H&P (Signed)
History and Physical    Heidi [name] WGN:[id number] DOB: 09-23-1986 DOA: 09/06/2016  PCP: No PCP Per Patient   Patient coming from: Home  Chief Complaint: AMS   HPI: Heidi [name] is a 30 y.o. female with medical history significant for polysubstance abuse and viral hepatitis who presents to the emergency department with decreased level of consciousness. Patient was reportedly brought into the ED by a friend who was concerned for her somnolence. History was initially quite difficult to glean as the patient was unable to contribute much due to her clinical condition. Eventually, the patient's friend noted that they had injected Opana earlier in the day, and another friend had mentioned that they had been injecting methamphetamine as well. Patient initially denied all of this, but a syringe with clear fluid reportedly fell out of her clothing and she was being assisted to the bathroom and she admitted that this was methamphetamine. Patient has not voiced any specific complaints.   ED Course: Upon arrival to the ED, patient is found to be afebrile, saturating well on room air, tachycardic in the 110s, no stable blood pressure. EKG demonstrates normal sinus rhythm with nonspecific ST abnormality and QT interval of 515 ms. Chest x-ray is negative for acute cardiopulmonary disease and CMP is notable for potassium of 2.9, and serum creatinine 1.23, up from 0.66 in May of this year. CBC is notable for a normocytic anemia with hemoglobin of 11.3. Troponin is undetectable and lipase level is low. UDS is positive for amphetamines, benzodiazepines, cocaine, opiates, and THC. Patient was given Narcan in the emergency department with improvement in her responsiveness. She has remained hemodynamically stable and in no apparent respiratory distress. She'll be observed in the stepdown unit for ongoing evaluation and management of obtundation suspected secondary to polysubstance abuse.  Review of Systems: Unable to  obtain secondary to the patient's clinical condition with stupor.  Past Medical History:  Diagnosis Date  . Abnormal Pap smear of vagina   . Anxiety   . Chronic shoulder pain   . Drug use   . Hepatitis C   . Polysubstance abuse     Past Surgical History:  Procedure Laterality Date  . ANKLE SURGERY       reports that she has been smoking Cigarettes.  She has been smoking about 1.00 pack per day. She has never used smokeless tobacco. She reports that she drinks alcohol. She reports that she uses drugs, including Heroin, Marijuana, and Methamphetamines.  Allergies  Allergen Reactions  . Penicillins Anaphylaxis, Hives and Other (See Comments)    Has patient had a PCN reaction causing immediate rash, facial/tongue/throat swelling, SOB or lightheadedness with hypotension: Yes Has patient had a PCN reaction causing severe rash involving mucus membranes or skin necrosis: No Has patient had a PCN reaction that required hospitalization No Has patient had a PCN reaction occurring within the last 10 years: No If all of the above answers are "NO", then may proceed with Cephalosporin use.  headaches  . Tylenol [Acetaminophen] Hives  . Venlafaxine Hives and Other (See Comments)    Headaches    . Morphine And Related Hives and Other (See Comments)    Reaction: headaches    Family History  Problem Relation Age of Onset  . Cirrhosis Father   . Hepatitis C Father      Prior to Admission medications   Medication Sig Start Date End Date Taking? Authorizing Provider  hydrOXYzine (ATARAX/VISTARIL) 25 MG tablet Take 1 tablet (25 mg total) by mouth  every 6 (six) hours as needed for anxiety. 05/15/16   Adonis Brook, NP  mirtazapine (REMERON) 15 MG tablet Take 1 tablet (15 mg total) by mouth at bedtime. 05/15/16   Adonis Brook, NP  nicotine (NICODERM CQ - DOSED IN MG/24 HOURS) 21 mg/24hr patch Place 1 patch (21 mg total) onto the skin daily. 05/15/16   Adonis Brook, NP    Physical  Exam: Vitals:   09/06/16 1900 09/06/16 1930 09/06/16 1940 09/06/16 2000  BP: 94/55 91/62 96/63  96/62  Pulse: 79 80 92 78  Resp: 15 16 14 15   Temp:   97.9 F (36.6 C)   TempSrc:   Oral   SpO2: 99% 99% 100% 99%  Weight:      Height:          Constitutional: NAD, calm, comfortable Eyes: PERTLA, lids and conjunctivae normal ENMT: Mucous membranes are moist. Posterior pharynx clear of any exudate or lesions.   Neck: normal, supple, no masses, no thyromegaly Respiratory: clear to auscultation bilaterally, no wheezing, no crackles. Normal respiratory effort.  .  Cardiovascular: S1 & S2 heard, regular rate and rhythm, no significant murmur. No extremity edema. 2+ pedal pulses. No carotid bruits. No significant JVD. Abdomen: No distension, no tenderness, no masses palpated. Bowel sounds normal.  Musculoskeletal: no clubbing / cyanosis. No joint deformity upper and lower extremities. Normal muscle tone.  Skin: no significant rashes, lesions, ulcers. Warm, dry, well-perfused. Neurologic: CN 2-12 grossly intact. Awakes briefly to voice and makes eye-contact before returning to sleep. Moving all extremities spontaneously, Babinski down-going b/l. Patellar DTR's wnl.   Psychiatric: Unable to assess given the clinical condition.     Labs on Admission: I have personally reviewed following labs and imaging studies  CBC:  Recent Labs Lab 09/06/16 1429  WBC 7.9  HGB 11.3*  HCT 34.7*  MCV 83.0  PLT 325   Basic Metabolic Panel:  Recent Labs Lab 09/06/16 1429  NA 137  K 2.9*  CL 110  CO2 24  GLUCOSE 90  BUN 17  CREATININE 1.23*  CALCIUM 8.9   GFR: Estimated Creatinine Clearance: 63.4 mL/min (by C-G formula based on SCr of 1.23 mg/dL (H)). Liver Function Tests:  Recent Labs Lab 09/06/16 1429  AST 28  ALT 23  ALKPHOS 54  BILITOT 1.0  PROT 7.5  ALBUMIN 4.0    Recent Labs Lab 09/06/16 1429  LIPASE 10*   No results for input(s): AMMONIA in the last 168  hours. Coagulation Profile: No results for input(s): INR, PROTIME in the last 168 hours. Cardiac Enzymes: No results for input(s): CKTOTAL, CKMB, CKMBINDEX, TROPONINI in the last 168 hours. BNP (last 3 results) No results for input(s): PROBNP in the last 8760 hours. HbA1C: No results for input(s): HGBA1C in the last 72 hours. CBG: No results for input(s): GLUCAP in the last 168 hours. Lipid Profile: No results for input(s): CHOL, HDL, LDLCALC, TRIG, CHOLHDL, LDLDIRECT in the last 72 hours. Thyroid Function Tests: No results for input(s): TSH, T4TOTAL, FREET4, T3FREE, THYROIDAB in the last 72 hours. Anemia Panel: No results for input(s): VITAMINB12, FOLATE, FERRITIN, TIBC, IRON, RETICCTPCT in the last 72 hours. Urine analysis:    Component Value Date/Time   COLORURINE AMBER (A) 09/06/2016 1501   APPEARANCEUR CLOUDY (A) 09/06/2016 1501   LABSPEC 1.025 09/06/2016 1501   PHURINE 6.0 09/06/2016 1501   GLUCOSEU NEGATIVE 09/06/2016 1501   HGBUR LARGE (A) 09/06/2016 1501   BILIRUBINUR SMALL (A) 09/06/2016 1501   KETONESUR 40 (A) 09/06/2016 1501  PROTEINUR 30 (A) 09/06/2016 1501   UROBILINOGEN 0.2 07/26/2015 0156   NITRITE NEGATIVE 09/06/2016 1501   LEUKOCYTESUR TRACE (A) 09/06/2016 1501   Sepsis Labs: @LABRCNTIP (procalcitonin:4,lacticidven:4) )No results found for this or any previous visit (from the past 240 hour(s)).   Radiological Exams on Admission: Dg Chest 2 View  Result Date: 09/06/2016 CLINICAL DATA:  Chest and abdominal pain. EXAM: CHEST  2 VIEW COMPARISON:  04/15/2016. FINDINGS: Mediastinum hilar structures are normal. Lungs are clear. No pleural effusion or pneumothorax. Degenerative changes thoracic spine. IMPRESSION: No acute cardiopulmonary disease. Electronically Signed   By: Maisie Fus  Register   On: 09/06/2016 15:46   US Abdomen Complete  Result Date: 09/06/2016 CLINICAL DATA:  Three-week history of upper abdominal pain. History of hepatitis-C EXAM: ABDOMEN  ULTRASOUND COMPLETE COMPARISON:  CT abdomen and pelvis October 26, 2015 FINDINGS: Gallbladder: No gallstones or wall thickening visualized. There is no pericholecystic fluid. No sonographic Murphy sign noted by sonographer. Common bile duct: Diameter: 5 mm. There is no intrahepatic, common hepatic, or common bile duct dilatation. Liver: No focal lesion identified. Within normal limits in parenchymal echogenicity. Flow in the portal vein is in the anatomic direction. IVC: No abnormality visualized. Pancreas: No mass or inflammatory focus. Spleen: Spleen measures 13.3 x 14.6 x 6.7 cm with a measured splenic volume of 683 cubic cm. No focal splenic lesions are evident. Right Kidney: Length: 12.0 cm. Echogenicity within normal limits. No mass or hydronephrosis visualized. Left Kidney: Length: 12.9 cm. Echogenicity within normal limits. No mass or hydronephrosis visualized. Abdominal aorta: No aneurysm visualized. Other findings: No demonstrable ascites. IMPRESSION: Splenomegaly, a finding also evident on prior CT. Study otherwise unremarkable. Electronically Signed   By: Bretta Bang III M.D.   On: 09/06/2016 17:07    EKG: Independently reviewed. Normal sinus rhythm, non-specific ST-abnormality, QTc 515 ms   Assessment/Plan  1. Altered mental status  - Suspected secondary to substance abuse  - Brought by an associate for poor responsiveness, reported injecting Opana earlier in the day, found to have syringe which she reported to contain methamphetamine  - There is no focal neurologic deficit identified and no evidence for an infectious etiology - Narcan was given in ED with transient improvement  - Anticipate continued improvement with abstinence from illicit substances; if fails to improve as expected, may need to expand the workup    2. AKI  - SCr 1.23 on admission, up from 0.66 in May of this year  - Likely a prerenal azotemia in setting of poor oral intake and apparent dehydration  - 1 L of NS  given in ED, will continue NS infusion overnight at 110 cc/hr  - Avoid nephrotoxins, repeat chem panel in am    3. Hypokalemia  - Serum potassium 2.9 on admission with uncertain etiology  - 10 mEq IV potassium given in ED and another 20 mEq IV potassium given on admission  - Magnesium level added-on and pending; 2 g magnesium given  - Monitoring on telemetry  - Repeat chem panel in ED   4. Polysubstance abuse  - UDS positive for opiate, cocaine, benzodiazepine, amphetamines, and THC  - SW consultation requested    5. Normocytic anemia  - Hgb 11.3 on admission with normal MCV  - Intermittently low in the past - No s/s of active blood-loss, will monitor    DVT prophylaxis: sq heparin  Code Status: Full  Family Communication: Discussed with patient Disposition Plan: Observe in stepdown  Consults called: None Admission status: Observation  Briscoe Deutscherimothy S Opyd, MD [facility] Pager [phone]  If 7PM-7AM, please contact night-coverage [email or web address] Password [email or web address]  09/06/2016, 8:24 PM

## 2016-09-06 NOTE — ED Triage Notes (Signed)
Patient reports of right sided chest/rib pain with nausea. Patient appears drowsy although denies taking illect drugs. States she has felt this way while she was in jail 3 weeks ago. NAD noted.

## 2016-09-07 DIAGNOSIS — F1910 Other psychoactive substance abuse, uncomplicated: Secondary | ICD-10-CM

## 2016-09-07 DIAGNOSIS — R401 Stupor: Secondary | ICD-10-CM

## 2016-09-07 DIAGNOSIS — N179 Acute kidney failure, unspecified: Secondary | ICD-10-CM

## 2016-09-07 LAB — CBC WITH DIFFERENTIAL/PLATELET
Basophils Absolute: 0 10*3/uL (ref 0.0–0.1)
Basophils Relative: 0 %
EOS ABS: 0.1 10*3/uL (ref 0.0–0.7)
EOS PCT: 3 %
HCT: 32.8 % — ABNORMAL LOW (ref 36.0–46.0)
Hemoglobin: 10.5 g/dL — ABNORMAL LOW (ref 12.0–15.0)
LYMPHS ABS: 1.8 10*3/uL (ref 0.7–4.0)
Lymphocytes Relative: 37 %
MCH: 26.9 pg (ref 26.0–34.0)
MCHC: 32 g/dL (ref 30.0–36.0)
MCV: 83.9 fL (ref 78.0–100.0)
MONOS PCT: 9 %
Monocytes Absolute: 0.5 10*3/uL (ref 0.1–1.0)
Neutro Abs: 2.6 10*3/uL (ref 1.7–7.7)
Neutrophils Relative %: 51 %
PLATELETS: 261 10*3/uL (ref 150–400)
RBC: 3.91 MIL/uL (ref 3.87–5.11)
RDW: 13.4 % (ref 11.5–15.5)
WBC: 5 10*3/uL (ref 4.0–10.5)

## 2016-09-07 LAB — BASIC METABOLIC PANEL
Anion gap: 6 (ref 5–15)
BUN: 14 mg/dL (ref 6–20)
CALCIUM: 7.9 mg/dL — AB (ref 8.9–10.3)
CO2: 24 mmol/L (ref 22–32)
CREATININE: 0.59 mg/dL (ref 0.44–1.00)
Chloride: 107 mmol/L (ref 101–111)
GFR calc Af Amer: >60 mL/min (ref >60)
GLUCOSE: 117 mg/dL — AB (ref 65–99)
Potassium: 2.9 mmol/L — ABNORMAL LOW (ref 3.5–5.1)
Sodium: 137 mmol/L (ref 135–145)

## 2016-09-07 LAB — GLUCOSE, CAPILLARY: GLUCOSE-CAPILLARY: 107 mg/dL — AB (ref 65–99)

## 2016-09-07 LAB — MRSA PCR SCREENING: MRSA by PCR: POSITIVE — AB

## 2016-09-07 MED ORDER — MUPIROCIN 2 % EX OINT
1.0000 "application " | TOPICAL_OINTMENT | Freq: Two times a day (BID) | CUTANEOUS | Status: DC
  Administered 2016-09-07: 1 via NASAL
  Filled 2016-09-07: qty 22

## 2016-09-07 MED ORDER — POTASSIUM CHLORIDE CRYS ER 20 MEQ PO TBCR
40.0000 meq | EXTENDED_RELEASE_TABLET | Freq: Once | ORAL | Status: AC
  Administered 2016-09-07: 40 meq via ORAL
  Filled 2016-09-07: qty 2

## 2016-09-07 MED ORDER — CHLORHEXIDINE GLUCONATE CLOTH 2 % EX PADS
6.0000 | MEDICATED_PAD | Freq: Every day | CUTANEOUS | Status: DC
  Administered 2016-09-07: 6 via TOPICAL

## 2016-09-07 NOTE — Discharge Summary (Signed)
Physician Discharge Summary  Heidi [name] JXB:[id number] DOB: [birth year] DOA: 09/06/2016  PCP: No PCP Per Patient  Admit date: 09/06/2016 Discharge date: 09/07/2016  Time spent: 45 minutes  Recommendations for Outpatient Follow-up:  -Will be discharged home today at her request; IVC has been rescinded. -Advised to follow up with PCP in 2 weeks.   Discharge Diagnoses:  Principal Problem:   Obtundation Active Problems:   Hypokalemia   Polysubstance abuse   AKI (acute kidney injury) (HCC)   Normocytic anemia   Altered mental status   Discharge Condition: Stable and improved  Filed Weights   09/06/16 1427 09/06/16 2042 09/07/16 0430  Weight: 59 kg (130 lb) 59 kg (130 lb 1.1 oz) 61.5 kg (135 lb 9.3 oz)    History of present illness:  As per Dr. Antionette Char on 9/22: Heidi [name] is a 30 y.o. female with medical history significant for polysubstance abuse and viral hepatitis who presents to the emergency department with decreased level of consciousness. Patient was reportedly brought into the ED by a friend who was concerned for her somnolence. History was initially quite difficult to glean as the patient was unable to contribute much due to her clinical condition. Eventually, the patient's friend noted that they had injected Opana earlier in the day, and another friend had mentioned that they had been injecting methamphetamine as well. Patient initially denied all of this, but a syringe with clear fluid reportedly fell out of her clothing and she was being assisted to the bathroom and she admitted that this was methamphetamine. Patient has not voiced any specific complaints.   ED Course: Upon arrival to the ED, patient is found to be afebrile, saturating well on room air, tachycardic in the 110s, no stable blood pressure. EKG demonstrates normal sinus rhythm with nonspecific ST abnormality and QT interval of 515 ms. Chest x-ray is negative for acute cardiopulmonary disease and CMP is notable  for potassium of 2.9, and serum creatinine 1.23, up from 0.66 in May of this year. CBC is notable for a normocytic anemia with hemoglobin of 11.3. Troponin is undetectable and lipase level is low. UDS is positive for amphetamines, benzodiazepines, cocaine, opiates, and THC. Patient was given Narcan in the emergency department with improvement in her responsiveness. She has remained hemodynamically stable and in no apparent respiratory distress. She'll be observed in the stepdown unit for ongoing evaluation and management of obtundation suspected secondary to polysubstance abuse.  Hospital Course:   Polysubstance Abuse -Admits to use of narcotics and methamphetamines. -States she did not overdose with the intention to harm, but with recreational purposes. -She is currently alert and oriented. -Rehab offered but she has refused. -Will rescind IVC and patient can be discharged home today.  ARF -Resolved with IVF. -Likely due to dehydration and prerenal azotemia.  Procedures:  None   Consultations:  None  Discharge Instructions  Discharge Instructions    Increase activity slowly    Complete by:  As directed        Medication List    STOP taking these medications   hydrOXYzine 25 MG tablet Commonly known as:  ATARAX/VISTARIL   mirtazapine 15 MG tablet Commonly known as:  REMERON   nicotine 21 mg/24hr patch Commonly known as:  NICODERM CQ - dosed in mg/24 hours     TAKE these medications   ibuprofen 200 MG tablet Commonly known as:  ADVIL,MOTRIN Take 400 mg by mouth every 6 (six) hours as needed for moderate pain.   levonorgestrel 20  MCG/24HR IUD Commonly known as:  MIRENA 1 each by Intrauterine route once.      Allergies  Allergen Reactions  . Penicillins Anaphylaxis, Hives and Other (See Comments)    Has patient had a PCN reaction causing immediate rash, facial/tongue/throat swelling, SOB or lightheadedness with hypotension: Yes Has patient had a PCN reaction  causing severe rash involving mucus membranes or skin necrosis: No Has patient had a PCN reaction that required hospitalization No Has patient had a PCN reaction occurring within the last 10 years: No If all of the above answers are "NO", then may proceed with Cephalosporin use.  headaches  . Tylenol [Acetaminophen] Hives  . Venlafaxine Hives and Other (See Comments)    Headaches    . Morphine And Related Hives and Other (See Comments)    Reaction: headaches   Follow-up Information    Primary Care Provider. Schedule an appointment as soon as possible for a visit in 2 week(s).            The results of significant diagnostics from this hospitalization (including imaging, microbiology, ancillary and laboratory) are listed below for reference.    Significant Diagnostic Studies: Dg Chest 2 View  Result Date: 09/06/2016 CLINICAL DATA:  Chest and abdominal pain. EXAM: CHEST  2 VIEW COMPARISON:  04/15/2016. FINDINGS: Mediastinum hilar structures are normal. Lungs are clear. No pleural effusion or pneumothorax. Degenerative changes thoracic spine. IMPRESSION: No acute cardiopulmonary disease. Electronically Signed   By: Maisie Fushomas  Register   On: 09/06/2016 15:46   [place] Abdomen Complete  Result Date: 09/06/2016 CLINICAL DATA:  Three-week history of upper abdominal pain. History of hepatitis-C EXAM: ABDOMEN ULTRASOUND COMPLETE COMPARISON:  CT abdomen and pelvis October 26, 2015 FINDINGS: Gallbladder: No gallstones or wall thickening visualized. There is no pericholecystic fluid. No sonographic Murphy sign noted by sonographer. Common bile duct: Diameter: 5 mm. There is no intrahepatic, common hepatic, or common bile duct dilatation. Liver: No focal lesion identified. Within normal limits in parenchymal echogenicity. Flow in the portal vein is in the anatomic direction. IVC: No abnormality visualized. Pancreas: No mass or inflammatory focus. Spleen: Spleen measures 13.3 x 14.6 x 6.7 cm with a  measured splenic volume of 683 cubic cm. No focal splenic lesions are evident. Right Kidney: Length: 12.0 cm. Echogenicity within normal limits. No mass or hydronephrosis visualized. Left Kidney: Length: 12.9 cm. Echogenicity within normal limits. No mass or hydronephrosis visualized. Abdominal aorta: No aneurysm visualized. Other findings: No demonstrable ascites. IMPRESSION: Splenomegaly, a finding also evident on prior CT. Study otherwise unremarkable. Electronically Signed   By: Bretta BangWilliam  Woodruff III M.D.   On: 09/06/2016 17:07    Microbiology: Recent Results (from the past 240 hour(s))  MRSA PCR Screening     Status: Abnormal   Collection Time: 09/06/16  8:45 PM  Result Value Ref Range Status   MRSA by PCR POSITIVE (A) NEGATIVE Final    Comment:        The GeneXpert MRSA Assay (FDA approved for NASAL specimens only), is one component of a comprehensive MRSA colonization surveillance program. It is not intended to diagnose MRSA infection nor to guide or monitor treatment for MRSA infections. RESULT CALLED TO, READ BACK BY AND VERIFIED WITH:  PARIS,C @ 0325 ON 09/07/16 BY JUW      Labs: Basic Metabolic Panel:  Recent Labs Lab 09/06/16 1429 09/07/16 0512  NA 137 137  K 2.9* 2.9*  CL 110 107  CO2 24 24  GLUCOSE 90 117*  BUN 17  14  CREATININE 1.23* 0.59  CALCIUM 8.9 7.9*  MG 1.6*  --    Liver Function Tests:  Recent Labs Lab 09/06/16 1429  AST 28  ALT 23  ALKPHOS 54  BILITOT 1.0  PROT 7.5  ALBUMIN 4.0    Recent Labs Lab 09/06/16 1429  LIPASE 10*   No results for input(s): AMMONIA in the last 168 hours. CBC:  Recent Labs Lab 09/06/16 1429 09/07/16 0512  WBC 7.9 5.0  NEUTROABS  --  2.6  HGB 11.3* 10.5*  HCT 34.7* 32.8*  MCV 83.0 83.9  PLT 325 261   Cardiac Enzymes: No results for input(s): CKTOTAL, CKMB, CKMBINDEX, TROPONINI in the last 168 hours. BNP: BNP (last 3 results) No results for input(s): BNP in the last 8760 hours.  ProBNP (last 3  results) No results for input(s): PROBNP in the last 8760 hours.  CBG:  Recent Labs Lab 09/07/16 0750  GLUCAP 107*       Signed:  HERNANDEZ ACOSTA,ESTELA  [facility] Pager: [phone] 09/07/2016, 10:53 AM

## 2016-09-07 NOTE — Progress Notes (Signed)
Pt discharged. PIV removed. Instructions given to pt; understanding verbalized. Pt insisted on ambulating out of unit with friend.

## 2016-09-08 LAB — HEPATITIS PANEL, ACUTE
HEP B S AG: NEGATIVE
Hep A IgM: NEGATIVE
Hep B C IgM: NEGATIVE

## 2016-09-08 LAB — HIV ANTIBODY (ROUTINE TESTING W REFLEX): HIV SCREEN 4TH GENERATION: NONREACTIVE

## 2016-09-08 LAB — RPR: RPR: NONREACTIVE

## 2016-10-10 ENCOUNTER — Encounter (HOSPITAL_COMMUNITY): Payer: Self-pay | Admitting: Emergency Medicine

## 2016-10-10 ENCOUNTER — Emergency Department (HOSPITAL_COMMUNITY)
Admission: EM | Admit: 2016-10-10 | Discharge: 2016-10-10 | Disposition: A | Discharge status: Home / Self Care | Payer: Medicaid Other | Attending: Emergency Medicine | Admitting: Emergency Medicine

## 2016-10-10 DIAGNOSIS — Z791 Long term (current) use of non-steroidal anti-inflammatories (NSAID): Secondary | ICD-10-CM | POA: Insufficient documentation

## 2016-10-10 DIAGNOSIS — N12 Tubulo-interstitial nephritis, not specified as acute or chronic: Secondary | ICD-10-CM | POA: Insufficient documentation

## 2016-10-10 DIAGNOSIS — F17210 Nicotine dependence, cigarettes, uncomplicated: Secondary | ICD-10-CM | POA: Insufficient documentation

## 2016-10-10 LAB — URINALYSIS, ROUTINE W REFLEX MICROSCOPIC
Bilirubin Urine: NEGATIVE
Glucose, UA: NEGATIVE mg/dL
Ketones, ur: NEGATIVE mg/dL
NITRITE: POSITIVE — AB
PROTEIN: 100 mg/dL — AB
Specific Gravity, Urine: 1.025 (ref 1.005–1.030)
pH: 6 (ref 5.0–8.0)

## 2016-10-10 LAB — URINE MICROSCOPIC-ADD ON

## 2016-10-10 LAB — POC URINE PREG, ED: PREG TEST UR: NEGATIVE

## 2016-10-10 MED ORDER — CIPROFLOXACIN HCL 500 MG PO TABS: 500.0000 mg | ORAL_TABLET | Freq: Two times a day (BID) | ORAL | 0 refills | Status: DC

## 2016-10-10 MED ORDER — ONDANSETRON 8 MG PO TBDP
8.0000 mg | ORAL_TABLET | Freq: Once | ORAL | Status: AC
  Administered 2016-10-10: 8 mg via ORAL
  Filled 2016-10-10: qty 1

## 2016-10-10 MED ORDER — ONDANSETRON HCL 4 MG PO TABS: 4.0000 mg | ORAL_TABLET | Freq: Four times a day (QID) | ORAL | 0 refills | Status: DC

## 2016-10-10 MED ORDER — CIPROFLOXACIN HCL 250 MG PO TABS
500.0000 mg | ORAL_TABLET | Freq: Once | ORAL | Status: AC
  Administered 2016-10-10: 500 mg via ORAL
  Filled 2016-10-10: qty 2

## 2016-10-10 MED ORDER — IBUPROFEN 800 MG PO TABS
800.0000 mg | ORAL_TABLET | Freq: Once | ORAL | Status: AC
  Administered 2016-10-10: 800 mg via ORAL
  Filled 2016-10-10: qty 1

## 2016-10-10 NOTE — ED Triage Notes (Signed)
Pt reports burning with urination x 2 days with abscess to left forearm which she drained herself and is not looking better.

## 2016-10-10 NOTE — ED Provider Notes (Signed)
[facility] DEPT Provider Note   CSN: [id number] Arrival date & time: 10/10/16  1516     History   Chief Complaint Chief Complaint  Patient presents with  . Abscess  . Dysuria    HPI Heidi [name] is a 30 y.o. female.  HPI   Heidi [name] is a 30 y.o. female with hx of hepatitis C, chronic pain, and polysubstance abuse, who admits to injecting methamphetamines, presents to the Emergency Department complaining of burning with urination, frequency and dark colored urine for 6 days.  She developed pain to her right flank yesterday.  She admits to chills and subjective fever, and nausea.  Denies vomiting, abdominal pain, chest pain, vaginal bleeding or discharge. She also requests evaluation of an abscess to her left forearm for several days.  She reports the area began draining 2 days ago after applying hot compresses.  She denies increased pain, redness or swelling to the area.    Past Medical History:  Diagnosis Date  . Abnormal Pap smear of vagina   . Anxiety   . Chronic shoulder pain   . Drug use   . Hepatitis C   . Polysubstance abuse     Patient Active Problem List   Diagnosis Date Noted  . Altered mental state 09/06/2016  . AKI (acute kidney injury) (HCC) 09/06/2016  . Normocytic anemia 09/06/2016  . Obtundation 09/06/2016  . Altered mental status   . Substance induced mood disorder (HCC) 05/12/2016  . Elevated LFTs 06/14/2014  . Hepatitis C, acute 06/05/2014  . Polysubstance abuse 06/04/2014  . Acute hepatitis 06/03/2014  . Cholestatic jaundice 06/03/2014  . Cystitis, acute 06/03/2014  . Chronic pain syndrome 06/03/2014  . Hypokalemia 06/03/2014  . CLOSED FRACTURE OF MEDIAL MALLEOLUS 06/20/2010  . ANKLE SPRAIN 01/07/2008    Past Surgical History:  Procedure Laterality Date  . ANKLE SURGERY      OB History    No data available       Home Medications    Prior to Admission medications   Medication Sig Start Date End Date Taking? Authorizing  Provider  ibuprofen (ADVIL,MOTRIN) 200 MG tablet Take 400 mg by mouth every 6 (six) hours as needed for moderate pain.    Historical Provider, MD  levonorgestrel (MIRENA) 20 MCG/24HR IUD 1 each by Intrauterine route once.    Historical Provider, MD    Family History Family History  Problem Relation Age of Onset  . Cirrhosis Father   . Hepatitis C Father     Social History Social History  Substance Use Topics  . Smoking status: Current Every Day Smoker    Packs/day: 1.00    Types: Cigarettes  . Smokeless tobacco: Never Used  . Alcohol use Yes     Allergies   Penicillins; Tylenol [acetaminophen]; Venlafaxine; and Morphine and related   Review of Systems Review of Systems  Constitutional: Positive for chills. Negative for activity change and fever.  Respiratory: Negative for chest tightness and shortness of breath.   Cardiovascular: Negative for chest pain.  Gastrointestinal: Negative for abdominal pain, nausea and vomiting.  Genitourinary: Positive for dysuria, flank pain, frequency and urgency. Negative for decreased urine volume, difficulty urinating, vaginal bleeding and vaginal discharge.  Musculoskeletal: Negative for neck pain and neck stiffness.  Skin:       Abscess left forearm  Neurological: Negative for dizziness, weakness and numbness.     Physical Exam Updated Vital Signs BP 125/65   Pulse (!) 128   Temp 98.2 F (36.8 C) (  Oral)   Resp 18   Ht 5\' 7"  (1.702 m) Comment: Simultaneous filing. User may not have seen previous data.  Wt 59 kg Comment: Simultaneous filing. User may not have seen previous data.  SpO2 100%   BMI 20.36 kg/m   Physical Exam  Constitutional: She is oriented to person, place, and time. She appears well-developed. No distress.  HENT:  Head: Atraumatic.  Mouth/Throat: Oropharynx is clear and moist.  Eyes: Conjunctivae are normal. Pupils are equal, round, and reactive to light.  Neck: Normal range of motion. Neck supple.    Cardiovascular: Regular rhythm and intact distal pulses.   Mild tachycardia  Pulmonary/Chest: Effort normal and breath sounds normal. No respiratory distress. She exhibits no tenderness.  Abdominal: Soft. She exhibits no distension and no mass. There is no hepatosplenomegaly. There is no tenderness. There is CVA tenderness. There is no rebound and no guarding.  Mild right CVA tenderness.   Musculoskeletal: Normal range of motion.  Neurological: She is alert and oriented to person, place, and time.  Skin: Skin is warm.  Quarter sized area of mild erythema with central crusting to mid left forearm.  No fluctuance.  No significant tenderness to palpation      ED Treatments / Results  Labs (all labs ordered are listed, but only abnormal results are displayed) Labs Reviewed  URINALYSIS, ROUTINE W REFLEX MICROSCOPIC (NOT AT [facility]) - Abnormal; Notable for the following:       Result Value   APPearance HAZY (*)    Hgb urine dipstick LARGE (*)    Protein, ur 100 (*)    Nitrite POSITIVE (*)    Leukocytes, UA MODERATE (*)    All other components within normal limits  URINE MICROSCOPIC-ADD ON - Abnormal; Notable for the following:    Squamous Epithelial / LPF 0-5 (*)    Bacteria, UA MANY (*)    All other components within normal limits  URINE CULTURE  POC URINE PREG, ED    EKG  EKG Interpretation None       Radiology No results found.  Procedures Procedures (including critical care time)  Medications Ordered in ED Medications - No data to display   Initial Impression / Assessment and Plan / ED Course  I have reviewed the triage vital signs and the nursing notes.  Pertinent labs & imaging results that were available during my care of the patient were reviewed by me and considered in my medical decision making (see chart for details).  Clinical Course   Pt is non-toxic appearing.  Mild tachycardia, improved after po fluids.  Pt is eating pretzels.  Sx's likely related to  pyelonephritis.  PCN allergy, given cipro instead, urine culture pending.  Return precautions givens.  Pt appears stable for d/c   Final Clinical Impressions(s) / ED Diagnoses   Final diagnoses:  Pyelonephritis    New Prescriptions New Prescriptions   No medications on file     Pauline Aus, Cordelia Poche 10/10/16 1725    Glynn Octave, MD 10/10/16 2351

## 2016-10-10 NOTE — Discharge Instructions (Signed)
Drink plenty of water and cranberry juice for the next several days. Take the antibiotic as directed until it's finished. Return to the ER for any worsening symptoms such as high fever, vomiting, or increasing pain. Ibuprofen every 6-8 hours as needed for pain

## 2016-10-13 LAB — URINE CULTURE

## 2016-10-14 ENCOUNTER — Telehealth (HOSPITAL_BASED_OUTPATIENT_CLINIC_OR_DEPARTMENT_OTHER): Payer: Self-pay | Admitting: Emergency Medicine

## 2016-10-14 NOTE — Telephone Encounter (Signed)
Post ED Visit - Positive Culture Follow-up  Culture report reviewed by antimicrobial stewardship pharmacist:  []  Enzo BiNathan Batchelder, Pharm.D. []  Celedonio MiyamotoJeremy Frens, Pharm.D., BCPS []  Garvin FilaMike Maccia, Pharm.D. []  Georgina PillionElizabeth Martin, Pharm.D., BCPS []  FieldbrookMinh Pham, [address] BoulevardPharm.D., BCPS, AAHIVP []  Estella HuskMichelle Turner, Pharm.D., BCPS, AAHIVP []  Tennis Mustassie Stewart, Pharm.D. []  Sherle Poeob Vincent, [address] BoulevardPharm[address]Lonna Cobb. T. Stone PharmD  Positive urine culture Treated with ciprofloxacin, organism sensitive to the same and no further patient follow-up is required at this time.  Berle MullMiller, Douglas Smolinsky 10/14/2016, 9:58 AM

## 2017-06-25 ENCOUNTER — Emergency Department (HOSPITAL_COMMUNITY)
Admission: EM | Admit: 2017-06-25 | Discharge: 2017-06-25 | Disposition: A | Discharge status: Home / Self Care | Payer: Medicaid Other | Attending: Emergency Medicine | Admitting: Emergency Medicine

## 2017-06-25 ENCOUNTER — Encounter (HOSPITAL_COMMUNITY): Payer: Self-pay | Admitting: Emergency Medicine

## 2017-06-25 DIAGNOSIS — Z793 Long term (current) use of hormonal contraceptives: Secondary | ICD-10-CM | POA: Insufficient documentation

## 2017-06-25 DIAGNOSIS — Z8742 Personal history of other diseases of the female genital tract: Secondary | ICD-10-CM | POA: Insufficient documentation

## 2017-06-25 DIAGNOSIS — F17210 Nicotine dependence, cigarettes, uncomplicated: Secondary | ICD-10-CM | POA: Insufficient documentation

## 2017-06-25 DIAGNOSIS — Z79899 Other long term (current) drug therapy: Secondary | ICD-10-CM | POA: Insufficient documentation

## 2017-06-25 DIAGNOSIS — L03115 Cellulitis of right lower limb: Secondary | ICD-10-CM | POA: Insufficient documentation

## 2017-06-25 DIAGNOSIS — N898 Other specified noninflammatory disorders of vagina: Secondary | ICD-10-CM | POA: Insufficient documentation

## 2017-06-25 DIAGNOSIS — Z791 Long term (current) use of non-steroidal anti-inflammatories (NSAID): Secondary | ICD-10-CM | POA: Insufficient documentation

## 2017-06-25 LAB — URINALYSIS, ROUTINE W REFLEX MICROSCOPIC
Glucose, UA: NEGATIVE mg/dL
Hgb urine dipstick: NEGATIVE
KETONES UR: NEGATIVE mg/dL
Nitrite: NEGATIVE
PROTEIN: 30 mg/dL — AB
Specific Gravity, Urine: 1.026 (ref 1.005–1.030)
pH: 7 (ref 5.0–8.0)

## 2017-06-25 LAB — PREGNANCY, URINE: PREG TEST UR: NEGATIVE

## 2017-06-25 LAB — WET PREP, GENITAL
Sperm: NONE SEEN
TRICH WET PREP: NONE SEEN
YEAST WET PREP: NONE SEEN

## 2017-06-25 MED ORDER — DOXYCYCLINE HYCLATE 100 MG PO CAPS: 100.0000 mg | ORAL_CAPSULE | Freq: Two times a day (BID) | ORAL | 0 refills | Status: DC

## 2017-06-25 MED ORDER — DOXYCYCLINE HYCLATE 100 MG PO TABS
100.0000 mg | ORAL_TABLET | Freq: Once | ORAL | Status: AC
  Administered 2017-06-25: 100 mg via ORAL
  Filled 2017-06-25: qty 1

## 2017-06-25 NOTE — ED Triage Notes (Signed)
Patient complains of "spider bite" on right leg located on upper inner thigh x 3 days.

## 2017-06-25 NOTE — Discharge Instructions (Signed)
Warm wet compresses or soaks to your leg.  Return here for any worsening symptoms.  You can apply 1% hydrocortisone cream 3 times a day as needed.

## 2017-06-26 LAB — GC/CHLAMYDIA PROBE AMP (~~LOC~~) NOT AT ARMC
Chlamydia: NEGATIVE
NEISSERIA GONORRHEA: NEGATIVE

## 2017-06-27 LAB — URINE CULTURE: Culture: <10000 — AB

## 2017-06-28 NOTE — ED Provider Notes (Signed)
[facility] Provider Note   CSN: [id number] Arrival date & time: 06/25/17  1409     History   Chief Complaint Chief Complaint  Patient presents with  . Abscess    HPI Heidi RoseJodi Mcquerry is a 31 y.o. female.  HPI  Heidi [name] is a 31 y.o. female who presents to the Emergency Department complaining of pain, redness and swelling to the posterior right thigh.  States that she was bitten by something and believes it to be a spider.  Describes pain to the area with palpation and sitting.  Denies itching.  Also complains of vaginal discharge and requesting a pelvic exam.  Denies fever, abd pain, abnml vaginal bleeding and urinary symptoms.    Past Medical History:  Diagnosis Date  . Abnormal Pap smear of vagina   . Anxiety   . Chronic shoulder pain   . Drug use   . Hepatitis C   . Polysubstance abuse     Patient Active Problem List   Diagnosis Date Noted  . Altered mental state 09/06/2016  . AKI (acute kidney injury) (HCC) 09/06/2016  . Normocytic anemia 09/06/2016  . Obtundation 09/06/2016  . Altered mental status   . Substance induced mood disorder (HCC) 05/12/2016  . Elevated LFTs 06/14/2014  . Hepatitis C, acute 06/05/2014  . Polysubstance abuse 06/04/2014  . Acute hepatitis 06/03/2014  . Cholestatic jaundice 06/03/2014  . Cystitis, acute 06/03/2014  . Chronic pain syndrome 06/03/2014  . Hypokalemia 06/03/2014  . CLOSED FRACTURE OF MEDIAL MALLEOLUS 06/20/2010  . ANKLE SPRAIN 01/07/2008    Past Surgical History:  Procedure Laterality Date  . ANKLE SURGERY      OB History    No data available       Home Medications    Prior to Admission medications   Medication Sig Start Date End Date Taking? Authorizing Provider  ciprofloxacin (CIPRO) 500 MG tablet Take 1 tablet (500 mg total) by mouth 2 (two) times daily. 10/10/16   Kaelah Hayashi, PA-C  doxycycline (VIBRAMYCIN) 100 MG capsule Take 1 capsule (100 mg total) by mouth 2 (two) times daily. 06/25/17    Aryaa Bunting, PA-C  ibuprofen (ADVIL,MOTRIN) 200 MG tablet Take 400 mg by mouth every 6 (six) hours as needed for moderate pain.    [provider]  levonorgestrel (MIRENA) 20 MCG/24HR IUD 1 each by Intrauterine route once.    [provider]  ondansetron (ZOFRAN) 4 MG tablet Take 1 tablet (4 mg total) by mouth every 6 (six) hours. 10/10/16   Pauline Ausriplett, Latoyna Hird, PA-C    Family History Family History  Problem Relation Age of Onset  . Cirrhosis Father   . Hepatitis C Father     Social History Social History  Substance Use Topics  . Smoking status: Current Every Day Smoker    Packs/day: 1.00    Types: Cigarettes  . Smokeless tobacco: Never Used  . Alcohol use No     Allergies   Penicillins; Tylenol [acetaminophen]; Venlafaxine; and Morphine and related   Review of Systems Review of Systems  Constitutional: Negative for activity change, appetite change, chills and fever.  HENT: Negative for facial swelling, sore throat and trouble swallowing.   Respiratory: Negative for chest tightness, shortness of breath and wheezing.   Gastrointestinal: Negative for abdominal pain, nausea and vomiting.  Genitourinary: Positive for vaginal discharge. Negative for difficulty urinating, dysuria, genital sores, hematuria, pelvic pain and vaginal bleeding.  Musculoskeletal: Negative for neck pain and neck stiffness.  Skin: Positive for rash.  Negative for wound.  Neurological: Negative for dizziness, weakness, numbness and headaches.  All other systems reviewed and are negative.    Physical Exam Updated Vital Signs BP 112/69 (BP Location: Right Arm)   Pulse 87   Temp 98.1 F (36.7 C) (Oral)   Resp 16   Ht 5\' 7"  (1.702 m)   Wt 59 kg (130 lb)   LMP 06/11/2017   SpO2 100%   BMI 20.36 kg/m   Physical Exam  Constitutional: She is oriented to person, place, and time. She appears well-developed and well-nourished. No distress.  HENT:  Head: Normocephalic and atraumatic.    Mouth/Throat: Oropharynx is clear and moist.  Neck: Normal range of motion. Neck supple.  Cardiovascular: Normal rate, regular rhythm, normal heart sounds and intact distal pulses.   No murmur heard. Pulmonary/Chest: Effort normal and breath sounds normal. No respiratory distress.  Abdominal: Soft. She exhibits no distension and no mass. There is no tenderness. There is no rebound and no guarding.  Genitourinary: Uterus normal. Cervix exhibits no motion tenderness and no discharge. Right adnexum displays no mass and no tenderness. Left adnexum displays no mass and no tenderness. No tenderness or bleeding in the vagina. No foreign body in the vagina. Vaginal discharge found.  Genitourinary Comments: Exam chaperoned by nursing.  Mild to moderate creamy white vaginal discharge.  No CMT, no adnexal tenderness or masses.  String from IUD visualized within cervical os.   Musculoskeletal: Normal range of motion. She exhibits no edema or tenderness.  Lymphadenopathy:    She has no cervical adenopathy.  Neurological: She is alert and oriented to person, place, and time. She exhibits normal muscle tone. Coordination normal.  Skin: Skin is warm. No rash noted. No erythema.  Large area of mild erythema to the upper posterior right thigh with small amt of central induration.  No drainage or fluctuance.    Nursing note and vitals reviewed.    ED Treatments / Results  Labs (all labs ordered are listed, but only abnormal results are displayed) Labs Reviewed  WET PREP, GENITAL - Abnormal; Notable for the following:       Result Value   Clue Cells Wet Prep HPF POC PRESENT (*)    WBC, Wet Prep HPF POC FEW (*)    All other components within normal limits  URINE CULTURE - Abnormal; Notable for the following:    Culture   (*)    Value: <10,000 COLONIES/mL INSIGNIFICANT GROWTH Performed at [facility], [address], [place], [place] [address]    All other components within normal limits   URINALYSIS, ROUTINE W REFLEX MICROSCOPIC - Abnormal; Notable for the following:    Color, Urine AMBER (*)    APPearance CLOUDY (*)    Bilirubin Urine SMALL (*)    Protein, ur 30 (*)    Leukocytes, UA TRACE (*)    Bacteria, UA RARE (*)    Squamous Epithelial / LPF 6-30 (*)    All other components within normal limits  PREGNANCY, URINE  GC/CHLAMYDIA PROBE AMP ([place]) NOT AT [facility]    EKG  EKG Interpretation None       Radiology No results found.  Procedures Procedures (including critical care time)  Medications Ordered in ED Medications  doxycycline (VIBRA-TABS) tablet 100 mg (100 mg Oral Given 06/25/17 1629)     Initial Impression / Assessment and Plan / ED Course  I have reviewed the triage vital signs and the nursing notes.  Pertinent labs &  imaging results that were available during my care of the patient were reviewed by me and considered in my medical decision making (see chart for details).     Pt well appearing. Vital stable.  Likely cellulitis of thigh.  Pt agrees to warm soaks and rx for doxy.  Return precautions discussed.   Final Clinical Impressions(s) / ED Diagnoses   Final diagnoses:  Cellulitis of right lower extremity    New Prescriptions Discharge Medication List as of 06/25/2017  4:27 PM    START taking these medications   Details  doxycycline (VIBRAMYCIN) 100 MG capsule Take 1 capsule (100 mg total) by mouth 2 (two) times daily., Starting Wed 06/25/2017, Print         [place], [place], PA-C 06/28/17 1610    Bethann Berkshire, MD 06/28/17 1242

## 2017-08-05 ENCOUNTER — Emergency Department (HOSPITAL_COMMUNITY): Payer: Self-pay

## 2017-08-05 ENCOUNTER — Encounter (HOSPITAL_COMMUNITY): Payer: Self-pay | Admitting: Cardiology

## 2017-08-05 ENCOUNTER — Emergency Department (HOSPITAL_COMMUNITY)
Admission: EM | Admit: 2017-08-05 | Discharge: 2017-08-05 | Disposition: A | Discharge status: Home / Self Care | Payer: Self-pay | Attending: Emergency Medicine | Admitting: Emergency Medicine

## 2017-08-05 DIAGNOSIS — F17210 Nicotine dependence, cigarettes, uncomplicated: Secondary | ICD-10-CM | POA: Insufficient documentation

## 2017-08-05 DIAGNOSIS — R109 Unspecified abdominal pain: Secondary | ICD-10-CM | POA: Insufficient documentation

## 2017-08-05 DIAGNOSIS — Z79899 Other long term (current) drug therapy: Secondary | ICD-10-CM | POA: Insufficient documentation

## 2017-08-05 DIAGNOSIS — N390 Urinary tract infection, site not specified: Secondary | ICD-10-CM | POA: Insufficient documentation

## 2017-08-05 LAB — LIPASE, BLOOD: LIPASE: 22 U/L (ref 11–51)

## 2017-08-05 LAB — URINALYSIS, ROUTINE W REFLEX MICROSCOPIC
BILIRUBIN URINE: NEGATIVE
GLUCOSE, UA: NEGATIVE mg/dL
KETONES UR: NEGATIVE mg/dL
NITRITE: NEGATIVE
PH: 5 (ref 5.0–8.0)
PROTEIN: 30 mg/dL — AB
Specific Gravity, Urine: 1.017 (ref 1.005–1.030)

## 2017-08-05 LAB — CBC WITH DIFFERENTIAL/PLATELET
Basophils Absolute: 0 10*3/uL (ref 0.0–0.1)
Basophils Relative: 0 %
Eosinophils Absolute: 0.1 10*3/uL (ref 0.0–0.7)
Eosinophils Relative: 1 %
HEMATOCRIT: 36.7 % (ref 36.0–46.0)
HEMOGLOBIN: 12.1 g/dL (ref 12.0–15.0)
LYMPHS ABS: 2.3 10*3/uL (ref 0.7–4.0)
Lymphocytes Relative: 29 %
MCH: 27.2 pg (ref 26.0–34.0)
MCHC: 33 g/dL (ref 30.0–36.0)
MCV: 82.5 fL (ref 78.0–100.0)
Monocytes Absolute: 0.5 10*3/uL (ref 0.1–1.0)
Monocytes Relative: 6 %
NEUTROS PCT: 64 %
Neutro Abs: 5.1 10*3/uL (ref 1.7–7.7)
Platelets: 304 10*3/uL (ref 150–400)
RBC: 4.45 MIL/uL (ref 3.87–5.11)
RDW: 14.9 % (ref 11.5–15.5)
WBC: 8 10*3/uL (ref 4.0–10.5)

## 2017-08-05 LAB — COMPREHENSIVE METABOLIC PANEL
ALBUMIN: 3.8 g/dL (ref 3.5–5.0)
ALK PHOS: 80 U/L (ref 38–126)
ALT: 43 U/L (ref 14–54)
ANION GAP: 9 (ref 5–15)
AST: 27 U/L (ref 15–41)
BILIRUBIN TOTAL: 0.6 mg/dL (ref 0.3–1.2)
BUN: 15 mg/dL (ref 6–20)
CALCIUM: 9.6 mg/dL (ref 8.9–10.3)
CO2: 26 mmol/L (ref 22–32)
Chloride: 108 mmol/L (ref 101–111)
Creatinine, Ser: 0.72 mg/dL (ref 0.44–1.00)
GLUCOSE: 139 mg/dL — AB (ref 65–99)
POTASSIUM: 3.4 mmol/L — AB (ref 3.5–5.1)
Sodium: 143 mmol/L (ref 135–145)
TOTAL PROTEIN: 7.7 g/dL (ref 6.5–8.1)

## 2017-08-05 LAB — PREGNANCY, URINE: Preg Test, Ur: NEGATIVE

## 2017-08-05 LAB — ETHANOL

## 2017-08-05 MED ORDER — SULFAMETHOXAZOLE-TRIMETHOPRIM 800-160 MG PO TABS: 1.0000 | ORAL_TABLET | Freq: Two times a day (BID) | ORAL | 0 refills | Status: AC

## 2017-08-05 MED ORDER — SULFAMETHOXAZOLE-TRIMETHOPRIM 800-160 MG PO TABS
1.0000 | ORAL_TABLET | Freq: Once | ORAL | Status: AC
  Administered 2017-08-05: 1 via ORAL
  Filled 2017-08-05: qty 1

## 2017-08-05 NOTE — ED Notes (Signed)
Pt alert & oriented x4, stable gait. Patient given discharge instructions, paperwork & prescription(s). Patient  instructed to stop at the registration desk to finish any additional paperwork. Patient verbalized understanding. Pt left department w/ no further questions. 

## 2017-08-05 NOTE — Discharge Instructions (Signed)
As discussed, your evaluation today has been largely reassuring.  But, it is important that you monitor your condition carefully, and do not hesitate to return to the ED if you develop new, or concerning changes in your condition. ? ?Otherwise, please follow-up with your physician for appropriate ongoing care. ? ?

## 2017-08-05 NOTE — ED Provider Notes (Signed)
[facility]-EMERGENCY DEPT Provider Note   CSN: [id number] Arrival date & time: 08/05/17  1751     History   Chief Complaint Chief Complaint  Patient presents with  . Dysuria    HPI Heidi [name] is a 31 y.o. female.  HPI  Patient presents with concern of generalized fatigue, as well as dysuria, and ongoing left flank pain. Patient notes that she has had similar pain, as well as worst dysuria for some time, but over the past 2 months has been incarcerated. Since release 3 weeks ago she has not seen a primary care physician, but continues to complain of left flank pain, dysuria, occasional bloody discharge. Patient has IUD in place. She has no abdominal pain, no chest pain, dyspnea.  Patient has not seen a physician in some time, notes that she does have hepatitis C, but is not currently being treated for it.   Past Medical History:  Diagnosis Date  . Abnormal Pap smear of vagina   . Anxiety   . Chronic shoulder pain   . Drug use   . Hepatitis C   . Polysubstance abuse     Patient Active Problem List   Diagnosis Date Noted  . Altered mental state 09/06/2016  . AKI (acute kidney injury) (HCC) 09/06/2016  . Normocytic anemia 09/06/2016  . Obtundation 09/06/2016  . Altered mental status   . Substance induced mood disorder (HCC) 05/12/2016  . Elevated LFTs 06/14/2014  . Hepatitis C, acute 06/05/2014  . Polysubstance abuse 06/04/2014  . Acute hepatitis 06/03/2014  . Cholestatic jaundice 06/03/2014  . Cystitis, acute 06/03/2014  . Chronic pain syndrome 06/03/2014  . Hypokalemia 06/03/2014  . CLOSED FRACTURE OF MEDIAL MALLEOLUS 06/20/2010  . ANKLE SPRAIN 01/07/2008    Past Surgical History:  Procedure Laterality Date  . ANKLE SURGERY      OB History    No data available       Home Medications    Prior to Admission medications   Medication Sig Start Date End Date Taking? Authorizing Provider  ciprofloxacin (CIPRO) 500 MG tablet Take 1 tablet (500 mg total) by  mouth 2 (two) times daily. 10/10/16   Triplett, Tammy, PA-C  doxycycline (VIBRAMYCIN) 100 MG capsule Take 1 capsule (100 mg total) by mouth 2 (two) times daily. 06/25/17   Triplett, Tammy, PA-C  ibuprofen (ADVIL,MOTRIN) 200 MG tablet Take 400 mg by mouth every 6 (six) hours as needed for moderate pain.    [provider]  levonorgestrel (MIRENA) 20 MCG/24HR IUD 1 each by Intrauterine route once.    [provider]  ondansetron (ZOFRAN) 4 MG tablet Take 1 tablet (4 mg total) by mouth every 6 (six) hours. 10/10/16   Pauline Aus, PA-C    Family History Family History  Problem Relation Age of Onset  . Cirrhosis Father   . Hepatitis C Father     Social History Social History  Substance Use Topics  . Smoking status: Current Every Day Smoker    Packs/day: 1.00    Types: Cigarettes  . Smokeless tobacco: Never Used  . Alcohol use No     Allergies   Penicillins; Tylenol [acetaminophen]; Venlafaxine; and Morphine and related   Review of Systems Review of Systems  Constitutional:       Per HPI, otherwise negative  HENT:       Per HPI, otherwise negative  Respiratory:       Per HPI, otherwise negative  Cardiovascular:       Per HPI, otherwise  negative  Gastrointestinal: Negative for vomiting.  Endocrine:       Negative aside from HPI  Genitourinary:       Neg aside from HPI   Musculoskeletal:       Per HPI, otherwise negative  Skin: Negative.   Neurological: Negative for syncope.     Physical Exam Updated Vital Signs BP 128/87 (BP Location: Right Arm)   Pulse (!) 102   Temp 98.4 F (36.9 C) (Oral)   Resp 18   Ht 5\' 7"  (1.702 m)   Wt 59 kg (130 lb)   SpO2 97%   BMI 20.36 kg/m   Physical Exam  Constitutional: She is oriented to person, place, and time. She appears well-developed and well-nourished. No distress.  HENT:  Head: Normocephalic and atraumatic.  Eyes: Conjunctivae and EOM are normal.  Cardiovascular: Normal rate and regular rhythm.    Pulmonary/Chest: Effort normal and breath sounds normal. No stridor. No respiratory distress.  Abdominal: She exhibits no distension and no mass. There is no tenderness. There is no rebound and no guarding.  Musculoskeletal: She exhibits no edema.  Neurological: She is alert and oriented to person, place, and time. No cranial nerve deficit.  Skin: Skin is warm and dry.  Psychiatric: She has a normal mood and affect.  Nursing note and vitals reviewed.    ED Treatments / Results  Labs (all labs ordered are listed, but only abnormal results are displayed) Labs Reviewed  URINALYSIS, ROUTINE W REFLEX MICROSCOPIC - Abnormal; Notable for the following:       Result Value   APPearance CLOUDY (*)    Hgb urine dipstick SMALL (*)    Protein, ur 30 (*)    Leukocytes, UA LARGE (*)    Bacteria, UA RARE (*)    Squamous Epithelial / LPF 6-30 (*)    All other components within normal limits  COMPREHENSIVE METABOLIC PANEL - Abnormal; Notable for the following:    Potassium 3.4 (*)    Glucose, Bld 139 (*)    All other components within normal limits  PREGNANCY, URINE  LIPASE, BLOOD  ETHANOL  CBC WITH DIFFERENTIAL/PLATELET     Radiology Ct Renal Stone Study  Result Date: 08/05/2017 CLINICAL DATA:  Left flank and back pain for 1 week. Vaginal discharge. EXAM: CT ABDOMEN AND PELVIS WITHOUT CONTRAST TECHNIQUE: Multidetector CT imaging of the abdomen and pelvis was performed following the standard protocol without IV contrast. COMPARISON:  12/25/2014 FINDINGS: Lower chest: No acute abnormality. Hepatobiliary: No focal liver abnormality is seen. No gallstones, gallbladder wall thickening, or biliary dilatation. Pancreas: Unremarkable. No pancreatic ductal dilatation or surrounding inflammatory changes. Spleen: Normal in size without focal abnormality. Adrenals/Urinary Tract: Adrenal glands are unremarkable. Kidneys are normal, without renal calculi, focal lesion, or hydronephrosis. Bladder is  unremarkable. Stomach/Bowel: Stomach is within normal limits. Appendix appears normal. No evidence of bowel wall thickening, distention, or inflammatory changes. Vascular/Lymphatic: No significant vascular findings are present. No enlarged abdominal or pelvic lymph nodes. Reproductive: Bilateral adnexa are unremarkable. Retroverted uterus. IUD in place. Other: No abdominal wall hernia or abnormality. No abdominopelvic ascites. Musculoskeletal: No acute or significant osseous findings. IMPRESSION: No evidence of acute abnormality within the abdomen or pelvis. Electronically Signed   By: Ted Mcalpine M.D.   On: 08/05/2017 20:12    Procedures Procedures (including critical care time)  Medications Ordered in ED Medications  sulfamethoxazole-trimethoprim (BACTRIM DS,SEPTRA DS) 800-160 MG per tablet 1 tablet (not administered)     Initial Impression / Assessment and  Plan / ED Course  I have reviewed the triage vital signs and the nursing notes.  Pertinent labs & imaging results that were available during my care of the patient were reviewed by me and considered in my medical decision making (see chart for details).    8:43 PM On repeat exam the patient is awake and alert. She sitting upright. We discussed all findings. With mild ongoing left flank pain, abnormal urinalysis there is some suspicion for UTI, no evidence for bacteremia, sepsis. Patient has scheduled follow-up in the clinic in one week. Patient will start antibiotics, acknowledge importance of one-week follow-up, as well as return precautions.   Final Clinical Impressions(s) / ED Diagnoses  Urinary tract infection Flank pain   Gerhard Munch, MD 08/05/17 2043

## 2017-08-05 NOTE — ED Triage Notes (Signed)
Pain with urination,  Discharge . Left sided back pain times ne month.
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
## 2019-11-13 ENCOUNTER — Other Ambulatory Visit: Payer: Self-pay

## 2019-11-13 ENCOUNTER — Emergency Department (HOSPITAL_COMMUNITY)
Admission: EM | Admit: 2019-11-13 | Discharge: 2019-11-13 | Disposition: A | Discharge status: Home / Self Care | Payer: Self-pay | Attending: Emergency Medicine | Admitting: Emergency Medicine

## 2019-11-13 ENCOUNTER — Encounter (HOSPITAL_COMMUNITY): Payer: Self-pay | Admitting: Emergency Medicine

## 2019-11-13 DIAGNOSIS — Z79899 Other long term (current) drug therapy: Secondary | ICD-10-CM | POA: Insufficient documentation

## 2019-11-13 DIAGNOSIS — F17210 Nicotine dependence, cigarettes, uncomplicated: Secondary | ICD-10-CM | POA: Insufficient documentation

## 2019-11-13 DIAGNOSIS — K047 Periapical abscess without sinus: Secondary | ICD-10-CM | POA: Insufficient documentation

## 2019-11-13 MED ORDER — CLINDAMYCIN HCL 150 MG PO CAPS: 300.0000 mg | ORAL_CAPSULE | Freq: Four times a day (QID) | ORAL | 0 refills | Status: DC

## 2019-11-13 MED ORDER — CLINDAMYCIN HCL 150 MG PO CAPS
300.0000 mg | ORAL_CAPSULE | Freq: Once | ORAL | Status: AC
  Administered 2019-11-13: 300 mg via ORAL
  Filled 2019-11-13: qty 2

## 2019-11-13 NOTE — ED Triage Notes (Signed)
The patent states that she has an abcessed tooth on her lower left side. It started on thrusday but got worse in the last 24 hours

## 2019-11-13 NOTE — ED Provider Notes (Signed)
[facility] EMERGENCY DEPARTMENT Provider Note   CSN: [id number] Arrival date & time: 11/13/19  1206     History   Chief Complaint Chief Complaint  Patient presents with  . Dental Pain    HPI Heidi [name] is a 33 y.o. female.     HPI   Heidi [name] is a 33 y.o. female who presents to the Emergency Department complaining of left-sided dental pain for 2 days ago.  Woke this morning with left lower facial swelling and increasing pain.  She states that she has multiple broken teeth and dental caries secondary to history of substance abuse.  She states she has been unable to see a dentist.  Pain is associated with chewing.  She denies fever, chills, neck pain, difficulty swallowing or breathing and difficulty with opening or closing her mouth.   Past Medical History:  Diagnosis Date  . Abnormal Pap smear of vagina   . Anxiety   . Chronic shoulder pain   . Drug use   . Hepatitis C   . Polysubstance abuse [facility])     Patient Active Problem List   Diagnosis Date Noted  . Altered mental state 09/06/2016  . AKI (acute kidney injury) ([place]) 09/06/2016  . Normocytic anemia 09/06/2016  . Obtundation 09/06/2016  . Altered mental status   . Substance induced mood disorder ([place]) 05/12/2016  . Elevated LFTs 06/14/2014  . Hepatitis C, acute 06/05/2014  . Polysubstance abuse ([place]) 06/04/2014  . Acute hepatitis 06/03/2014  . Cholestatic jaundice 06/03/2014  . Cystitis, acute 06/03/2014  . Chronic pain syndrome 06/03/2014  . Hypokalemia 06/03/2014  . CLOSED FRACTURE OF MEDIAL MALLEOLUS 06/20/2010  . ANKLE SPRAIN 01/07/2008    Past Surgical History:  Procedure Laterality Date  . ANKLE SURGERY       OB History   No obstetric history on file.      Home Medications    Prior to Admission medications   Medication Sig Start Date End Date Taking? Authorizing Provider  levonorgestrel (MIRENA) 20 MCG/24HR IUD 1 each by Intrauterine route once.    [provider]     Family History Family History  Problem Relation Age of Onset  . Cirrhosis Father   . Hepatitis C Father     Social History Social History   Tobacco Use  . Smoking status: Current Every Day Smoker    Packs/day: 1.00    Types: Cigarettes  . Smokeless tobacco: Never Used  Substance Use Topics  . Alcohol use: No  . Drug use: Not Currently    Types: Heroin, Marijuana, Methamphetamines    Comment: used meth yesterday      Allergies   Penicillins, Tylenol [acetaminophen], Venlafaxine, and Morphine and related   Review of Systems Review of Systems  Constitutional: Negative for appetite change and fever.  HENT: Positive for dental problem and facial swelling. Negative for congestion, ear pain, sore throat and trouble swallowing.   Eyes: Negative for pain and visual disturbance.  Gastrointestinal: Negative for abdominal pain, nausea and vomiting.  Musculoskeletal: Negative for neck pain and neck stiffness.  Neurological: Negative for dizziness, facial asymmetry and headaches.  Hematological: Negative for adenopathy.     Physical Exam Updated Vital Signs BP 115/85 (BP Location: Right Arm)   Pulse 68   Temp 99.6 F (37.6 C) (Oral)   Resp 14   Ht 5\' 7"  (1.702 m)   Wt 72.6 kg   SpO2 99%   BMI 25.06 kg/m   Physical Exam Vitals signs and  nursing note reviewed.  Constitutional:      General: She is not in acute distress.    Appearance: Normal appearance. She is well-developed.  HENT:     Head:     Jaw: No trismus.     Right Ear: Tympanic membrane and ear canal normal.     Left Ear: Tympanic membrane and ear canal normal.     Mouth/Throat:     Mouth: Mucous membranes are moist.     Dentition: Dental tenderness and dental caries present. No dental abscesses.     Pharynx: Oropharynx is clear. Uvula midline. No uvula swelling.     Comments: Patient with widespread dental decay. Tenderness of multiple lower left teeth. There is erythema and edema of the gingiva adjacent  to the left lower lateral incisor and left lower molars. Moderate edema noted to the left lower face. No submandibular edema or tenderness. No trismus. Uvula is midline and nonedematous. Neck:     Musculoskeletal: Normal range of motion and neck supple.  Cardiovascular:     Rate and Rhythm: Normal rate and regular rhythm.     Heart sounds: Normal heart sounds. No murmur.  Pulmonary:     Effort: Pulmonary effort is normal.     Breath sounds: Normal breath sounds.  Musculoskeletal: Normal range of motion.  Lymphadenopathy:     Cervical: No cervical adenopathy.  Skin:    General: Skin is warm and dry.  Neurological:     Mental Status: She is alert.     Sensory: No sensory deficit.     Motor: No abnormal muscle tone.      ED Treatments / Results  Labs (all labs ordered are listed, but only abnormal results are displayed) Labs Reviewed - No data to display  EKG None  Radiology No results found.  Procedures Procedures (including critical care time)  Medications Ordered in ED Medications  clindamycin (CLEOCIN) capsule 300 mg (has no administration in time range)     Initial Impression / Assessment and Plan / ED Course  I have reviewed the triage vital signs and the nursing notes.  Pertinent labs & imaging results that were available during my care of the patient were reviewed by me and considered in my medical decision making (see chart for details).        Patient with several day history of dental pain and woke this morning with left lower facial swelling. Symptoms likely related to a developing periapical abscess. No drainable abscess noted at this time. Airway is patent. No trismus. No concerning sx's for Ludwig's angina. I have recommended IV antibiotics at this time, but patient declines stating that she does not have time to wait for the medication to infuse.  Oral given here as well as rx written.  Agrees to close dental f/u.    Final Clinical Impressions(s) / ED  Diagnoses   Final diagnoses:  Dental abscess    ED Discharge Orders    None       Pauline Aus, PA-C 11/13/19 1604    Terrilee Files, MD 11/13/19 1710

## 2019-11-13 NOTE — Discharge Instructions (Addendum)
Be sure to take the antibiotics as directed. Warm salt water rinses several times a day. It is important that you follow-up with a dentist next week.

## 2019-11-13 NOTE — ED Notes (Signed)
Here for assessment of dental pain   Pt is in NAD playing and texting on cell phone

## 2020-04-05 ENCOUNTER — Encounter (HOSPITAL_COMMUNITY): Payer: Self-pay

## 2020-04-05 ENCOUNTER — Other Ambulatory Visit: Payer: Self-pay

## 2020-04-05 ENCOUNTER — Emergency Department (HOSPITAL_COMMUNITY)
Admission: EM | Admit: 2020-04-05 | Discharge: 2020-04-05 | Disposition: A | Discharge status: Home / Self Care | Payer: Medicaid Other | Attending: Emergency Medicine | Admitting: Emergency Medicine

## 2020-04-05 DIAGNOSIS — Z79899 Other long term (current) drug therapy: Secondary | ICD-10-CM | POA: Diagnosis not present

## 2020-04-05 DIAGNOSIS — Z0289 Encounter for other administrative examinations: Secondary | ICD-10-CM | POA: Diagnosis present

## 2020-04-05 DIAGNOSIS — F17210 Nicotine dependence, cigarettes, uncomplicated: Secondary | ICD-10-CM | POA: Insufficient documentation

## 2020-04-05 DIAGNOSIS — F1110 Opioid abuse, uncomplicated: Secondary | ICD-10-CM | POA: Diagnosis not present

## 2020-04-05 NOTE — Discharge Instructions (Addendum)
Substance Abuse Treatment Programs  Intensive Outpatient Programs [facility]     [address]      [place], [place]                   [phone]       [facility] [address] [place], [place] [phone]  [facility]     (Inpatient and outpatient)     [address]           [phone]    [facility] [phone] (Suboxone and Methadone)  [address]      [place], [place] [address]      [phone]       [address] [place], [place] [phone]  [facility] (Outpatient/Inpatient, [facility])    (insurance only) [phone]             [facility] (Groups & Residential) [place], [place] [phone]     [facility]     [address]     [place], [place]      [phone]       [facility] (for caregivers and family) [phone] [address] Laurell [address] [place], [place] [phone]    Residential Treatment Programs [facility]      [address], [place], [place] [address]  [phone]       [facility] [address] [place], [place] [address] [phone]  [facility] [place]        [phone]       [facility] [phone]  [facility]             [address]                                         [place], [place]                                                [phone] or [phone]                               [facility] [place] [address] [place], [address] [phone]  [facility]    [address]      [place], [place]     [phone]       [facility] [address] [place], [place] [phone]  [facility]   [address] [place], [place] [address]     [phone]      Admissions: 8am-3pm M-F  [facility] ([facility]) [address] [place],  [place] [phone]  [facility]: Residential Program [phone] Days)   [place], [place]      [phone] or [phone]     [facility]: [facility] [place], [place] (Walk in Hours over the weekend or by referral)  [facility] [address] [place], [place], [place] [address] [phone]  Crisis Mobile: [facility]:  [phone] (for crisis response 24 hours a day) [facility] Hotline:      [phone] Outpatient Psychiatry and Counseling  [facility]: Mobile Crisis Management  24 hours:  [phone]  [facility] [employer] sliding scale fee and walk in schedule: M-F 8am-12pm/1pm-3pm [place], [place] [address] [place], [place] [address] [phone]  [facility] ([facility] [employer])- new patient walk-in appointments available Monday - Friday 8am -3pm.          [address] [place], [place] [address] [phone] or crisis line- [facility]/ Intensive Outpatient Therapy Program [place], [place] [address] [place]      [phone] [address] [place], [place] [address]                 [place]   [facility] [phone]. [place], [place] [address]   [employer] [facility]          [address] Johnette Abraham  [place], [place] [address]       [phone]  [place], [place], [place] [address] [phone]  [facility]    [address] [place], [place] [address]     [place], [facility] Joy[facility]elyn Man     [place] [address]     [phone]       [facility] [place] [address]  [facility]     [address], [place], [place], [place] [address] [place]     [address]     [place], [place] [address]     [phone]       [facility] [address] [place], [place] [address] [phone] Resources for Temporary Residential Assistance/Crisis [facility]) M-F 8am-3pm   [address] [place], [place] [address]   [phone] Services include: laundry, barbering, support groups, case management, phone  & computer access, showers, AA/NA mtgs, mental health/substance abuse nurse, job skills class, disability information, VA assistance, spiritual classes, etc.   [facility]   [address], [place]     [place] (women and children)       [place] [place], [place] [address] [phone] [email or web address] for application and process Application Required  [facility]   [address]    [place] [address]     [phone]                    [place], [place] [address] [phone]([email or web address] application appt.) Application Required  [facility] (women only)    [address]     [place], [place] [address]     [phone]  Intake starts 6pm daily Need valid ID, SSC, & Police report [employer] [address] [place], [place] [phone] Application Required  [facility] (men only)     [address]      [place], [place]     [phone]       [facility] [place] (Pregnant women only) [address] [place], [place] [phone]  [facility]      [address]      [place], [place] [address]     [phone]             [facility] [address] [place], [place] [phone] 90 day commitment/SA/Application process  [facility](men only)     [address]     [place], [place]     [phone]       Check-in at [facility] [address] [place], [place] [address] [phone] Men/Women/Women and Children must be there by 7 pm  [facility] [place], [place] [phone]                 You were seen today in the emergency department. Come back to the ER if you start experiencing any side effects from withdrawing. Above are a list of resources that you should use.

## 2020-04-05 NOTE — ED Provider Notes (Signed)
This patient is a 34 year old female with a known history of heroin abuse.  She reports that she is actively taking Subutex and heroin, she does not understand the treatment plan and protocol that she is in.  She states that she last used heroin earlier today.  When she was at social services while trying to get her daughter back with regards to custody she had a vomiting spell stating that she had choked on a piece of peppermint candy that was in her mouth.  She does not have any symptoms at this time other than feeling mild withdrawal.  I offered the patient a work-up and referral for inpatient services, she declines this at this time and states that she already has Subutex at home and does not want to pursue inpatient treatment.  She appears well with a normal mental status, soft nontender abdomen and clear heart and lung sounds without tachycardia, no diaphoresis, no other complaints.  She has medical decision-making capacity, she is not a danger to herself or others and denies any suicidal thoughts or hallucinations.  She is stable for discharge and given a resource list for follow-up  Medical screening examination/treatment/procedure(s) were conducted as a shared visit with non-physician practitioner(s) and myself.  I personally evaluated the patient during the encounter.  Clinical Impression:   Final diagnoses:  Heroin abuse (HCC)         Eber Hong, MD 04/08/20 [phone]

## 2020-04-05 NOTE — ED Provider Notes (Signed)
[facility] EMERGENCY DEPARTMENT Provider Note   CSN: [id number] Arrival date & time: 04/05/20  1313     History Chief Complaint  Patient presents with  . Medical Clearance    Heidi [name] is a 34 y.o. female with a past medical history of polysubstance abuse, hep C, anxiety that presents to the emergency room via EMS due to vomiting at social services this morning.  She had a meeting there to get custody of her children and EMS was called due to her 1 episode of vomiting.  Patient has been using heroin.  Patient states that she vomited due to her accidentally swallowing gum.  She states that the vomit was not bilious or bloody.  She denies any current nausea and states that she wants to leave.  In regards to her heroin use, patient states that she snorted 0.1 g this morning, she states that she normally uses about a half a gram throughout the day.  She denies injecting.  She has she had been clean for about 30 years but the last 2 months she has been using daily.  She is prescribed Subutex, but states that she has not been taking this as prescribed.  She states that she lives with her mom and 2 children.  She denies any fevers, chills, current nausea, abnormal stools, dizziness, headache, paresthesias, back pain, abdominal pain, dysuria, chest pain, shortness of breath. She denies any SI/HI or hallucinations.  She states that she would like resources for help to get off heroin, but does not want help in the emergency room today.  HPI     Past Medical History:  Diagnosis Date  . Abnormal Pap smear of vagina   . Anxiety   . Chronic shoulder pain   . Drug use   . Hepatitis C   . Polysubstance abuse [facility])     Patient Active Problem List   Diagnosis Date Noted  . Altered mental state 09/06/2016  . AKI (acute kidney injury) (HCC) 09/06/2016  . Normocytic anemia 09/06/2016  . Obtundation 09/06/2016  . Altered mental status   . Substance induced mood disorder (HCC) 05/12/2016  .  Elevated LFTs 06/14/2014  . Hepatitis C, acute 06/05/2014  . Polysubstance abuse (HCC) 06/04/2014  . Acute hepatitis 06/03/2014  . Cholestatic jaundice 06/03/2014  . Cystitis, acute 06/03/2014  . Chronic pain syndrome 06/03/2014  . Hypokalemia 06/03/2014  . CLOSED FRACTURE OF MEDIAL MALLEOLUS 06/20/2010  . ANKLE SPRAIN 01/07/2008    Past Surgical History:  Procedure Laterality Date  . ANKLE SURGERY       OB History   No obstetric history on file.     Family History  Problem Relation Age of Onset  . Cirrhosis Father   . Hepatitis C Father     Social History   Tobacco Use  . Smoking status: Current Every Day Smoker    Packs/day: 1.00    Types: Cigarettes  . Smokeless tobacco: Never Used  Substance Use Topics  . Alcohol use: No  . Drug use: Yes    Types: Heroin, Marijuana, Methamphetamines, IV    Comment: Heroin yesterday    Home Medications Prior to Admission medications   Medication Sig Start Date End Date Taking? Authorizing Provider  clindamycin (CLEOCIN) 150 MG capsule Take 2 capsules (300 mg total) by mouth 4 (four) times daily. 11/13/19   Triplett, Tammy, PA-C  levonorgestrel (MIRENA) 20 MCG/24HR IUD 1 each by Intrauterine route once.    [provider]    Allergies  Penicillins, Tylenol [acetaminophen], Venlafaxine, and Morphine and related  Review of Systems   Review of Systems  Gastrointestinal: Positive for vomiting.   .Ten systems are reviewed by me and are negative for acute changes, except as noted in the HPI.  Physical Exam Updated Vital Signs BP 124/66 (BP Location: Right Arm)   Pulse 84   Temp 98.4 F (36.9 C) (Oral)   Resp 18   Ht 5\' 7"  (1.702 m)   Wt 72.6 kg   SpO2 100%   BMI 25.06 kg/m   Physical Exam PE: Constitutional: well-developed, well-nourished, no apparent distress HENT: normocephalic, atraumatic Cardiovascular: normal rate and rhythm, distal pulses intact Pulmonary/Chest: effort normal; breath sounds  clear and equal bilaterally; no wheezes or rales Abdominal: NBS, soft and nontender Musculoskeletal: full ROM, no edema Lymphadenopathy: no cervical adenopathy Neuro: Alert. Clear speech. CNIII-XII grossly intact.  Skin: warm and dry, no rash, no diaphoresis Psychiatric: normal mood and affect, normal behavior   ED Results / Procedures / Treatments   Labs (all labs ordered are listed, but only abnormal results are displayed) Labs Reviewed - No data to display  EKG None  Radiology No results found.  Procedures Procedures (including critical care time)  Medications Ordered in ED Medications - No data to display  ED Course  I have reviewed the triage vital signs and the nursing notes.  Pertinent labs & imaging results that were available during my care of the patient were reviewed by me and considered in my medical decision making (see chart for details).    MDM Rules/Calculators/A&P                     Heidi [name] is a 34 y.o. female with a past medical history of polysubstance abuse, hep C, anxiety that presents to the emergency room via EMS due to vomiting at social services this morning.  Patient is a chronic heroin user and was using this morning.  She states that she vomited due to swallowing gum.  She does not think she needs to be here and denies any SI/HI or hallucinations.  She is currently not complaining of anything.  She is not withdrawing and her affect matches her presentation.  Shared decision making to decide if patient wants immediate resources or if patient wanted resources outpatient.  Patient states that she would prefer to leave and have outpatient resources.  Explained benefits of both.  Patient does not need any immediate medical attention.  Patient to be discharged.  Patient agreeable.  ER return precautions given if withdrawal symptoms begin or if patient starts becoming suicidal, homicidal or hallucinating. Resources given.   .I discussed this case with my  attending physician, Dr. Sabra Heck,  who cosigned this note including patient's presenting symptoms, physical exam, and planned diagnostics and interventions. Attending physician stated agreement with plan or made changes to plan which were implemented.   Attending physician assessed patient at bedside.   Final Clinical Impression(s) / ED Diagnoses Final diagnoses:  Heroin abuse [facility])    Rx / [place] Orders ED Discharge Orders    None       Alfredia Client, PA-C 04/06/20 1143    Noemi Chapel, MD 04/08/20 [phone]

## 2020-04-05 NOTE — ED Triage Notes (Signed)
Pt presents to ED for treatment for heroin addiction. Pt states she last used this am. Pt denies SI/HI. Pt states she vomited x 1 at social services this am, she had a meeting there about getting custody of her children and they called EMS and she was instructed to come here.

## 2020-08-01 ENCOUNTER — Encounter (HOSPITAL_COMMUNITY): Payer: Self-pay

## 2020-08-01 ENCOUNTER — Emergency Department (HOSPITAL_COMMUNITY)
Admission: EM | Admit: 2020-08-01 | Discharge: 2020-08-01 | Disposition: A | Discharge status: Home / Self Care | Payer: Medicaid Other | Attending: Emergency Medicine | Admitting: Emergency Medicine

## 2020-08-01 ENCOUNTER — Emergency Department (HOSPITAL_COMMUNITY): Payer: Medicaid Other

## 2020-08-01 ENCOUNTER — Other Ambulatory Visit: Payer: Self-pay

## 2020-08-01 DIAGNOSIS — F1510 Other stimulant abuse, uncomplicated: Secondary | ICD-10-CM | POA: Insufficient documentation

## 2020-08-01 DIAGNOSIS — F17210 Nicotine dependence, cigarettes, uncomplicated: Secondary | ICD-10-CM | POA: Diagnosis not present

## 2020-08-01 DIAGNOSIS — F1110 Opioid abuse, uncomplicated: Secondary | ICD-10-CM | POA: Insufficient documentation

## 2020-08-01 DIAGNOSIS — Z20822 Contact with and (suspected) exposure to covid-19: Secondary | ICD-10-CM | POA: Diagnosis not present

## 2020-08-01 DIAGNOSIS — F1210 Cannabis abuse, uncomplicated: Secondary | ICD-10-CM | POA: Diagnosis not present

## 2020-08-01 DIAGNOSIS — F1910 Other psychoactive substance abuse, uncomplicated: Secondary | ICD-10-CM

## 2020-08-01 DIAGNOSIS — R4182 Altered mental status, unspecified: Secondary | ICD-10-CM | POA: Diagnosis present

## 2020-08-01 LAB — COMPREHENSIVE METABOLIC PANEL
ALT: 19 U/L (ref 0–44)
AST: 22 U/L (ref 15–41)
Albumin: 4.1 g/dL (ref 3.5–5.0)
Alkaline Phosphatase: 77 U/L (ref 38–126)
Anion gap: 15 (ref 5–15)
BUN: 26 mg/dL — ABNORMAL HIGH (ref 6–20)
CO2: 18 mmol/L — ABNORMAL LOW (ref 22–32)
Calcium: 9.5 mg/dL (ref 8.9–10.3)
Chloride: 104 mmol/L (ref 98–111)
Creatinine, Ser: 0.96 mg/dL (ref 0.44–1.00)
GFR calc Af Amer: >60 mL/min (ref >60)
GFR calc non Af Amer: >60 mL/min (ref >60)
Glucose, Bld: 114 mg/dL — ABNORMAL HIGH (ref 70–99)
Potassium: 3 mmol/L — ABNORMAL LOW (ref 3.5–5.1)
Sodium: 137 mmol/L (ref 135–145)
Total Bilirubin: 1.3 mg/dL — ABNORMAL HIGH (ref 0.3–1.2)
Total Protein: 8.1 g/dL (ref 6.5–8.1)

## 2020-08-01 LAB — CBC WITH DIFFERENTIAL/PLATELET
Abs Immature Granulocytes: 0.07 10*3/uL (ref 0.00–0.07)
Basophils Absolute: 0.1 10*3/uL (ref 0.0–0.1)
Basophils Relative: 0 %
Eosinophils Absolute: 0 10*3/uL (ref 0.0–0.5)
Eosinophils Relative: 0 %
HCT: 40.9 % (ref 36.0–46.0)
Hemoglobin: 13.3 g/dL (ref 12.0–15.0)
Immature Granulocytes: 1 %
Lymphocytes Relative: 14 %
Lymphs Abs: 2.1 10*3/uL (ref 0.7–4.0)
MCH: 25.5 pg — ABNORMAL LOW (ref 26.0–34.0)
MCHC: 32.5 g/dL (ref 30.0–36.0)
MCV: 78.5 fL — ABNORMAL LOW (ref 80.0–100.0)
Monocytes Absolute: 1.1 10*3/uL — ABNORMAL HIGH (ref 0.1–1.0)
Monocytes Relative: 7 %
Neutro Abs: 11.5 10*3/uL — ABNORMAL HIGH (ref 1.7–7.7)
Neutrophils Relative %: 78 %
Platelets: 353 10*3/uL (ref 150–400)
RBC: 5.21 MIL/uL — ABNORMAL HIGH (ref 3.87–5.11)
RDW: 13.7 % (ref 11.5–15.5)
WBC: 14.8 10*3/uL — ABNORMAL HIGH (ref 4.0–10.5)
nRBC: 0 % (ref 0.0–0.2)

## 2020-08-01 LAB — RAPID URINE DRUG SCREEN, HOSP PERFORMED
Amphetamines: POSITIVE — AB
Barbiturates: NOT DETECTED
Benzodiazepines: POSITIVE — AB
Cocaine: NOT DETECTED
Opiates: POSITIVE — AB
Tetrahydrocannabinol: POSITIVE — AB

## 2020-08-01 LAB — SARS CORONAVIRUS 2 BY RT PCR (HOSPITAL ORDER, PERFORMED IN ~~LOC~~ HOSPITAL LAB): SARS Coronavirus 2: NEGATIVE

## 2020-08-01 LAB — POC URINE PREG, ED: Preg Test, Ur: NEGATIVE

## 2020-08-01 LAB — ETHANOL: Alcohol, Ethyl (B): <10 mg/dL (ref <10)

## 2020-08-01 MED ORDER — SODIUM CHLORIDE 0.9 % IV BOLUS
1000.0000 mL | Freq: Once | INTRAVENOUS | Status: AC
  Administered 2020-08-01: 1000 mL via INTRAVENOUS

## 2020-08-01 MED ORDER — POTASSIUM CHLORIDE CRYS ER 20 MEQ PO TBCR
40.0000 meq | EXTENDED_RELEASE_TABLET | Freq: Once | ORAL | Status: AC
Start: 2020-08-01 — End: 2020-08-01
  Administered 2020-08-01: 40 meq via ORAL
  Filled 2020-08-01: qty 2

## 2020-08-01 MED ORDER — ZIPRASIDONE MESYLATE 20 MG IM SOLR: 10.0000 mg | Freq: Once | INTRAMUSCULAR | Status: AC

## 2020-08-01 MED ORDER — STERILE WATER FOR INJECTION IJ SOLN
INTRAMUSCULAR | Status: AC
  Filled 2020-08-01: qty 10

## 2020-08-01 MED ORDER — ZIPRASIDONE MESYLATE 20 MG IM SOLR
INTRAMUSCULAR | Status: AC
  Administered 2020-08-01: 10 mg via INTRAMUSCULAR
  Filled 2020-08-01: qty 20

## 2020-08-01 NOTE — ED Notes (Signed)
Attempted to call mother to make aware of discharge

## 2020-08-01 NOTE — ED Notes (Signed)
Pt transported to CT at this time with [facility] at bedside.
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
## 2020-08-01 NOTE — ED Notes (Signed)
Pt's belongings placed in EMS locker room containing of a shirt, dress, and a silver necklace with a pendant. Security informed this tech to just put the necklace in the pt's belongings with her clothes.

## 2020-08-01 NOTE — Discharge Instructions (Signed)
Follow-up with [facility] for your substance abuse.  If you continue using drugs you will die

## 2020-08-01 NOTE — ED Provider Notes (Signed)
[facility] EMERGENCY DEPARTMENT Provider Note   CSN: [id number] Arrival date & time: 08/01/20  2025     History Chief Complaint  Patient presents with  . Delusional    Heidi [name] is a 34 y.o. female.  Patient is a known drug abuser.  She was found in the street running at cars by the police and brought over here.  The history is provided by the patient and the police. No language interpreter was used.  Altered Mental Status Presenting symptoms: behavior changes   Severity:  Severe Most recent episode:  Today Episode history:  Multiple Timing:  Constant Progression:  Unchanged Chronicity:  Recurrent Context: not alcohol use   Context comment:  History of substance abuse Associated symptoms: no abdominal pain, no hallucinations, no headaches, no rash and no seizures        Past Medical History:  Diagnosis Date  . Abnormal Pap smear of vagina   . Anxiety   . Chronic shoulder pain   . Drug use   . Hepatitis C   . Polysubstance abuse [facility])     Patient Active Problem List   Diagnosis Date Noted  . Altered mental state 09/06/2016  . AKI (acute kidney injury) (HCC) 09/06/2016  . Normocytic anemia 09/06/2016  . Obtundation 09/06/2016  . Altered mental status   . Substance induced mood disorder (HCC) 05/12/2016  . Elevated LFTs 06/14/2014  . Hepatitis C, acute 06/05/2014  . Polysubstance abuse (HCC) 06/04/2014  . Acute hepatitis 06/03/2014  . Cholestatic jaundice 06/03/2014  . Cystitis, acute 06/03/2014  . Chronic pain syndrome 06/03/2014  . Hypokalemia 06/03/2014  . CLOSED FRACTURE OF MEDIAL MALLEOLUS 06/20/2010  . ANKLE SPRAIN 01/07/2008    Past Surgical History:  Procedure Laterality Date  . ANKLE SURGERY       OB History   No obstetric history on file.     Family History  Problem Relation Age of Onset  . Cirrhosis Father   . Hepatitis C Father     Social History   Tobacco Use  . Smoking status: Current Every Day Smoker    Packs/day:  1.00    Types: Cigarettes  . Smokeless tobacco: Never Used  Substance Use Topics  . Alcohol use: No  . Drug use: Yes    Types: Heroin, Marijuana, Methamphetamines, IV    Comment: Heroin     Home Medications Prior to Admission medications   Medication Sig Start Date End Date Taking? Authorizing Provider  clindamycin (CLEOCIN) 150 MG capsule Take 2 capsules (300 mg total) by mouth 4 (four) times daily. 11/13/19   Triplett, Tammy, PA-C  levonorgestrel (MIRENA) 20 MCG/24HR IUD 1 each by Intrauterine route once.    [provider]    Allergies    Penicillins, Tylenol [acetaminophen], Venlafaxine, and Morphine and related  Review of Systems   Review of Systems  Constitutional: Negative for appetite change and fatigue.  HENT: Negative for congestion, ear discharge and sinus pressure.   Eyes: Negative for discharge.  Respiratory: Negative for cough.   Cardiovascular: Negative for chest pain.  Gastrointestinal: Negative for abdominal pain and diarrhea.  Genitourinary: Negative for frequency and hematuria.  Musculoskeletal: Negative for back pain.  Skin: Negative for rash.  Neurological: Negative for seizures and headaches.  Psychiatric/Behavioral: Negative for hallucinations.    Physical Exam Updated Vital Signs BP 116/74 (BP Location: Left Arm)   Pulse 86   Temp 97.9 F (36.6 C) (Oral)   Resp 14   Ht 5\' 7"  (1.702  m)   Wt 59 kg   SpO2 96%   BMI 20.36 kg/m   Physical Exam Vitals and nursing note reviewed.  Constitutional:      Appearance: She is well-developed. She is toxic-appearing.  HENT:     Head: Normocephalic.     Nose: Nose normal.  Eyes:     General: No scleral icterus.    Conjunctiva/sclera: Conjunctivae normal.  Neck:     Thyroid: No thyromegaly.  Cardiovascular:     Rate and Rhythm: Normal rate and regular rhythm.     Heart sounds: No murmur heard.  No friction rub. No gallop.   Pulmonary:     Breath sounds: No stridor. No wheezing or rales.   Chest:     Chest wall: No tenderness.  Abdominal:     General: There is no distension.     Tenderness: There is no abdominal tenderness. There is no rebound.  Musculoskeletal:        General: Normal range of motion.     Cervical back: Neck supple.  Lymphadenopathy:     Cervical: No cervical adenopathy.  Skin:    Findings: No erythema or rash.  Neurological:     Mental Status: She is alert.     Motor: No abnormal muscle tone.     Coordination: Coordination normal.     Comments: Patient oriented to person only and is delusional and combative  Psychiatric:     Comments: Patient delusional and combative     ED Results / Procedures / Treatments   Labs (all labs ordered are listed, but only abnormal results are displayed) Labs Reviewed  COMPREHENSIVE METABOLIC PANEL - Abnormal; Notable for the following components:      Result Value   Potassium 3.0 (*)    CO2 18 (*)    Glucose, Bld 114 (*)    BUN 26 (*)    Total Bilirubin 1.3 (*)    All other components within normal limits  RAPID URINE DRUG SCREEN, HOSP PERFORMED - Abnormal; Notable for the following components:   Opiates POSITIVE (*)    Benzodiazepines POSITIVE (*)    Amphetamines POSITIVE (*)    Tetrahydrocannabinol POSITIVE (*)    All other components within normal limits  CBC WITH DIFFERENTIAL/PLATELET - Abnormal; Notable for the following components:   WBC 14.8 (*)    RBC 5.21 (*)    MCV 78.5 (*)    MCH 25.5 (*)    Neutro Abs 11.5 (*)    Monocytes Absolute 1.1 (*)    All other components within normal limits  SARS CORONAVIRUS 2 BY RT PCR (HOSPITAL ORDER, PERFORMED IN [facility])  ETHANOL  POC URINE PREG, ED    EKG None  Radiology CT Head Wo Contrast  Result Date: 08/01/2020 CLINICAL DATA:  Altered mental status. EXAM: CT HEAD WITHOUT CONTRAST TECHNIQUE: Contiguous axial images were obtained from the base of the skull through the vertex without intravenous contrast. COMPARISON:  CT head dated  Apr 15, 2016. FINDINGS: Brain: No evidence of acute infarction, hemorrhage, hydrocephalus, extra-axial collection or mass lesion/mass effect. Vascular: No hyperdense vessel or unexpected calcification. Skull: Normal. Negative for fracture or focal lesion. Sinuses/Orbits: No acute finding. Other: None. IMPRESSION: 1. Normal noncontrast head CT. Electronically Signed   By: Obie Dredge M.D.   On: 08/01/2020 11:13    Procedures Procedures (including critical care time)  Medications Ordered in ED Medications  sterile water (preservative free) injection (has no administration in time range)  ziprasidone (  GEODON) injection 10 mg (10 mg Intramuscular Given 08/01/20 0907)  potassium chloride SA (KLOR-CON) CR tablet 40 mEq (40 mEq Oral Given 08/01/20 1400)  sodium chloride 0.9 % bolus 1,000 mL (1,000 mLs Intravenous New Bag/Given 08/01/20 1417)    ED Course  I have reviewed the triage vital signs and the nursing notes.  Pertinent labs & imaging results that were available during my care of the patient were reviewed by me and considered in my medical decision making (see chart for details). CRITICAL CARE Performed by: Bethann Berkshire Total critical care time: 45 minutes Critical care time was exclusive of separately billable procedures and treating other patients. Critical care was necessary to treat or prevent imminent or life-threatening deterioration. Critical care was time spent personally by me on the following activities: development of treatment plan with patient and/or surrogate as well as nursing, discussions with consultants, evaluation of patient's response to treatment, examination of patient, obtaining history from patient or surrogate, ordering and performing treatments and interventions, ordering and review of laboratory studies, ordering and review of radiographic studies, pulse oximetry and re-evaluation of patient's condition.    MDM Rules/Calculators/A&P                          Patient with significant substance abuse.  Patient is not suicidal she was observed many hours in the ED and is alert and oriented now.  She is referred to behavioral health for substance abuse      This patient presents to the ED for concern of altered mental status, this involves an extensive number of treatment options, and is a complaint that carries with it a high risk of complications and morbidity.  The differential diagnosis includes substance abuse metabolic encephalopathy cerebral hemorrhage   Lab Tests:   I Ordered, reviewed, and interpreted labs, which included CBC chemistries urine drug screen which showed low potassium of 3.0 and elevated white blood count along with urine drug screen that is positive for opiates benzos man amphetamines and marijuana  Medicines ordered:   I ordered medication Geodon for altered mental status  Imaging Studies ordered:   I ordered imaging studies which included CT the head  I independently visualized and interpreted imaging which showed negative  Additional history obtained:   Additional history obtained from police  Previous records obtained and reviewed.  Consultations Obtained:   Reevaluation:  After the interventions stated above, I reevaluated the patient and found improved  Critical Interventions:  .    Final Clinical Impression(s) / ED Diagnoses Final diagnoses:  None    Rx / DC Orders ED Discharge Orders    None       Bethann Berkshire, MD 08/01/20 1512

## 2020-08-01 NOTE — ED Notes (Signed)
Pt was given a sprite to drink and got upset because the [employer] officer and threw her drink at the officer. Robin RN and Dr. Estell Harpin notified.

## 2020-08-01 NOTE — ED Triage Notes (Signed)
[employer] brought pt in to ed because she was found to be running in and out of traffic.  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

## 2020-08-01 NOTE — ED Notes (Signed)
Pt able to ambulate in hallway to [facility]

## 2020-08-01 NOTE — ED Notes (Signed)
Spoke with mother reports she is at [facility] and is actively looking for transport home

## 2020-10-16 ENCOUNTER — Other Ambulatory Visit: Payer: Self-pay

## 2020-10-16 ENCOUNTER — Inpatient Hospital Stay (HOSPITAL_COMMUNITY)
Admission: EM | Admit: 2020-10-16 | Discharge: 2020-10-21 | DRG: 871 | Disposition: A | Discharge status: Home / Self Care | Payer: Medicaid Other | Attending: Student | Admitting: Student

## 2020-10-16 ENCOUNTER — Emergency Department (HOSPITAL_COMMUNITY): Payer: Medicaid Other

## 2020-10-16 ENCOUNTER — Encounter (HOSPITAL_COMMUNITY): Payer: Self-pay | Admitting: Emergency Medicine

## 2020-10-16 DIAGNOSIS — R0902 Hypoxemia: Secondary | ICD-10-CM

## 2020-10-16 DIAGNOSIS — A419 Sepsis, unspecified organism: Secondary | ICD-10-CM | POA: Diagnosis present

## 2020-10-16 DIAGNOSIS — G928 Other toxic encephalopathy: Secondary | ICD-10-CM | POA: Diagnosis present

## 2020-10-16 DIAGNOSIS — E873 Alkalosis: Secondary | ICD-10-CM | POA: Diagnosis present

## 2020-10-16 DIAGNOSIS — F1123 Opioid dependence with withdrawal: Secondary | ICD-10-CM | POA: Diagnosis present

## 2020-10-16 DIAGNOSIS — F17210 Nicotine dependence, cigarettes, uncomplicated: Secondary | ICD-10-CM | POA: Diagnosis present

## 2020-10-16 DIAGNOSIS — E876 Hypokalemia: Secondary | ICD-10-CM | POA: Diagnosis present

## 2020-10-16 DIAGNOSIS — G894 Chronic pain syndrome: Secondary | ICD-10-CM | POA: Diagnosis present

## 2020-10-16 DIAGNOSIS — A4189 Other specified sepsis: Principal | ICD-10-CM | POA: Diagnosis present

## 2020-10-16 DIAGNOSIS — Z975 Presence of (intrauterine) contraceptive device: Secondary | ICD-10-CM

## 2020-10-16 DIAGNOSIS — F1124 Opioid dependence with opioid-induced mood disorder: Secondary | ICD-10-CM | POA: Diagnosis present

## 2020-10-16 DIAGNOSIS — Z7151 Drug abuse counseling and surveillance of drug abuser: Secondary | ICD-10-CM

## 2020-10-16 DIAGNOSIS — M25519 Pain in unspecified shoulder: Secondary | ICD-10-CM | POA: Diagnosis present

## 2020-10-16 DIAGNOSIS — R4182 Altered mental status, unspecified: Secondary | ICD-10-CM | POA: Diagnosis present

## 2020-10-16 DIAGNOSIS — U071 COVID-19: Secondary | ICD-10-CM | POA: Diagnosis present

## 2020-10-16 DIAGNOSIS — J1282 Pneumonia due to coronavirus disease 2019: Principal | ICD-10-CM | POA: Diagnosis present

## 2020-10-16 DIAGNOSIS — Z79899 Other long term (current) drug therapy: Secondary | ICD-10-CM

## 2020-10-16 DIAGNOSIS — Z885 Allergy status to narcotic agent status: Secondary | ICD-10-CM

## 2020-10-16 DIAGNOSIS — F1193 Opioid use, unspecified with withdrawal: Secondary | ICD-10-CM | POA: Diagnosis present

## 2020-10-16 DIAGNOSIS — F1910 Other psychoactive substance abuse, uncomplicated: Secondary | ICD-10-CM | POA: Diagnosis present

## 2020-10-16 DIAGNOSIS — F1994 Other psychoactive substance use, unspecified with psychoactive substance-induced mood disorder: Secondary | ICD-10-CM | POA: Diagnosis present

## 2020-10-16 DIAGNOSIS — Z880 Allergy status to penicillin: Secondary | ICD-10-CM

## 2020-10-16 DIAGNOSIS — J690 Pneumonitis due to inhalation of food and vomit: Secondary | ICD-10-CM | POA: Diagnosis present

## 2020-10-16 DIAGNOSIS — Z888 Allergy status to other drugs, medicaments and biological substances status: Secondary | ICD-10-CM

## 2020-10-16 DIAGNOSIS — R6520 Severe sepsis without septic shock: Secondary | ICD-10-CM | POA: Diagnosis present

## 2020-10-16 DIAGNOSIS — Z8619 Personal history of other infectious and parasitic diseases: Secondary | ICD-10-CM

## 2020-10-16 MED ORDER — SODIUM CHLORIDE 0.9 % IV BOLUS
1000.0000 mL | Freq: Once | INTRAVENOUS | Status: AC
  Administered 2020-10-16: 1000 mL via INTRAVENOUS

## 2020-10-16 NOTE — ED Triage Notes (Signed)
Pt has been trying to detox off heroin went to [facility] yesterday.pt has been hyperventilating for hours and is lethargic.

## 2020-10-16 NOTE — ED Provider Notes (Signed)
[facility] EMERGENCY DEPARTMENT Provider Note   CSN: [id number] Arrival date & time: 10/16/20  1948     History Chief Complaint  Patient presents with  . Altered Mental Status    Heidi [name] is a 34 y.o. female.  HPI     This is a 34 year old female with history of polysubstance abuse who presents with heroin detox.  Patient very difficult to obtain a history from.  Upon my arrival into the room, patient hyperventilating with her eyes closed.  She would not open her eyes to voice.  Finally I said that I could not help her and that she talk to me.  She opened up her eyes.  Reports that she is detoxing from heroin.  Last heroin use was 3 days ago.  She describes diarrhea and nausea.  I attempted to ask multiple directed questions and she often would not respond and keep her eyes closed.  Level 5 caveat as patient is uncooperative.  Chart reviewed.  Patient seen and evaluated on 10/31 for drug overdose.  Reportedly received Narcan.  Was observed in the ER at [facility] without any ill effects.  Past Medical History:  Diagnosis Date  . Abnormal Pap smear of vagina   . Anxiety   . Chronic shoulder pain   . Drug use   . Hepatitis C   . Polysubstance abuse [facility])     Patient Active Problem List   Diagnosis Date Noted  . Altered mental state 09/06/2016  . AKI (acute kidney injury) (HCC) 09/06/2016  . Normocytic anemia 09/06/2016  . Obtundation 09/06/2016  . Altered mental status   . Substance induced mood disorder (HCC) 05/12/2016  . Elevated LFTs 06/14/2014  . Hepatitis C, acute 06/05/2014  . Polysubstance abuse (HCC) 06/04/2014  . Acute hepatitis 06/03/2014  . Cholestatic jaundice 06/03/2014  . Cystitis, acute 06/03/2014  . Chronic pain syndrome 06/03/2014  . Hypokalemia 06/03/2014  . CLOSED FRACTURE OF MEDIAL MALLEOLUS 06/20/2010  . ANKLE SPRAIN 01/07/2008    Past Surgical History:  Procedure Laterality Date  . ANKLE SURGERY       OB History   No obstetric  history on file.     Family History  Problem Relation Age of Onset  . Cirrhosis Father   . Hepatitis C Father     Social History   Tobacco Use  . Smoking status: Current Every Day Smoker    Packs/day: 1.00    Types: Cigarettes  . Smokeless tobacco: Never Used  Substance Use Topics  . Alcohol use: No  . Drug use: Yes    Types: Heroin, Marijuana, Methamphetamines, IV    Comment: Heroin     Home Medications Prior to Admission medications   Medication Sig Start Date End Date Taking? Authorizing Provider  clindamycin (CLEOCIN) 150 MG capsule Take 2 capsules (300 mg total) by mouth 4 (four) times daily. 11/13/19   Triplett, Tammy, PA-C  levonorgestrel (MIRENA) 20 MCG/24HR IUD 1 each by Intrauterine route once.    [provider]    Allergies    Penicillins, Tylenol [acetaminophen], Venlafaxine, and Morphine and related  Review of Systems   Review of Systems  Constitutional: Positive for fever.  Respiratory: Negative for shortness of breath.   Cardiovascular: Negative for chest pain.  Gastrointestinal: Positive for diarrhea and nausea. Negative for abdominal pain, blood in stool, constipation and vomiting.  Genitourinary: Negative for dysuria.  All other systems reviewed and are negative.   Physical Exam Updated Vital Signs BP 132/82  Pulse 81   Temp (!) 101.9 F (38.8 C) (Rectal)   Resp (!) 44   SpO2 95%   Physical Exam Vitals and nursing note reviewed.  Constitutional:      Appearance: She is well-developed.     Comments: Somnolent but arousable, no acute distress, hyperventilating  HENT:     Head: Normocephalic and atraumatic.     Nose: Nose normal.     Mouth/Throat:     Mouth: Mucous membranes are dry.  Eyes:     Pupils: Pupils are equal, round, and reactive to light.  Cardiovascular:     Rate and Rhythm: Normal rate and regular rhythm.     Heart sounds: Normal heart sounds.  Pulmonary:     Effort: Pulmonary effort is normal. No respiratory  distress.     Breath sounds: No wheezing.     Comments: Increased respiratory rate, rales noted, low 90s on room air Abdominal:     General: Bowel sounds are normal.     Palpations: Abdomen is soft.     Tenderness: There is no abdominal tenderness.  Musculoskeletal:     Cervical back: Neck supple.     Right lower leg: No edema.     Left lower leg: No edema.  Skin:    General: Skin is warm and dry.  Neurological:     General: No focal deficit present.  Psychiatric:        Mood and Affect: Mood normal.     ED Results / Procedures / Treatments   Labs (all labs ordered are listed, but only abnormal results are displayed) Labs Reviewed  RESPIRATORY PANEL BY RT PCR (FLU A&B, COVID) - Abnormal; Notable for the following components:      Result Value   SARS Coronavirus 2 by RT PCR POSITIVE (*)    All other components within normal limits  CBC WITH DIFFERENTIAL/PLATELET - Abnormal; Notable for the following components:   WBC 17.8 (*)    MCH 25.6 (*)    Neutro Abs 15.7 (*)    All other components within normal limits  BASIC METABOLIC PANEL - Abnormal; Notable for the following components:   Potassium 2.5 (*)    CO2 20 (*)    Glucose, Bld 121 (*)    Calcium 8.5 (*)    All other components within normal limits  ACETAMINOPHEN LEVEL - Abnormal; Notable for the following components:   Acetaminophen (Tylenol), Serum <10 (*)    All other components within normal limits  SALICYLATE LEVEL - Abnormal; Notable for the following components:   Salicylate Lvl <7.0 (*)    All other components within normal limits  BLOOD GAS, ARTERIAL - Abnormal; Notable for the following components:   pH, Arterial 7.509 (*)    pCO2 arterial 28.3 (*)    pO2, Arterial 66.0 (*)    All other components within normal limits  CULTURE, BLOOD (ROUTINE X 2)  CULTURE, BLOOD (ROUTINE X 2)  LACTIC ACID, PLASMA  LACTIC ACID, PLASMA  URINALYSIS, ROUTINE W REFLEX MICROSCOPIC  PREGNANCY, URINE  RAPID URINE DRUG  SCREEN, HOSP PERFORMED  D-DIMER, QUANTITATIVE (NOT AT [facility])  PROCALCITONIN  LACTATE DEHYDROGENASE  FERRITIN  TRIGLYCERIDES  FIBRINOGEN  C-REACTIVE PROTEIN    EKG EKG Interpretation  Date/Time:  Monday October 16 2020 19:52:54 EDT Ventricular Rate:  92 PR Interval:    QRS Duration: 91 QT Interval:  380 QTC Calculation: 471 R Axis:   82 Text Interpretation: Sinus rhythm Biatrial enlargement Nonspecific T abnrm, anterolateral leads Confirmed  by Ross MarcusHorton, [employer] ([id number]) on 10/16/2020 11:33:43 PM   Radiology DG Chest Portable 1 View  Result Date: 10/17/2020 CLINICAL DATA:  Hypoxia, rales EXAM: PORTABLE CHEST 1 VIEW COMPARISON:  04/15/2016 FINDINGS: There has developed focal airspace infiltrate within the right lung base, likely infectious in the acute setting. No pneumothorax or pleural effusion. Cardiac size within normal limits. No acute bone abnormality. IMPRESSION: Right basilar pneumonic infiltrate. Electronically Signed   By: Helyn NumbersAshesh  Parikh MD   On: 10/17/2020 00:20    Procedures Procedures (including critical care time)  CRITICAL CARE Performed by: Shon Batonourtney F Staci Carver   Total critical care time: 45 minutes  Critical care time was exclusive of separately billable procedures and treating other patients.  Critical care was necessary to treat or prevent imminent or life-threatening deterioration.  Critical care was time spent personally by me on the following activities: development of treatment plan with patient and/or surrogate as well as nursing, discussions with consultants, evaluation of patient's response to treatment, examination of patient, obtaining history from patient or surrogate, ordering and performing treatments and interventions, ordering and review of laboratory studies, ordering and review of radiographic studies, pulse oximetry and re-evaluation of patient's condition.   Medications Ordered in ED Medications  potassium chloride 10 mEq in 100 mL IVPB (has  no administration in time range)  magnesium sulfate IVPB 2 g 50 mL (has no administration in time range)  cefTRIAXone (ROCEPHIN) 1 g in sodium chloride 0.9 % 100 mL IVPB (has no administration in time range)  azithromycin (ZITHROMAX) 500 mg in sodium chloride 0.9 % 250 mL IVPB (has no administration in time range)  metroNIDAZOLE (FLAGYL) IVPB 500 mg (has no administration in time range)  dexamethasone (DECADRON) injection 10 mg (has no administration in time range)  sodium chloride 0.9 % bolus 1,000 mL (1,000 mLs Intravenous New Bag/Given 10/16/20 2350)    ED Course  I have reviewed the triage vital signs and the nursing notes.  Pertinent labs & imaging results that were available during my care of the patient were reviewed by me and considered in my medical decision making (see chart for details).    MDM Rules/Calculators/A&P                          Patient presents with concerns for withdrawal.  She is very uncooperative on history taking.  Notably she is febrile to 101.9 and hyperventilating.  Unclear whether she is intentionally hyperventilating or if this is secondary to metabolic process as she is difficult to obtain history from.  Her ABCs are intact.  She is satting low 90s and required 2 L of nasal cannula.  She has rales on her pulmonary exam.  Given recent overdose and Narcan, she would be at high risk for aspiration.  Unclear whether she has had ongoing fevers at home.  Unknown vaccine status.  Work-up initiated.  Chest x-ray shows a right lower lobe infiltrate.  Given concern for aspiration, patient was covered with Rocephin, azithromycin, and Flagyl.  COVID-19 testing office is sent.  Work-up notable for leukocytosis to 17.  She has hypokalemia with potassium of 2.9.  This was replaced with both potassium and magnesium.  Covid testing is positive.  Subsequently additional lab work was obtained to restratify the patient is requiring oxygen.  She was given a dose of Decadron and  remdesivir was ordered.  Clinically, still concern for superimposed bacterial pneumonia.  ABG shows a respiratory alkalosis.  We will plan  for admission to the hospital.  Final Clinical Impression(s) / ED Diagnoses Final diagnoses:  Pneumonia due to COVID-19 virus  Aspiration pneumonia of right lower lobe, unspecified aspiration pneumonia type (HCC)  Hypoxia  Polysubstance abuse (HCC)  Hypokalemia    Rx / DC Orders ED Discharge Orders    None       Shon Baton, MD 10/17/20 0126

## 2020-10-17 ENCOUNTER — Encounter (HOSPITAL_COMMUNITY): Payer: Self-pay | Admitting: Internal Medicine

## 2020-10-17 DIAGNOSIS — Z8619 Personal history of other infectious and parasitic diseases: Secondary | ICD-10-CM | POA: Diagnosis not present

## 2020-10-17 DIAGNOSIS — J189 Pneumonia, unspecified organism: Secondary | ICD-10-CM

## 2020-10-17 DIAGNOSIS — Z7151 Drug abuse counseling and surveillance of drug abuser: Secondary | ICD-10-CM | POA: Diagnosis not present

## 2020-10-17 DIAGNOSIS — R4182 Altered mental status, unspecified: Secondary | ICD-10-CM

## 2020-10-17 DIAGNOSIS — R6520 Severe sepsis without septic shock: Secondary | ICD-10-CM | POA: Diagnosis present

## 2020-10-17 DIAGNOSIS — E873 Alkalosis: Secondary | ICD-10-CM | POA: Diagnosis not present

## 2020-10-17 DIAGNOSIS — J1282 Pneumonia due to coronavirus disease 2019: Secondary | ICD-10-CM

## 2020-10-17 DIAGNOSIS — G894 Chronic pain syndrome: Secondary | ICD-10-CM | POA: Diagnosis present

## 2020-10-17 DIAGNOSIS — U071 COVID-19: Secondary | ICD-10-CM | POA: Diagnosis present

## 2020-10-17 DIAGNOSIS — J690 Pneumonitis due to inhalation of food and vomit: Secondary | ICD-10-CM | POA: Diagnosis not present

## 2020-10-17 DIAGNOSIS — F1123 Opioid dependence with withdrawal: Secondary | ICD-10-CM | POA: Diagnosis present

## 2020-10-17 DIAGNOSIS — F17210 Nicotine dependence, cigarettes, uncomplicated: Secondary | ICD-10-CM | POA: Diagnosis not present

## 2020-10-17 DIAGNOSIS — M25519 Pain in unspecified shoulder: Secondary | ICD-10-CM | POA: Diagnosis present

## 2020-10-17 DIAGNOSIS — F1193 Opioid use, unspecified with withdrawal: Secondary | ICD-10-CM | POA: Diagnosis present

## 2020-10-17 DIAGNOSIS — F1124 Opioid dependence with opioid-induced mood disorder: Secondary | ICD-10-CM | POA: Diagnosis not present

## 2020-10-17 DIAGNOSIS — A4189 Other specified sepsis: Secondary | ICD-10-CM | POA: Diagnosis present

## 2020-10-17 DIAGNOSIS — Z79899 Other long term (current) drug therapy: Secondary | ICD-10-CM | POA: Diagnosis not present

## 2020-10-17 DIAGNOSIS — E876 Hypokalemia: Secondary | ICD-10-CM | POA: Diagnosis present

## 2020-10-17 DIAGNOSIS — G928 Other toxic encephalopathy: Secondary | ICD-10-CM | POA: Diagnosis present

## 2020-10-17 DIAGNOSIS — Z885 Allergy status to narcotic agent status: Secondary | ICD-10-CM | POA: Diagnosis not present

## 2020-10-17 DIAGNOSIS — Z880 Allergy status to penicillin: Secondary | ICD-10-CM | POA: Diagnosis not present

## 2020-10-17 DIAGNOSIS — Z975 Presence of (intrauterine) contraceptive device: Secondary | ICD-10-CM | POA: Diagnosis not present

## 2020-10-17 DIAGNOSIS — R0902 Hypoxemia: Secondary | ICD-10-CM | POA: Diagnosis present

## 2020-10-17 DIAGNOSIS — A419 Sepsis, unspecified organism: Secondary | ICD-10-CM | POA: Diagnosis present

## 2020-10-17 DIAGNOSIS — Z888 Allergy status to other drugs, medicaments and biological substances status: Secondary | ICD-10-CM | POA: Diagnosis not present

## 2020-10-17 LAB — BASIC METABOLIC PANEL
Anion gap: 7 (ref 5–15)
BUN: 17 mg/dL (ref 6–20)
CO2: 20 mmol/L — ABNORMAL LOW (ref 22–32)
Calcium: 8.5 mg/dL — ABNORMAL LOW (ref 8.9–10.3)
Chloride: 110 mmol/L (ref 98–111)
Creatinine, Ser: 0.55 mg/dL (ref 0.44–1.00)
GFR, Estimated: >60 mL/min (ref >60)
Glucose, Bld: 121 mg/dL — ABNORMAL HIGH (ref 70–99)
Potassium: 2.5 mmol/L — CL (ref 3.5–5.1)
Sodium: 137 mmol/L (ref 135–145)

## 2020-10-17 LAB — CBC WITH DIFFERENTIAL/PLATELET
Abs Immature Granulocytes: 0.06 10*3/uL (ref 0.00–0.07)
Basophils Absolute: 0 10*3/uL (ref 0.0–0.1)
Basophils Relative: 0 %
Eosinophils Absolute: 0.1 10*3/uL (ref 0.0–0.5)
Eosinophils Relative: 0 %
HCT: 42.1 % (ref 36.0–46.0)
Hemoglobin: 13.1 g/dL (ref 12.0–15.0)
Immature Granulocytes: 0 %
Lymphocytes Relative: 7 %
Lymphs Abs: 1.2 10*3/uL (ref 0.7–4.0)
MCH: 25.6 pg — ABNORMAL LOW (ref 26.0–34.0)
MCHC: 31.1 g/dL (ref 30.0–36.0)
MCV: 82.4 fL (ref 80.0–100.0)
Monocytes Absolute: 0.8 10*3/uL (ref 0.1–1.0)
Monocytes Relative: 4 %
Neutro Abs: 15.7 10*3/uL — ABNORMAL HIGH (ref 1.7–7.7)
Neutrophils Relative %: 89 %
Platelets: 277 10*3/uL (ref 150–400)
RBC: 5.11 MIL/uL (ref 3.87–5.11)
RDW: 14.6 % (ref 11.5–15.5)
WBC: 17.8 10*3/uL — ABNORMAL HIGH (ref 4.0–10.5)
nRBC: 0 % (ref 0.0–0.2)

## 2020-10-17 LAB — TRIGLYCERIDES: Triglycerides: 75 mg/dL (ref <150)

## 2020-10-17 LAB — BLOOD GAS, ARTERIAL
Acid-base deficit: 0.4 mmol/L (ref 0.0–2.0)
Bicarbonate: 25 mmol/L (ref 20.0–28.0)
FIO2: 32
O2 Saturation: 92.7 %
Patient temperature: 38
pCO2 arterial: 28.3 mmHg — ABNORMAL LOW (ref 32.0–48.0)
pH, Arterial: 7.509 — ABNORMAL HIGH (ref 7.350–7.450)
pO2, Arterial: 66 mmHg — ABNORMAL LOW (ref 83.0–108.0)

## 2020-10-17 LAB — LACTATE DEHYDROGENASE: LDH: 120 U/L (ref 98–192)

## 2020-10-17 LAB — PROCALCITONIN: Procalcitonin: <0.1 ng/mL

## 2020-10-17 LAB — D-DIMER, QUANTITATIVE: D-Dimer, Quant: 0.66 ug/mL-FEU — ABNORMAL HIGH (ref 0.00–0.50)

## 2020-10-17 LAB — CBG MONITORING, ED: Glucose-Capillary: 102 mg/dL — ABNORMAL HIGH (ref 70–99)

## 2020-10-17 LAB — FIBRINOGEN: Fibrinogen: 399 mg/dL (ref 210–475)

## 2020-10-17 LAB — C-REACTIVE PROTEIN: CRP: 2.6 mg/dL — ABNORMAL HIGH (ref <1.0)

## 2020-10-17 LAB — FERRITIN: Ferritin: 106 ng/mL (ref 11–307)

## 2020-10-17 LAB — RESPIRATORY PANEL BY RT PCR (FLU A&B, COVID)
Influenza A by PCR: NEGATIVE
Influenza B by PCR: NEGATIVE
SARS Coronavirus 2 by RT PCR: POSITIVE — AB

## 2020-10-17 LAB — SALICYLATE LEVEL: Salicylate Lvl: <7 mg/dL — ABNORMAL LOW (ref 7.0–30.0)

## 2020-10-17 LAB — LACTIC ACID, PLASMA: Lactic Acid, Venous: 1.2 mmol/L (ref 0.5–1.9)

## 2020-10-17 LAB — ACETAMINOPHEN LEVEL: Acetaminophen (Tylenol), Serum: <10 ug/mL — ABNORMAL LOW (ref 10–30)

## 2020-10-17 MED ORDER — SODIUM CHLORIDE 0.9 % IV SOLN
1.0000 g | Freq: Once | INTRAVENOUS | Status: AC
  Administered 2020-10-17: 1 g via INTRAVENOUS
  Filled 2020-10-17: qty 10

## 2020-10-17 MED ORDER — ALBUTEROL SULFATE HFA 108 (90 BASE) MCG/ACT IN AERS
2.0000 | INHALATION_SPRAY | Freq: Four times a day (QID) | RESPIRATORY_TRACT | Status: DC
  Administered 2020-10-17 – 2020-10-19 (×11): 2 via RESPIRATORY_TRACT
  Filled 2020-10-17 (×3): qty 6.7

## 2020-10-17 MED ORDER — FENTANYL 12 MCG/HR TD PT72
1.0000 | MEDICATED_PATCH | TRANSDERMAL | Status: DC
  Administered 2020-10-17 – 2020-10-20 (×2): 1 via TRANSDERMAL
  Filled 2020-10-17 (×2): qty 1

## 2020-10-17 MED ORDER — CLONIDINE HCL 0.2 MG/24HR TD PTWK
0.2000 mg | MEDICATED_PATCH | TRANSDERMAL | Status: DC
  Administered 2020-10-17: 0.2 mg via TRANSDERMAL
  Filled 2020-10-17 (×2): qty 1

## 2020-10-17 MED ORDER — MAGNESIUM SULFATE 2 GM/50ML IV SOLN
2.0000 g | Freq: Once | INTRAVENOUS | Status: AC
  Administered 2020-10-17: 2 g via INTRAVENOUS
  Filled 2020-10-17: qty 50

## 2020-10-17 MED ORDER — ENOXAPARIN SODIUM 40 MG/0.4ML ~~LOC~~ SOLN
40.0000 mg | SUBCUTANEOUS | Status: DC
  Administered 2020-10-17 – 2020-10-20 (×4): 40 mg via SUBCUTANEOUS
  Filled 2020-10-17 (×5): qty 0.4

## 2020-10-17 MED ORDER — ASCORBIC ACID 500 MG PO TABS
500.0000 mg | ORAL_TABLET | Freq: Every day | ORAL | Status: DC
  Administered 2020-10-17 – 2020-10-21 (×5): 500 mg via ORAL
  Filled 2020-10-17 (×5): qty 1

## 2020-10-17 MED ORDER — ZINC SULFATE 220 (50 ZN) MG PO CAPS
220.0000 mg | ORAL_CAPSULE | Freq: Every day | ORAL | Status: DC
  Administered 2020-10-17 – 2020-10-21 (×5): 220 mg via ORAL
  Filled 2020-10-17 (×5): qty 1

## 2020-10-17 MED ORDER — SODIUM CHLORIDE 0.9 % IV SOLN
100.0000 mg | INTRAVENOUS | Status: AC
  Administered 2020-10-17 (×2): 100 mg via INTRAVENOUS
  Filled 2020-10-17: qty 20

## 2020-10-17 MED ORDER — METRONIDAZOLE IN NACL 5-0.79 MG/ML-% IV SOLN
500.0000 mg | Freq: Once | INTRAVENOUS | Status: AC
  Administered 2020-10-17: 500 mg via INTRAVENOUS
  Filled 2020-10-17: qty 100

## 2020-10-17 MED ORDER — METRONIDAZOLE IN NACL 5-0.79 MG/ML-% IV SOLN
500.0000 mg | Freq: Three times a day (TID) | INTRAVENOUS | Status: DC
  Administered 2020-10-17 – 2020-10-18 (×4): 500 mg via INTRAVENOUS
  Filled 2020-10-17 (×4): qty 100

## 2020-10-17 MED ORDER — FENTANYL CITRATE (PF) 100 MCG/2ML IJ SOLN
100.0000 ug | Freq: Once | INTRAMUSCULAR | Status: AC
  Administered 2020-10-17: 100 ug via INTRAVENOUS
  Filled 2020-10-17: qty 2

## 2020-10-17 MED ORDER — SODIUM CHLORIDE 0.9 % IV SOLN
100.0000 mg | Freq: Every day | INTRAVENOUS | Status: AC
  Administered 2020-10-18 – 2020-10-21 (×4): 100 mg via INTRAVENOUS
  Filled 2020-10-17 (×4): qty 20

## 2020-10-17 MED ORDER — SODIUM CHLORIDE 0.9 % IV SOLN
500.0000 mg | INTRAVENOUS | Status: DC
  Administered 2020-10-17 (×2): 500 mg via INTRAVENOUS
  Filled 2020-10-17 (×2): qty 500

## 2020-10-17 MED ORDER — POTASSIUM CHLORIDE CRYS ER 20 MEQ PO TBCR: 40.0000 meq | EXTENDED_RELEASE_TABLET | Freq: Once | ORAL | Status: DC

## 2020-10-17 MED ORDER — DEXAMETHASONE SODIUM PHOSPHATE 10 MG/ML IJ SOLN: 10.0000 mg | Freq: Once | INTRAMUSCULAR | Status: DC

## 2020-10-17 MED ORDER — ACETAMINOPHEN 325 MG PO TABS: 650.0000 mg | ORAL_TABLET | Freq: Four times a day (QID) | ORAL | Status: DC | PRN

## 2020-10-17 MED ORDER — ONDANSETRON HCL 4 MG PO TABS: 4.0000 mg | ORAL_TABLET | Freq: Four times a day (QID) | ORAL | Status: DC | PRN

## 2020-10-17 MED ORDER — IBUPROFEN 200 MG PO TABS
400.0000 mg | ORAL_TABLET | Freq: Four times a day (QID) | ORAL | Status: DC | PRN
  Administered 2020-10-17 – 2020-10-18 (×3): 400 mg via ORAL
  Filled 2020-10-17 (×3): qty 2

## 2020-10-17 MED ORDER — POTASSIUM CHLORIDE 10 MEQ/100ML IV SOLN
10.0000 meq | INTRAVENOUS | Status: AC
  Administered 2020-10-17 (×2): 10 meq via INTRAVENOUS
  Filled 2020-10-17 (×2): qty 100

## 2020-10-17 MED ORDER — GUAIFENESIN-DM 100-10 MG/5ML PO SYRP
10.0000 mL | ORAL_SOLUTION | ORAL | Status: DC | PRN
  Administered 2020-10-17 – 2020-10-18 (×3): 10 mL via ORAL
  Filled 2020-10-17 (×3): qty 10

## 2020-10-17 MED ORDER — ACETAMINOPHEN 650 MG RE SUPP: 650.0000 mg | Freq: Four times a day (QID) | RECTAL | Status: DC | PRN

## 2020-10-17 MED ORDER — GUAIFENESIN ER 600 MG PO TB12
600.0000 mg | ORAL_TABLET | Freq: Two times a day (BID) | ORAL | Status: DC
  Administered 2020-10-17 – 2020-10-21 (×9): 600 mg via ORAL
  Filled 2020-10-17 (×9): qty 1

## 2020-10-17 MED ORDER — MAGNESIUM SULFATE 2 GM/50ML IV SOLN: 2.0000 g | Freq: Once | INTRAVENOUS | Status: DC

## 2020-10-17 MED ORDER — ONDANSETRON HCL 4 MG/2ML IJ SOLN: 4.0000 mg | Freq: Four times a day (QID) | INTRAMUSCULAR | Status: DC | PRN

## 2020-10-17 MED ORDER — DEXAMETHASONE SODIUM PHOSPHATE 10 MG/ML IJ SOLN
6.0000 mg | INTRAMUSCULAR | Status: DC
  Administered 2020-10-17 – 2020-10-20 (×5): 6 mg via INTRAVENOUS
  Filled 2020-10-17 (×5): qty 1

## 2020-10-17 MED ORDER — SODIUM CHLORIDE 0.9 % IV SOLN
1.0000 g | Freq: Once | INTRAVENOUS | Status: AC
  Administered 2020-10-18: 1 g via INTRAVENOUS
  Filled 2020-10-17: qty 10

## 2020-10-17 NOTE — ED Notes (Signed)
Spoke with Doug at [facility] to set up transportation at this time. 

## 2020-10-17 NOTE — ED Notes (Signed)
Date and time results received: 10/17/20 0117  Test: covid Critical Value: +  Name of Provider Notified: Dr. Wilkie Aye  Orders Received? Or Actions Taken?: n/a

## 2020-10-17 NOTE — ED Notes (Signed)
Date and time results received: 10/17/20 0053  Test: Potassium  Critical Value: 2.5  Name of Provider Notified: Dr. Wilkie Aye  Orders Received? Or Actions Taken?: n/a

## 2020-10-17 NOTE — Progress Notes (Signed)
°   10/17/20 1909  Assess: MEWS Score  Temp 100.3 F (37.9 C)  BP (!) 141/91  Pulse Rate 95  Resp (!) 41  SpO2 96 %  Assess: MEWS Score  MEWS Temp 0  MEWS Systolic 0  MEWS Pulse 0  MEWS RR 3  MEWS LOC 1  MEWS Score 4  MEWS Score Color Red  Assess: if the MEWS score is Yellow or Red  Were vital signs taken at a resting state? Yes  Focused Assessment No change from prior assessment  Early Detection of Sepsis Score *See Row Information* High  MEWS guidelines implemented *See Row Information* Yes  Take Vital Signs  Increase Vital Sign Frequency  Red: Q 1hr X 4 then Q 4hr X 4, if remains red, continue Q 4hrs  Escalate  MEWS: Escalate Red: discuss with charge nurse/RN and provider, consider discussing with RRT  Notify: Charge Nurse/RN  Name of Charge Nurse/RN Notified Hadassah Rana, RN  Date Charge Nurse/RN Notified 10/17/20  Time Charge Nurse/RN Notified [id number]

## 2020-10-17 NOTE — Plan of Care (Signed)

## 2020-10-17 NOTE — Progress Notes (Signed)
Patient seen and examined.  Admitted after midnight secondary to sepsis in the setting of pneumonia from COVID-19 and aspiration; underlying history of heroin use with concerns of acute withdrawal symptoms.  Currently requiring 7 L high flow nasal cannula supplementation, somnolent/lethargic but answering questions and protecting airways.  Patient met severe seppsis criteria on admission with elevated respiratory rate, temperature of 101.9 and hypoxia, pneumonia as source of infection and respiratory system as part of organ dysfunction.  Please refer to H&P written by Dr. Olevia Bowens on 10-17-20 for further info/details on admission.  Plan: -Decrease bed/nursing staff availability at [facility] will transfer patient to progressive bed at [facility]. -Continue remdesivir, steroids and current IV antibiotic -Wean oxygen supplementation as tolerated -Continue antitussive/mucolytic medication -Continue bronchodilator management -Continue IV fluids and supportive care. -Patient has been started on fentanyl and clonidine patches to assist with heroin withdrawal.  Barton Dubois MD [phone]

## 2020-10-17 NOTE — H&P (Signed)
History and Physical    Heidi [name] EPP:[id number] DOB: December 09, 1986 DOA: 10/16/2020  PCP: Patient, No Pcp Per   Patient coming from: Home.  I have personally briefly reviewed patient's old medical records in [facility]  Chief Complaint: Withdrawing from heroin.  HPI: Heidi [name] is a 34 y.o. female with medical history significant of abnormal Pap smear, anxiety, chronic shoulder pain, history of drug use, history of hepatitis C who last used heroin about 3 days ago, currently withdrawing with hyperventilation and lethargy.  She is arousable and answers simple questions, but becomes somnolent again.  ED Course: Initial vital signs temperature 101.9 F, pulse 91, respirations 50, BP 137/83 mmHg and O2 sat 96% on [place] at 2 LPM.  CBC showed a white count was 17.8, hemoglobin 13.1 g/dL and platelets 606.  ABG showed a pH of 7.509, PCO2 of 28.3 and PO2 of 68.0 mmHg on 32% of oxygen.  Salicylate, acetaminophen and lactic acid were normal.  Sodium is 137, potassium 2.5, Chloride 110 and CO2 20 mmol/L.  Glucose 121 mg/dL.  Renal function was normal.  Her chest radiograph showed right bibasilar pneumonic infiltrate.  Review of Systems: As per HPI otherwise all other systems reviewed and are negative.  Past Medical History:  Diagnosis Date  . Abnormal Pap smear of vagina   . Anxiety   . Chronic shoulder pain   . Drug use   . Hepatitis C   . Polysubstance abuse [facility])     Past Surgical History:  Procedure Laterality Date  . ANKLE SURGERY      Social History  reports that she has been smoking cigarettes. She has been smoking about 1.00 pack per day. She has never used smokeless tobacco. She reports current drug use. Drugs: Heroin, Marijuana, Methamphetamines, and IV. She reports that she does not drink alcohol.  Allergies  Allergen Reactions  . Penicillins Anaphylaxis, Hives and Other (See Comments)    Has patient had a PCN reaction causing immediate rash, facial/tongue/throat  swelling, SOB or lightheadedness with hypotension: Yes Has patient had a PCN reaction causing severe rash involving mucus membranes or skin necrosis: No Has patient had a PCN reaction that required hospitalization No Has patient had a PCN reaction occurring within the last 10 years: No If all of the above answers are "NO", then may proceed with Cephalosporin use.  headaches  . Tylenol [Acetaminophen] Hives  . Venlafaxine Hives and Other (See Comments)    Headaches    . Morphine And Related Hives and Other (See Comments)    Reaction: headaches    Family History  Problem Relation Age of Onset  . Cirrhosis Father   . Hepatitis C Father    Prior to Admission medications   Medication Sig Start Date End Date Taking? Authorizing Provider  clindamycin (CLEOCIN) 150 MG capsule Take 2 capsules (300 mg total) by mouth 4 (four) times daily. 11/13/19   Triplett, Tammy, PA-C  levonorgestrel (MIRENA) 20 MCG/24HR IUD 1 each by Intrauterine route once.    [provider]    Physical Exam: Vitals:   10/17/20 0300 10/17/20 0330 10/17/20 0400 10/17/20 0430  BP: (!) 144/80 (!) 142/78 (!) 141/83 (!) 142/89  Pulse: 83 89 95 87  Resp: (!) 46 (!) 50 (!) 49 (!) 46  Temp:      TempSrc:      SpO2: 95% 96% 99% 99%    Constitutional: Febrile.  Looks acutely ill. Eyes: PERRL, lids and conjunctivae normal ENMT: Mucous membranes are dry.  Posterior pharynx clear of any exudate or lesions. Neck: normal, supple, no masses, no thyromegaly Respiratory: Hyperventilating with respiratory frequency in the 40s, no wheezing no rhonchi. Cardiovascular: Regular rate and rhythm, no murmurs / rubs / gallops. No extremity edema. 2+ pedal pulses. No carotid bruits.  Abdomen: Nondistended.  BS positive.  Soft, no tenderness, no masses palpated. No hepatosplenomegaly. Musculoskeletal: no clubbing / cyanosis.  Good ROM, no contractures. Normal muscle tone.  Skin: Warm and dry. Neurologic: Obtunded.  Grossly  nonfocal. Psychiatric: Obtunded.  Responds to verbal and tactile stimuli.  Labs on Admission: I have personally reviewed following labs and imaging studies  CBC: Recent Labs  Lab 10/16/20 2348  WBC 17.8*  NEUTROABS 15.7*  HGB 13.1  HCT 42.1  MCV 82.4  PLT 277    Basic Metabolic Panel: Recent Labs  Lab 10/16/20 2348  NA 137  K 2.5*  CL 110  CO2 20*  GLUCOSE 121*  BUN 17  CREATININE 0.55  CALCIUM 8.5*    GFR: CrCl cannot be calculated (Unknown ideal weight.).  Liver Function Tests: No results for input(s): AST, ALT, ALKPHOS, BILITOT, PROT, ALBUMIN in the last 168 hours.  Radiological Exams on Admission: DG Chest Portable 1 View  Result Date: 10/17/2020 CLINICAL DATA:  Hypoxia, rales EXAM: PORTABLE CHEST 1 VIEW COMPARISON:  04/15/2016 FINDINGS: There has developed focal airspace infiltrate within the right lung base, likely infectious in the acute setting. No pneumothorax or pleural effusion. Cardiac size within normal limits. No acute bone abnormality. IMPRESSION: Right basilar pneumonic infiltrate. Electronically Signed   By: Helyn Numbers MD   On: 10/17/2020 00:20   EKG: Independently reviewed.  Vent. rate 92 BPM PR interval * ms QRS duration 91 ms QT/QTc 380/471 ms P-R-T axes 102 82 108 Sinus rhythm Biatrial enlargement Nonspecific T abnrm, anterolateral leads  Assessment/Plan Principal Problem:   Acute aspiration pneumonia (HCC)   Sepsis due to pneumonia (HCC) Supplemental oxygen as needed. Bronchodilators as needed. Continue ceftriaxone and Zithromax. Metronidazole added to cover anaerobes. Follow-up blood culture and sensitivity.  Active Problems:   Pneumonia due to COVID-19 virus Continue treatment for aspiration pneumonia. Antitussives as needed. Antiemetics as needed. I-S and flutter valve when able to perform. Dexamethasone 6 mg IVP daily. Remdesivir per pharmacy. Monitor CBC, CMP and inflammatory markers.    Opiate withdrawal  (HCC) Low-dose fentanyl patch. Clonidine 0.2 mg 24-hour 7-day patch. Consult TOC and [facility]    Hypokalemia Due to hyperventilation. Replacing. Follow-up potassium level.    Altered mental state In the setting of: Opiate withdrawal. Aspiration and COVID-19 pneumonia. Continue current treatment. Consult behavioral health once more alert.   DVT prophylaxis: Lovenox SQ. Code Status:   Full code. Family Communication:   Disposition Plan:   Patient is from:  Home.  Anticipated DC to:  TBD.  Anticipated DC date:  10/19/2020.  Anticipated DC barriers: Clinical status.  Consults called: Admission status:  Inpatient/telemetry.   Severity of Illness: Very high in the setting of combined aspiration and COVID-19 pneumonia, altered mental status and opiate withdrawal.  Bobette Mo MD [facility]  How to contact the [facility] Attending or Consulting provider 7A - 7P or covering provider during after hours 7P -7A, for this patient?   1. Check the care team in [facility] and look for a) attending/consulting TRH provider listed and b) the [facility] team listed 2. Log into [email or web address] and use [place]'s universal password to access. If you do not have the password, please contact the hospital operator.  3. Locate the [facility] provider you are looking for under [facility] and page to a number that you can be directly reached. 4. If you still have difficulty reaching the provider, please page the [facility] (Director on Call) for the Hospitalists listed on amion for assistance.  10/17/2020, 5:21 AM   This document was prepared using Dragon voice recognition software and may contain some unintended transcription errors.

## 2020-10-17 NOTE — ED Notes (Signed)
Placed on HFNC for low PO2, High PH, patient on 7 liter hfnc for now

## 2020-10-18 DIAGNOSIS — F1994 Other psychoactive substance use, unspecified with psychoactive substance-induced mood disorder: Secondary | ICD-10-CM

## 2020-10-18 DIAGNOSIS — E876 Hypokalemia: Secondary | ICD-10-CM

## 2020-10-18 DIAGNOSIS — F1910 Other psychoactive substance abuse, uncomplicated: Secondary | ICD-10-CM

## 2020-10-18 LAB — COMPREHENSIVE METABOLIC PANEL
ALT: 50 U/L — ABNORMAL HIGH (ref 0–44)
AST: 18 U/L (ref 15–41)
Albumin: 3.2 g/dL — ABNORMAL LOW (ref 3.5–5.0)
Alkaline Phosphatase: 54 U/L (ref 38–126)
Anion gap: 9 (ref 5–15)
BUN: 19 mg/dL (ref 6–20)
CO2: 21 mmol/L — ABNORMAL LOW (ref 22–32)
Calcium: 8.4 mg/dL — ABNORMAL LOW (ref 8.9–10.3)
Chloride: 109 mmol/L (ref 98–111)
Creatinine, Ser: 0.55 mg/dL (ref 0.44–1.00)
GFR, Estimated: >60 mL/min (ref >60)
Glucose, Bld: 107 mg/dL — ABNORMAL HIGH (ref 70–99)
Potassium: 3 mmol/L — ABNORMAL LOW (ref 3.5–5.1)
Sodium: 139 mmol/L (ref 135–145)
Total Bilirubin: 0.8 mg/dL (ref 0.3–1.2)
Total Protein: 6.7 g/dL (ref 6.5–8.1)

## 2020-10-18 LAB — CBC WITH DIFFERENTIAL/PLATELET
Abs Immature Granulocytes: 0.04 10*3/uL (ref 0.00–0.07)
Basophils Absolute: 0 10*3/uL (ref 0.0–0.1)
Basophils Relative: 0 %
Eosinophils Absolute: 0 10*3/uL (ref 0.0–0.5)
Eosinophils Relative: 0 %
HCT: 37.8 % (ref 36.0–46.0)
Hemoglobin: 12.1 g/dL (ref 12.0–15.0)
Immature Granulocytes: 0 %
Lymphocytes Relative: 7 %
Lymphs Abs: 0.8 10*3/uL (ref 0.7–4.0)
MCH: 25.5 pg — ABNORMAL LOW (ref 26.0–34.0)
MCHC: 32 g/dL (ref 30.0–36.0)
MCV: 79.7 fL — ABNORMAL LOW (ref 80.0–100.0)
Monocytes Absolute: 0.2 10*3/uL (ref 0.1–1.0)
Monocytes Relative: 2 %
Neutro Abs: 9.1 10*3/uL — ABNORMAL HIGH (ref 1.7–7.7)
Neutrophils Relative %: 91 %
Platelets: 210 10*3/uL (ref 150–400)
RBC: 4.74 MIL/uL (ref 3.87–5.11)
RDW: 13.9 % (ref 11.5–15.5)
WBC: 10.1 10*3/uL (ref 4.0–10.5)
nRBC: 0 % (ref 0.0–0.2)

## 2020-10-18 LAB — C-REACTIVE PROTEIN: CRP: 10.8 mg/dL — ABNORMAL HIGH (ref <1.0)

## 2020-10-18 LAB — MAGNESIUM: Magnesium: 2.2 mg/dL (ref 1.7–2.4)

## 2020-10-18 LAB — D-DIMER, QUANTITATIVE: D-Dimer, Quant: 2 ug/mL-FEU — ABNORMAL HIGH (ref 0.00–0.50)

## 2020-10-18 LAB — PHOSPHORUS: Phosphorus: 2.5 mg/dL (ref 2.5–4.6)

## 2020-10-18 LAB — FERRITIN: Ferritin: 192 ng/mL (ref 11–307)

## 2020-10-18 MED ORDER — POLYVINYL ALCOHOL 1.4 % OP SOLN
1.0000 [drp] | OPHTHALMIC | Status: DC | PRN
  Administered 2020-10-18: 1 [drp] via OPHTHALMIC
  Filled 2020-10-18: qty 15

## 2020-10-18 MED ORDER — POTASSIUM CHLORIDE CRYS ER 20 MEQ PO TBCR
40.0000 meq | EXTENDED_RELEASE_TABLET | Freq: Once | ORAL | Status: AC
  Administered 2020-10-18: 40 meq via ORAL
  Filled 2020-10-18: qty 2

## 2020-10-18 NOTE — Progress Notes (Addendum)
PROGRESS NOTE  Heidi [name] HQI:[id number] DOB: October 05, 1986 DOA: 10/16/2020 PCP: Patient, No Pcp Per   LOS: 1 day   Brief narrative: As per HPI,  Heidi [name] is a 34 y.o. female with medical history significant for anxiety, chronic shoulder pain, history of drug use, history of hepatitis C who last used heroin about 3 days prior to presentation presented to hospital with hyperventilation pathology but able was able to answer few questions.  Initial vital signs temperature 101.9 F, pulse 91, respirations 50, BP 137/83 mmHg and O2 sat 96% on [place] at 2 LPM.CBC showed a white count was 17.8, hemoglobin 13.1 g/dL and platelets 132.  ABG showed a pH of 7.509, PCO2 of 28.3 and PO2 of 68.0 mmHg on 32% of oxygen.  Salicylate, acetaminophen and lactic acid were normal.  Sodium was 137, potassium 2.5, Chloride 110 and CO2 20 mmol/L.  Glucose 121 mg/dL.  Renal function was normal.  Her chest radiograph showed right bibasilar pneumonic infiltrate.  Patient tested positive for Covid and was admitted hospital for Covid pneumonia with possible  aspiration and opiate withdrawal.  Assessment/Plan:  Principal Problem:   Pneumonia due to COVID-19 virus Active Problems:   Chronic pain syndrome   Hypokalemia   Polysubstance abuse (HCC)   Substance induced mood disorder (HCC)   Altered mental state   Opiate withdrawal (HCC)   Sepsis due to pneumonia (HCC)  Severe sepsis secondary to  Covid pneumonia. Patient presented with features of sepsis including tachypnea, fever acute hypoxia requiring high flow nasal cannula, leukocytosis on presentation with metabolic encephalopathy..  Received rocephin zithromax and metro but chest x-ray with the interstitial infiltrates. No overt pneumonia. Procalcitonin less than 0.1. Will discontinue antibiotic for now and observe. Continue dexamethasone, remdesivir, supportive care, supplemental oxygen. Currently on 5 L of oxygen by nasal cannula.  Continue to wean oxygen as  able.  COVID-19 Labs  Recent Labs    10/16/20 2348 10/18/20 0501  DDIMER 0.66* 2.00*  FERRITIN 106 192  LDH 120  --   CRP 2.6* 10.8*    Lab Results  Component Value Date   SARSCOV2NAA POSITIVE (A) 10/16/2020   SARSCOV2NAA NEGATIVE 08/01/2020      Mild opiate withdrawal   History of heroin abuse.  On fentanyl patch.  Continue clonidine.  Transition of care has been consulted.    Hypokalemia Still low.  We will continue to replenish. Check levels in a.m. Check magnesium levels in a.m. as well.  Altered mental status likely metabolic encephalopathy. Likely multifactorial secondary to opiate withdrawal, aspiration and Covid pneumonia. Has improved at this time.  Polysubstance abuse, heroin abuse.  Counseling done.  On fentanyl patch at this time.  Complains of generalized body ache.  No nausea vomiting abdominal cramps or overt signs of withdrawal at this time.  DVT prophylaxis: enoxaparin (LOVENOX) injection 40 mg Start: 10/17/20 0800   Code Status: Full code  Family Communication: I tried to call the patient's mother [name] on the phone but was unable to reach her.  Status is: Inpatient  Remains inpatient appropriate because:IV treatments appropriate due to intensity of illness or inability to take PO, Inpatient level of care appropriate due to severity of illness and Covid pneumonia on oxygen   Dispo: The patient is from: Home              Anticipated d/c is to: Home              Anticipated d/c date is: 3 days  Patient currently is not medically stable to d/c.  Consultants: None   procedures:  None  Antibiotics:  . Remdesivir 11/2> . Rocephin and Zithromax and metronidazole 11/2>11/3  Anti-infectives (From admission, onward)   Start     Dose/Rate Route Frequency Ordered Stop   10/18/20 1000  remdesivir 100 mg in sodium chloride 0.9 % 100 mL IVPB        100 mg 200 mL/hr over 30 Minutes Intravenous Daily 10/17/20 0126 10/22/20 0959    10/18/20 0115  cefTRIAXone (ROCEPHIN) 1 g in sodium chloride 0.9 % 100 mL IVPB        1 g 200 mL/hr over 30 Minutes Intravenous  Once 10/17/20 0520 10/18/20 0219   10/17/20 0915  metroNIDAZOLE (FLAGYL) IVPB 500 mg        500 mg 100 mL/hr over 60 Minutes Intravenous Every 8 hours 10/17/20 0215     10/17/20 0130  remdesivir 100 mg in sodium chloride 0.9 % 100 mL IVPB        100 mg 200 mL/hr over 30 Minutes Intravenous Every 1 hr x 2 10/17/20 0126 10/17/20 0643   10/17/20 0115  cefTRIAXone (ROCEPHIN) 1 g in sodium chloride 0.9 % 100 mL IVPB        1 g 200 mL/hr over 30 Minutes Intravenous  Once 10/17/20 0101 10/17/20 0304   10/17/20 0115  azithromycin (ZITHROMAX) 500 mg in sodium chloride 0.9 % 250 mL IVPB        500 mg 250 mL/hr over 60 Minutes Intravenous Every 24 hours 10/17/20 0101     10/17/20 0115  metroNIDAZOLE (FLAGYL) IVPB 500 mg        500 mg 100 mL/hr over 60 Minutes Intravenous  Once 10/17/20 0101 10/17/20 0425       Subjective: Today, patient was seen and examined at bedside.  Complains of generalized body pain but denies any nausea vomiting abdominal pain or increasing shortness of breath.  Objective: Vitals:   10/18/20 0314 10/18/20 0634  BP: 125/87 135/80  Pulse: 81 69  Resp: (!) 26   Temp: 99.6 F (37.6 C)   SpO2: 99% 100%    Intake/Output Summary (Last 24 hours) at 10/18/2020 0829 Last data filed at 10/18/2020 0600 Gross per 24 hour  Intake 1388 ml  Output 402 ml  Net 986 ml   Filed Weights   10/17/20 1100  Weight: 60.7 kg   Body mass index is 20.96 kg/m.   Physical Exam: GENERAL: Patient is alert awake and communicative.  Mildly tachypneic.  Not in obvious distress.  On nasal cannula oxygen.  Thinly built HENT: No scleral pallor or icterus. Pupils equally reactive to light. Oral mucosa is dry, poor dentition NECK: is supple, no gross swelling noted. CHEST: Coarse breath sounds noted, CVS: S1 and S2 heard, no murmur. Regular rate and rhythm.   ABDOMEN: Soft, non-tender, bowel sounds are present. EXTREMITIES: No edema. CNS: Cranial nerves are intact. No focal motor deficits. SKIN: warm and dry without rashes.  Data Review: I have personally reviewed the following laboratory data and studies,  CBC: Recent Labs  Lab 10/16/20 2348 10/18/20 0501  WBC 17.8* 10.1  NEUTROABS 15.7* 9.1*  HGB 13.1 12.1  HCT 42.1 37.8  MCV 82.4 79.7*  PLT 277 210   Basic Metabolic Panel: Recent Labs  Lab 10/16/20 2348 10/18/20 0501  NA 137 139  K 2.5* 3.0*  CL 110 109  CO2 20* 21*  GLUCOSE 121* 107*  BUN 17 19  CREATININE  0.55 0.55  CALCIUM 8.5* 8.4*  MG  --  2.2  PHOS  --  2.5   Liver Function Tests: Recent Labs  Lab 10/18/20 0501  AST 18  ALT 50*  ALKPHOS 54  BILITOT 0.8  PROT 6.7  ALBUMIN 3.2*   No results for input(s): LIPASE, AMYLASE in the last 168 hours. No results for input(s): AMMONIA in the last 168 hours. Cardiac Enzymes: No results for input(s): CKTOTAL, CKMB, CKMBINDEX, TROPONINI in the last 168 hours. BNP (last 3 results) No results for input(s): BNP in the last 8760 hours.  ProBNP (last 3 results) No results for input(s): PROBNP in the last 8760 hours.  CBG: Recent Labs  Lab 10/17/20 1012  GLUCAP 102*   Recent Results (from the past 240 hour(s))  Respiratory Panel by RT PCR (Flu A&B, Covid) - Nasopharyngeal Swab     Status: Abnormal   Collection Time: 10/16/20 11:43 PM   Specimen: Nasopharyngeal Swab  Result Value Ref Range Status   SARS Coronavirus 2 by RT PCR POSITIVE (A) NEGATIVE Final    Comment: RESULT CALLED TO, READ BACK BY AND VERIFIED WITH: K WATLUNGTON,RN@0117  10/17/20 MKELLY (NOTE) SARS-CoV-2 target nucleic acids are DETECTED.  SARS-CoV-2 RNA is generally detectable in upper respiratory specimens  during the acute phase of infection. Positive results are indicative of the presence of the identified virus, but do not rule out bacterial infection or co-infection with other pathogens  not detected by the test. Clinical correlation with patient history and other diagnostic information is necessary to determine patient infection status. The expected result is Negative.  Fact Sheet for Patients:  https://www.moore.com/https://www.fda.gov/media/142436/download  Fact Sheet for Healthcare Providers: https://www.young.biz/https://www.fda.gov/media/142435/download  This test is not yet approved or cleared by the [place] FDA and  has been authorized for detection and/or diagnosis of SARS-CoV-2 by FDA under an Emergency Use Authorization (EUA).  This EUA will remain in effect (meaning this test can b e used) for the duration of  the COVID-19 declaration under Section 564(b)(1) of the Act, 21 U.S.C. section 360bbb-3(b)(1), unless the authorization is terminated or revoked sooner.      Influenza A by PCR NEGATIVE NEGATIVE Final   Influenza B by PCR NEGATIVE NEGATIVE Final    Comment: (NOTE) The Xpert Xpress SARS-CoV-2/FLU/RSV assay is intended as an aid in  the diagnosis of influenza from Nasopharyngeal swab specimens and  should not be used as a sole basis for treatment. Nasal washings and  aspirates are unacceptable for Xpert Xpress SARS-CoV-2/FLU/RSV  testing.  Fact Sheet for Patients: https://www.moore.com/https://www.fda.gov/media/142436/download  Fact Sheet for Healthcare Providers: https://www.young.biz/https://www.fda.gov/media/142435/download  This test is not yet approved or cleared by the [place] FDA and  has been authorized for detection and/or diagnosis of SARS-CoV-2 by  FDA under an Emergency Use Authorization (EUA). This EUA will remain  in effect (meaning this test can be used) for the duration of the  Covid-19 declaration under Section 564(b)(1) of the Act, 21  U.S.C. section 360bbb-3(b)(1), unless the authorization is  terminated or revoked. Performed at [facility], [address] [place], [place] [address]   Blood culture (routine x 2)     Status: None (Preliminary result)   Collection Time: 10/16/20 11:48 PM    Specimen: BLOOD  Result Value Ref Range Status   Specimen Description BLOOD RIGHT ANTECUBITAL  Final   Special Requests   Final    BOTTLES DRAWN AEROBIC AND ANAEROBIC Blood Culture adequate volume   Culture   Final    NO  GROWTH < 24 HOURS Performed at [facility], [address] [place], [place] [address]    Report Status PENDING  Incomplete  Blood culture (routine x 2)     Status: None (Preliminary result)   Collection Time: 10/17/20 12:11 AM   Specimen: BLOOD LEFT HAND  Result Value Ref Range Status   Specimen Description BLOOD LEFT HAND  Final   Special Requests   Final    BOTTLES DRAWN AEROBIC AND ANAEROBIC Blood Culture adequate volume   Culture   Final    NO GROWTH < 24 HOURS Performed at [facility], [address] [place], [place] [address]    Report Status PENDING  Incomplete     Studies: DG Chest Portable 1 View  Result Date: 10/17/2020 CLINICAL DATA:  Hypoxia, rales EXAM: PORTABLE CHEST 1 VIEW COMPARISON:  04/15/2016 FINDINGS: There has developed focal airspace infiltrate within the right lung base, likely infectious in the acute setting. No pneumothorax or pleural effusion. Cardiac size within normal limits. No acute bone abnormality. IMPRESSION: Right basilar pneumonic infiltrate. Electronically Signed   By: Helyn Numbers MD   On: 10/17/2020 00:20      Joycelyn Das, MD  [facility] 10/18/2020

## 2020-10-19 LAB — CBC WITH DIFFERENTIAL/PLATELET
Abs Immature Granulocytes: 0.05 10*3/uL (ref 0.00–0.07)
Basophils Absolute: 0 10*3/uL (ref 0.0–0.1)
Basophils Relative: 0 %
Eosinophils Absolute: 0 10*3/uL (ref 0.0–0.5)
Eosinophils Relative: 0 %
HCT: 39.1 % (ref 36.0–46.0)
Hemoglobin: 12.6 g/dL (ref 12.0–15.0)
Immature Granulocytes: 1 %
Lymphocytes Relative: 12 %
Lymphs Abs: 1 10*3/uL (ref 0.7–4.0)
MCH: 25.6 pg — ABNORMAL LOW (ref 26.0–34.0)
MCHC: 32.2 g/dL (ref 30.0–36.0)
MCV: 79.3 fL — ABNORMAL LOW (ref 80.0–100.0)
Monocytes Absolute: 0.3 10*3/uL (ref 0.1–1.0)
Monocytes Relative: 3 %
Neutro Abs: 7 10*3/uL (ref 1.7–7.7)
Neutrophils Relative %: 84 %
Platelets: 242 10*3/uL (ref 150–400)
RBC: 4.93 MIL/uL (ref 3.87–5.11)
RDW: 13.7 % (ref 11.5–15.5)
WBC: 8.3 10*3/uL (ref 4.0–10.5)
nRBC: 0 % (ref 0.0–0.2)

## 2020-10-19 LAB — FERRITIN: Ferritin: 196 ng/mL (ref 11–307)

## 2020-10-19 LAB — COMPREHENSIVE METABOLIC PANEL
ALT: 34 U/L (ref 0–44)
AST: 13 U/L — ABNORMAL LOW (ref 15–41)
Albumin: 3.3 g/dL — ABNORMAL LOW (ref 3.5–5.0)
Alkaline Phosphatase: 53 U/L (ref 38–126)
Anion gap: 12 (ref 5–15)
BUN: 23 mg/dL — ABNORMAL HIGH (ref 6–20)
CO2: 19 mmol/L — ABNORMAL LOW (ref 22–32)
Calcium: 8.4 mg/dL — ABNORMAL LOW (ref 8.9–10.3)
Chloride: 106 mmol/L (ref 98–111)
Creatinine, Ser: 0.6 mg/dL (ref 0.44–1.00)
GFR, Estimated: >60 mL/min (ref >60)
Glucose, Bld: 142 mg/dL — ABNORMAL HIGH (ref 70–99)
Potassium: 2.9 mmol/L — ABNORMAL LOW (ref 3.5–5.1)
Sodium: 137 mmol/L (ref 135–145)
Total Bilirubin: 0.6 mg/dL (ref 0.3–1.2)
Total Protein: 7.2 g/dL (ref 6.5–8.1)

## 2020-10-19 LAB — D-DIMER, QUANTITATIVE: D-Dimer, Quant: 0.58 ug/mL-FEU — ABNORMAL HIGH (ref 0.00–0.50)

## 2020-10-19 LAB — PHOSPHORUS: Phosphorus: 2.1 mg/dL — ABNORMAL LOW (ref 2.5–4.6)

## 2020-10-19 LAB — MAGNESIUM: Magnesium: 2.2 mg/dL (ref 1.7–2.4)

## 2020-10-19 LAB — C-REACTIVE PROTEIN: CRP: 4.7 mg/dL — ABNORMAL HIGH (ref <1.0)

## 2020-10-19 MED ORDER — POTASSIUM CHLORIDE CRYS ER 20 MEQ PO TBCR
40.0000 meq | EXTENDED_RELEASE_TABLET | Freq: Two times a day (BID) | ORAL | Status: DC
  Administered 2020-10-19 – 2020-10-20 (×4): 40 meq via ORAL
  Filled 2020-10-19 (×3): qty 2
  Filled 2020-10-19: qty 4
  Filled 2020-10-19: qty 2

## 2020-10-19 MED ORDER — POTASSIUM CHLORIDE 10 MEQ/100ML IV SOLN
10.0000 meq | INTRAVENOUS | Status: AC
  Administered 2020-10-19 (×4): 10 meq via INTRAVENOUS
  Filled 2020-10-19 (×5): qty 100

## 2020-10-19 MED ORDER — ALBUTEROL SULFATE HFA 108 (90 BASE) MCG/ACT IN AERS
2.0000 | INHALATION_SPRAY | Freq: Two times a day (BID) | RESPIRATORY_TRACT | Status: DC
  Administered 2020-10-20 – 2020-10-21 (×3): 2 via RESPIRATORY_TRACT

## 2020-10-19 MED ORDER — K PHOS MONO-SOD PHOS DI & MONO 155-852-130 MG PO TABS
500.0000 mg | ORAL_TABLET | Freq: Three times a day (TID) | ORAL | Status: DC
  Administered 2020-10-19 – 2020-10-20 (×5): 500 mg via ORAL
  Filled 2020-10-19 (×7): qty 2

## 2020-10-19 NOTE — TOC Progression Note (Signed)
Transition of Care [facility]) - Progression Note    Patient Details  Name: Heidi [name] MRN: [id number] Date of Birth: 11-09-1986  Transition of Care [facility] CM/SW Contact  Geni Bers, RN Phone Number: 10/19/2020, 1:16 PM  Clinical Narrative:    Pt from home with parents. TOC will continue to follow.    Expected Discharge Plan: Home/Self Care Barriers to Discharge: No Barriers Identified  Expected Discharge Plan and Services Expected Discharge Plan: Home/Self Care       Living arrangements for the past 2 months: Single Family Home                                       Social Determinants of Health (SDOH) Interventions    Readmission Risk Interventions No flowsheet data found.

## 2020-10-19 NOTE — Progress Notes (Signed)
PROGRESS NOTE  Heidi [name] CBU:[id number] DOB: 1986/07/30 DOA: 10/16/2020 PCP: Patient, No Pcp Per   LOS: 2 days   Brief narrative:  Heidi [name] is a 34 y.o. female with medical history significant for anxiety, chronic shoulder pain, history of drug use, history of hepatitis C who last used heroin about 3 days prior to presentation, presented to hospital with hyperventilation and lethargy but able was able to answer few questions on initial presentation.  In the ED, patient was febrile with a temperature of 101.9 F with pulse ox of 96% on 2 L of nasal cannula.  She did have mild leukocytosis on presentation at 17.8.  ABG showed respiratory alkalosis. Salicylate, acetaminophen and lactic acid were normal. Chest radiograph showed right bibasilar pneumonic infiltrate.  Patient tested positive for Covid and was admitted hospital for Covid pneumonia  and mild opiate withdrawal.  Assessment/Plan:  Principal Problem:   Pneumonia due to COVID-19 virus Active Problems:   Chronic pain syndrome   Hypokalemia   Polysubstance abuse (HCC)   Substance induced mood disorder (HCC)   Altered mental state   Opiate withdrawal (HCC)   Sepsis due to pneumonia (HCC)  Severe sepsis secondary to  Covid pneumonia. Patient presented with features of sepsis including tachypnea, fever, acute hypoxia requiring high flow nasal cannula, leukocytosis on presentation with metabolic encephalopathy.. Procalcitonin less than 0.1. Off antibiotics.  Will continue dexamethasone, remdesivir, supportive care, as needed supplemental oxygen. We will continue to wean oxygen as able. Trend inflammatory markers.  COVID-19 Labs  Recent Labs    10/16/20 2348 10/18/20 0501 10/19/20 0424  DDIMER 0.66* 2.00* 0.58*  FERRITIN 106 192 196  LDH 120  --   --   CRP 2.6* 10.8* 4.7*    Lab Results  Component Value Date   SARSCOV2NAA POSITIVE (A) 10/16/2020   SARSCOV2NAA NEGATIVE 08/01/2020      Mild opiate withdrawal   History  of heroin abuse.  On fentanyl patch.  Continue clonidine.  Transition of care has been consulted.  No overt withdrawal symptoms at this time.    Hypokalemia Significantly low.  Potassium 2.9 today.  Will replenish IV and orally. Check BMP in a.m.  Altered mental status likely metabolic encephalopathy. Improved at this time.  Likely multifactorial secondary to opiate withdrawal, aspiration and Covid pneumonia.   Polysubstance abuse, heroin abuse.  Complains of generalized body pain.  On fentanyl patch at this time.  No overt signs of withdrawal.  Hypophosphatemia.  Will replenish.  DVT prophylaxis: enoxaparin (LOVENOX) injection 40 mg Start: 10/17/20 0800  Code Status: Full code  Family Communication:  I spoke with patient's mother [name] on the phone and updated her about the clinical condition of the patient.  Patient currently lives with her mother at home.  Status is: Inpatient  Remains inpatient appropriate because:IV treatments appropriate due to intensity of illness or inability to take PO, Inpatient level of care appropriate due to severity of illness and Covid pneumonia on oxygen Not improving  Dispo: The patient is from: Home              Anticipated d/c is to: Home              Anticipated d/c date is: 1 to 2 days              Patient currently is not medically stable to d/c.  Consultants: None   procedures:  None  Antibiotics:   Remdesivir 11/2>  Subjective:  Today, patient was seen  and examined at bedside. Complains of generalized body ache. Has mild cough, no sputum production.  Objective: Vitals:   10/19/20 0250 10/19/20 0300  BP: 111/62 128/83  Pulse: (!) 56 (!) 54  Resp: 20 19  Temp:    SpO2: 97% 96%    Intake/Output Summary (Last 24 hours) at 10/19/2020 0848 Last data filed at 10/19/2020 0534 Gross per 24 hour  Intake 100 ml  Output --  Net 100 ml   Filed Weights   10/17/20 1100  Weight: 60.7 kg   Body mass index is 20.96 kg/m.    Physical Exam:  GENERAL: Patient is alert awake and communicative.  Not in obvious distress.  On nasal cannula oxygen.  Thinly built HENT: No scleral pallor or icterus. Pupils equally reactive to light. Oral mucosa is dry, poor dentition NECK: is supple, no gross swelling noted. CHEST: Coarse breath sounds noted bilaterally. CVS: S1 and S2 heard, no murmur. Regular rate and rhythm.  ABDOMEN: Soft, non-tender, bowel sounds are present. EXTREMITIES: No edema. CNS: Cranial nerves are intact. No focal motor deficits. SKIN: warm and dry without rashes.  Data Review: I have personally reviewed the following laboratory data and studies,  CBC: Recent Labs  Lab 10/16/20 2348 10/18/20 0501 10/19/20 0424  WBC 17.8* 10.1 8.3  NEUTROABS 15.7* 9.1* 7.0  HGB 13.1 12.1 12.6  HCT 42.1 37.8 39.1  MCV 82.4 79.7* 79.3*  PLT 277 210 242   Basic Metabolic Panel: Recent Labs  Lab 10/16/20 2348 10/18/20 0501 10/19/20 0424  NA 137 139 137  K 2.5* 3.0* 2.9*  CL 110 109 106  CO2 20* 21* 19*  GLUCOSE 121* 107* 142*  BUN 17 19 23*  CREATININE 0.55 0.55 0.60  CALCIUM 8.5* 8.4* 8.4*  MG  --  2.2 2.2  PHOS  --  2.5 2.1*   Liver Function Tests: Recent Labs  Lab 10/18/20 0501 10/19/20 0424  AST 18 13*  ALT 50* 34  ALKPHOS 54 53  BILITOT 0.8 0.6  PROT 6.7 7.2  ALBUMIN 3.2* 3.3*   No results for input(s): LIPASE, AMYLASE in the last 168 hours. No results for input(s): AMMONIA in the last 168 hours. Cardiac Enzymes: No results for input(s): CKTOTAL, CKMB, CKMBINDEX, TROPONINI in the last 168 hours. BNP (last 3 results) No results for input(s): BNP in the last 8760 hours.  ProBNP (last 3 results) No results for input(s): PROBNP in the last 8760 hours.  CBG: Recent Labs  Lab 10/17/20 1012  GLUCAP 102*   Recent Results (from the past 240 hour(s))  Respiratory Panel by RT PCR (Flu A&B, Covid) - Nasopharyngeal Swab     Status: Abnormal   Collection Time: 10/16/20 11:43 PM    Specimen: Nasopharyngeal Swab  Result Value Ref Range Status   SARS Coronavirus 2 by RT PCR POSITIVE (A) NEGATIVE Final    Comment: RESULT CALLED TO, READ BACK BY AND VERIFIED WITH: K WATLUNGTON,RN@0117  10/17/20 MKELLY (NOTE) SARS-CoV-2 target nucleic acids are DETECTED.  SARS-CoV-2 RNA is generally detectable in upper respiratory specimens  during the acute phase of infection. Positive results are indicative of the presence of the identified virus, but do not rule out bacterial infection or co-infection with other pathogens not detected by the test. Clinical correlation with patient history and other diagnostic information is necessary to determine patient infection status. The expected result is Negative.  Fact Sheet for Patients:  https://www.moore.com/  Fact Sheet for Healthcare Providers: https://www.young.biz/  This test is not yet approved or  cleared by the [place] and  has been authorized for detection and/or diagnosis of SARS-CoV-2 by FDA under an Emergency Use Authorization (EUA).  This EUA will remain in effect (meaning this test can b e used) for the duration of  the COVID-19 declaration under Section 564(b)(1) of the Act, 21 U.S.C. section 360bbb-3(b)(1), unless the authorization is terminated or revoked sooner.      Influenza A by PCR NEGATIVE NEGATIVE Final   Influenza B by PCR NEGATIVE NEGATIVE Final    Comment: (NOTE) The Xpert Xpress SARS-CoV-2/FLU/RSV assay is intended as an aid in  the diagnosis of influenza from Nasopharyngeal swab specimens and  should not be used as a sole basis for treatment. Nasal washings and  aspirates are unacceptable for Xpert Xpress SARS-CoV-2/FLU/RSV  testing.  Fact Sheet for Patients: https://www.moore.com/  Fact Sheet for Healthcare Providers: https://www.young.biz/  This test is not yet approved or cleared by the [place] FDA and   has been authorized for detection and/or diagnosis of SARS-CoV-2 by  FDA under an Emergency Use Authorization (EUA). This EUA will remain  in effect (meaning this test can be used) for the duration of the  Covid-19 declaration under Section 564(b)(1) of the Act, 21  U.S.C. section 360bbb-3(b)(1), unless the authorization is  terminated or revoked. Performed at [facility], [address], [place], [place] [address]   Blood culture (routine x 2)     Status: None (Preliminary result)   Collection Time: 10/16/20 11:48 PM   Specimen: BLOOD  Result Value Ref Range Status   Specimen Description BLOOD RIGHT ANTECUBITAL  Final   Special Requests   Final    BOTTLES DRAWN AEROBIC AND ANAEROBIC Blood Culture adequate volume   Culture   Final    NO GROWTH 2 DAYS Performed at [facility], [address], [place], [place] [address]    Report Status PENDING  Incomplete  Blood culture (routine x 2)     Status: None (Preliminary result)   Collection Time: 10/17/20 12:11 AM   Specimen: BLOOD LEFT HAND  Result Value Ref Range Status   Specimen Description BLOOD LEFT HAND  Final   Special Requests   Final    BOTTLES DRAWN AEROBIC AND ANAEROBIC Blood Culture adequate volume   Culture   Final    NO GROWTH 1 DAY Performed at [facility], [address], [place], [place] [address]    Report Status PENDING  Incomplete     Studies: No results found.    Joycelyn Das, MD  [facility] 10/19/2020

## 2020-10-19 NOTE — Progress Notes (Signed)
Patient arrived on the 5th floor via bed.

## 2020-10-20 DIAGNOSIS — R6520 Severe sepsis without septic shock: Secondary | ICD-10-CM

## 2020-10-20 LAB — COMPREHENSIVE METABOLIC PANEL
ALT: 26 U/L (ref 0–44)
AST: 11 U/L — ABNORMAL LOW (ref 15–41)
Albumin: 3.2 g/dL — ABNORMAL LOW (ref 3.5–5.0)
Alkaline Phosphatase: 49 U/L (ref 38–126)
Anion gap: 9 (ref 5–15)
BUN: 13 mg/dL (ref 6–20)
CO2: 18 mmol/L — ABNORMAL LOW (ref 22–32)
Calcium: 8.6 mg/dL — ABNORMAL LOW (ref 8.9–10.3)
Chloride: 109 mmol/L (ref 98–111)
Creatinine, Ser: 0.46 mg/dL (ref 0.44–1.00)
GFR, Estimated: >60 mL/min (ref >60)
Glucose, Bld: 123 mg/dL — ABNORMAL HIGH (ref 70–99)
Potassium: 3.9 mmol/L (ref 3.5–5.1)
Sodium: 136 mmol/L (ref 135–145)
Total Bilirubin: 0.5 mg/dL (ref 0.3–1.2)
Total Protein: 6.9 g/dL (ref 6.5–8.1)

## 2020-10-20 LAB — CBC WITH DIFFERENTIAL/PLATELET
Abs Immature Granulocytes: 0.05 10*3/uL (ref 0.00–0.07)
Basophils Absolute: 0 10*3/uL (ref 0.0–0.1)
Basophils Relative: 0 %
Eosinophils Absolute: 0 10*3/uL (ref 0.0–0.5)
Eosinophils Relative: 0 %
HCT: 39.5 % (ref 36.0–46.0)
Hemoglobin: 12.6 g/dL (ref 12.0–15.0)
Immature Granulocytes: 1 %
Lymphocytes Relative: 16 %
Lymphs Abs: 1.1 10*3/uL (ref 0.7–4.0)
MCH: 25.6 pg — ABNORMAL LOW (ref 26.0–34.0)
MCHC: 31.9 g/dL (ref 30.0–36.0)
MCV: 80.1 fL (ref 80.0–100.0)
Monocytes Absolute: 0.3 10*3/uL (ref 0.1–1.0)
Monocytes Relative: 4 %
Neutro Abs: 5.5 10*3/uL (ref 1.7–7.7)
Neutrophils Relative %: 79 %
Platelets: 316 10*3/uL (ref 150–400)
RBC: 4.93 MIL/uL (ref 3.87–5.11)
RDW: 13.6 % (ref 11.5–15.5)
WBC: 7 10*3/uL (ref 4.0–10.5)
nRBC: 0 % (ref 0.0–0.2)

## 2020-10-20 LAB — D-DIMER, QUANTITATIVE: D-Dimer, Quant: 0.61 ug/mL-FEU — ABNORMAL HIGH (ref 0.00–0.50)

## 2020-10-20 LAB — PHOSPHORUS: Phosphorus: 3.9 mg/dL (ref 2.5–4.6)

## 2020-10-20 LAB — C-REACTIVE PROTEIN: CRP: 1.3 mg/dL — ABNORMAL HIGH (ref <1.0)

## 2020-10-20 LAB — MAGNESIUM: Magnesium: 2.1 mg/dL (ref 1.7–2.4)

## 2020-10-20 LAB — FERRITIN: Ferritin: 147 ng/mL (ref 11–307)

## 2020-10-20 NOTE — TOC Progression Note (Signed)
Transition of Care [facility]) - Progression Note    Patient Details  Name: Heidi [name] MRN: [id number] Date of Birth: 04/04/1986  Transition of Care [facility]) CM/SW Contact  Ida Rogue, [place] Phone Number: 10/20/2020, 12:41 PM  Clinical Narrative:   Spoke with patient about plan post d/c related to heroin use.  She is currently linked with clinic in [place] that provides Subutex-apparently she lived there a couple of years ago and has never switched to different provider. She asked that I reach out to [phone] she missed her appointment due to admission to hospital.  I called and am awaiting a call back.  In meantime, she is open to some resources closer to home which I provided to her. TOC will continue to follow during the course of hospitalization.     Expected Discharge Plan: Home/Self Care Barriers to Discharge: No Barriers Identified  Expected Discharge Plan and Services Expected Discharge Plan: Home/Self Care       Living arrangements for the past 2 months: Single Family Home                                       Social Determinants of Health (SDOH) Interventions    Readmission Risk Interventions No flowsheet data found.

## 2020-10-20 NOTE — Evaluation (Signed)
Physical Therapy Evaluation Patient Details Name: Heidi [name] MRN: [id number] DOB: 19-Sep-1986 Today's Date: 10/20/2020   History of Present Illness  Heidi [name] is a 34 y.o. female with medical history significant for anxiety, chronic shoulder pain, history of drug use, history of hepatitis C admitted 10/16/20  with hyperventilation, fever,Chest radiograph showed right bibasilar pneumonic infiltrate.  Patient tested positive for Covid and was admitted hospital for Covid pneumonia  and mild opiate withdrawal.  Clinical Impression  The patient  Presents with mild balance deficits, appears has not ambulated except transfer to [facility]. Patient ambulated in room  With min then minguard assistance. Patient should progress to Dc home. Encouraged  Patient to ambulate to BR with assistance.  Patient did complain of dizziness, BP 115/92 post ambulation.  SPO2 on RA 95% Pt admitted with above diagnosis. Pt currently with functional limitations due to the deficits listed below (see PT Problem List). Pt will benefit from skilled PT to increase their independence and safety with mobility to allow discharge to the venue listed below.       Follow Up Recommendations No PT follow up;Supervision - Intermittent    Equipment Recommendations  None recommended by PT    Recommendations for Other Services   OT    Precautions / Restrictions Precautions Precautions: Fall      Mobility  Bed Mobility Overal bed mobility: Needs Assistance Bed Mobility: Supine to Sit     Supine to sit: Supervision          Transfers Overall transfer level: Needs assistance Equipment used: 1 person hand held assist Transfers: Sit to/from Stand Sit to Stand: Min assist         General transfer comment: steady assist from bed, use of rail from toilet  Ambulation/Gait Ambulation/Gait assistance: Min assist Gait Distance (Feet): 50 Feet (20' x 2) Assistive device: None;1 person hand held assist Gait  Pattern/deviations: Step-through pattern;Drifts right/left;Staggering right;Staggering left Gait velocity: decr   General Gait Details: initially gait very unsteady, requiring UE support. Patient improved with more ambulation.  Stairs            Wheelchair Mobility    Modified Rankin (Stroke Patients Only)       Balance Overall balance assessment: Mild deficits observed, not formally tested                                           Pertinent Vitals/Pain      Home Living Family/patient expects to be discharged to:: Private residence Living Arrangements: Parent Available Help at Discharge: Family Type of Home: House Home Access: Stairs to enter   Secretary/administrator of Steps: 3 Home Layout: One level Home Equipment: None      Prior Function Level of Independence: Independent               Hand Dominance   Dominant Hand: Right    Extremity/Trunk Assessment   Upper Extremity Assessment Upper Extremity Assessment: Overall WFL for tasks assessed    Lower Extremity Assessment Lower Extremity Assessment: Generalized weakness    Cervical / Trunk Assessment Cervical / Trunk Assessment: Normal  Communication   Communication: No difficulties  Cognition Arousal/Alertness: Awake/alert Behavior During Therapy: WFL for tasks assessed/performed;Flat affect Overall Cognitive Status: Within Functional Limits for tasks assessed  General Comments General comments (skin integrity, edema, etc.): stood at sink to [employer] and face.    Exercises     Assessment/Plan    PT Assessment Patient needs continued PT services  PT Problem List Decreased strength;Decreased activity tolerance;Decreased balance;Decreased mobility;Decreased knowledge of precautions       PT Treatment Interventions Gait training;Functional mobility training;Therapeutic activities;Therapeutic  exercise;Patient/family education    PT Goals (Current goals can be found in the Care Plan section)  Acute Rehab PT Goals Patient Stated Goal: to go home PT Goal Formulation: With patient Time For Goal Achievement: 11/03/20 Potential to Achieve Goals: Good    Frequency Min 3X/week   Barriers to discharge        Co-evaluation               AM-PAC PT "6 Clicks" Mobility  Outcome Measure Help needed turning from your back to your side while in a flat bed without using bedrails?: None Help needed moving from lying on your back to sitting on the side of a flat bed without using bedrails?: None Help needed moving to and from a bed to a chair (including a wheelchair)?: A Little Help needed standing up from a chair using your arms (e.g., wheelchair or bedside chair)?: A Little Help needed to walk in hospital room?: A Little Help needed climbing 3-5 steps with a railing? : A Little 6 Click Score: 20    End of Session   Activity Tolerance: Patient tolerated treatment well Patient left: in chair;with call bell/phone within reach;with chair alarm set Nurse Communication: Mobility status PT Visit Diagnosis: Unsteadiness on feet (R26.81);Difficulty in walking, not elsewhere classified (R26.2)    Time: 3536-1443 PT Time Calculation (min) (ACUTE ONLY): 40 min   Charges:   PT Evaluation $PT Eval Low Complexity: 1 Low PT Treatments $Gait Training: 8-22 mins $Self Care/Home Management: 8-22         Blanchard Kelch PT Acute Rehabilitation Services Pager [phone] Office [phone]   Rada Hay 10/20/2020, 4:12 PM

## 2020-10-20 NOTE — Progress Notes (Signed)
PROGRESS NOTE  Heidi [name] RUE:[id number] DOB: September 24, 1986   PCP: Patient, No Pcp Per  Patient is from: Home  DOA: 10/16/2020 LOS: 3  Chief complaints: Lethargy and hyperventilation  Brief Narrative / Interim history: 34 year old female with history of anxiety, chronic pain, IVDU (heroin) and hep C presenting with lethargy and hyperventilation, and admitted for COVID-19 pneumonia.  In ED, febrile to 101.9.  WBC 17.8.  ABG consistent with respiratory alkalosis.  Salicylate, Tylenol and lactic levels within normal.  CXR with right basilar infiltrate.  She tested positive for COVID-19 and admitted for COVID-19 pneumonia, hypokalemia and possible opiate withdrawal.  Started on remdesivir and Decadron.  Procalcitonin negative.  Subjective: Seen and examined earlier this morning.  No major events overnight of this morning.  Feels tired and weak.  Denies chest pain or dyspnea.  Reports some dry cough.  Denies GI or UTI symptoms.  Objective: Vitals:   10/19/20 2108 10/20/20 0034 10/20/20 0513 10/20/20 1413  BP: 115/78 128/83 126/85 128/86  Pulse: (!) 55 (!) 51 (!) 49 72  Resp: 18 18  20   Temp: 98.4 F (36.9 C) 97.6 F (36.4 C) 98.1 F (36.7 C) 98.4 F (36.9 C)  TempSrc: Oral Oral Oral Oral  SpO2: 95% 97% 97% 97%  Weight:      Height:        Intake/Output Summary (Last 24 hours) at 10/20/2020 1651 Last data filed at 10/20/2020 0929 Gross per 24 hour  Intake 490 ml  Output --  Net 490 ml   Filed Weights   10/17/20 1100  Weight: 60.7 kg    Examination:  GENERAL: No apparent distress.  Nontoxic. HEENT: MMM.  Vision and hearing grossly intact.  NECK: Supple.  No apparent JVD.  RESP: On room air.  No IWOB.  Fair aeration bilaterally. CVS:  RRR. Heart sounds normal.  ABD/GI/GU: BS+. Abd soft, NTND.  MSK/EXT:  Moves extremities. No apparent deformity. No edema.  SKIN: no apparent skin lesion or wound NEURO: Awake, alert and oriented appropriately.  No apparent focal neuro  deficit. PSYCH: Calm. Normal affect.  Procedures:  None  Microbiology summarized: COVID-19 PCR negative. Blood cultures NGTD x2.  Assessment & Plan: Severe sepsis secondary to COVID-19 pneumonia: POA.  Symptomatic for 3 days.  Heart tachycardia, fever, leukocytosis and encephalopathy on presentation.  CXR with right basilar infiltrate.  Procalcitonin negative. Recent Labs    10/18/20 0501 10/19/20 0424 10/20/20 0352 10/20/20 0614  DDIMER 2.00* 0.58* 0.61*  --   FERRITIN 192 196  --  147  CRP 10.8* 4.7*  --  1.3*  -Continue remdesivir and Decadron-day 4. -Subcu Lovenox for VTE prophylaxis -Ihalers, mucolytic/antitussive, vitamins, IS. OOB, PT/OT and proning as able while awake -Wean oxygen as able. -Monitor inflammatory markers.  Acute toxic metabolic encephalopathy-history of IVDU.  Also COVID-19 infection.  Resolved. -Treat treatable causes  IVDU with injectable heroin/possible opiate withdrawal: Reports following with Suboxone clinic -Counseled. -Continue fentanyl patch and clonidine. -TOC consulted  Hypokalemia/hypophosphatemia: Resolved.     Body mass index is 20.96 kg/m.         DVT prophylaxis:  enoxaparin (LOVENOX) injection 40 mg Start: 10/17/20 0800  Code Status: Full code Family Communication: Patient and/or RN.  Status is: Inpatient  Remains inpatient appropriate because:IV treatments appropriate due to intensity of illness or inability to take PO and Inpatient level of care appropriate due to severity of illness   Dispo: The patient is from: Home  Anticipated d/c is to: Home              Anticipated d/c date is: 1 day              Patient currently is not medically stable to d/c.       Consultants:  None   Sch Meds:  Scheduled Meds: . albuterol  2 puff Inhalation BID  . vitamin C  500 mg Oral Daily  . cloNIDine  0.2 mg Transdermal Weekly  . dexamethasone (DECADRON) injection  6 mg Intravenous Q24H  . enoxaparin  (LOVENOX) injection  40 mg Subcutaneous Q24H  . fentaNYL  1 patch Transdermal Q72H  . guaiFENesin  600 mg Oral BID  . phosphorus  500 mg Oral TID  . potassium chloride  40 mEq Oral BID  . zinc sulfate  220 mg Oral Daily   Continuous Infusions: . remdesivir 100 mg in NS 100 mL 100 mg (10/20/20 1050)   PRN Meds:.guaiFENesin-dextromethorphan, ibuprofen, ondansetron **OR** ondansetron (ZOFRAN) IV, polyvinyl alcohol  Antimicrobials: Anti-infectives (From admission, onward)   Start     Dose/Rate Route Frequency Ordered Stop   10/18/20 1000  remdesivir 100 mg in sodium chloride 0.9 % 100 mL IVPB        100 mg 200 mL/hr over 30 Minutes Intravenous Daily 10/17/20 0126 10/22/20 0959   10/18/20 0115  cefTRIAXone (ROCEPHIN) 1 g in sodium chloride 0.9 % 100 mL IVPB        1 g 200 mL/hr over 30 Minutes Intravenous  Once 10/17/20 0520 10/18/20 2203   10/17/20 0915  metroNIDAZOLE (FLAGYL) IVPB 500 mg  Status:  Discontinued        500 mg 100 mL/hr over 60 Minutes Intravenous Every 8 hours 10/17/20 0215 10/18/20 1100   10/17/20 0130  remdesivir 100 mg in sodium chloride 0.9 % 100 mL IVPB        100 mg 200 mL/hr over 30 Minutes Intravenous Every 1 hr x 2 10/17/20 0126 10/17/20 0643   10/17/20 0115  cefTRIAXone (ROCEPHIN) 1 g in sodium chloride 0.9 % 100 mL IVPB        1 g 200 mL/hr over 30 Minutes Intravenous  Once 10/17/20 0101 10/17/20 0304   10/17/20 0115  azithromycin (ZITHROMAX) 500 mg in sodium chloride 0.9 % 250 mL IVPB  Status:  Discontinued        500 mg 250 mL/hr over 60 Minutes Intravenous Every 24 hours 10/17/20 0101 10/18/20 1100   10/17/20 0115  metroNIDAZOLE (FLAGYL) IVPB 500 mg        500 mg 100 mL/hr over 60 Minutes Intravenous  Once 10/17/20 0101 10/17/20 0425       I have personally reviewed the following labs and images: CBC: Recent Labs  Lab 10/16/20 2348 10/18/20 0501 10/19/20 0424 10/20/20 0352  WBC 17.8* 10.1 8.3 7.0  NEUTROABS 15.7* 9.1* 7.0 5.5  HGB 13.1 12.1  12.6 12.6  HCT 42.1 37.8 39.1 39.5  MCV 82.4 79.7* 79.3* 80.1  PLT 277 210 242 316   BMP &GFR Recent Labs  Lab 10/16/20 2348 10/18/20 0501 10/19/20 0424 10/20/20 0352  NA 137 139 137 136  K 2.5* 3.0* 2.9* 3.9  CL 110 109 106 109  CO2 20* 21* 19* 18*  GLUCOSE 121* 107* 142* 123*  BUN 17 19 23* 13  CREATININE 0.55 0.55 0.60 0.46  CALCIUM 8.5* 8.4* 8.4* 8.6*  MG  --  2.2 2.2 2.1  PHOS  --  2.5 2.1*  3.9   Estimated Creatinine Clearance: 96.7 mL/min (by C-G formula based on SCr of 0.46 mg/dL). Liver & Pancreas: Recent Labs  Lab 10/18/20 0501 10/19/20 0424 10/20/20 0352  AST 18 13* 11*  ALT 50* 34 26  ALKPHOS 54 53 49  BILITOT 0.8 0.6 0.5  PROT 6.7 7.2 6.9  ALBUMIN 3.2* 3.3* 3.2*   No results for input(s): LIPASE, AMYLASE in the last 168 hours. No results for input(s): AMMONIA in the last 168 hours. Diabetic: No results for input(s): HGBA1C in the last 72 hours. Recent Labs  Lab 10/17/20 1012  GLUCAP 102*   Cardiac Enzymes: No results for input(s): CKTOTAL, CKMB, CKMBINDEX, TROPONINI in the last 168 hours. No results for input(s): PROBNP in the last 8760 hours. Coagulation Profile: No results for input(s): INR, PROTIME in the last 168 hours. Thyroid Function Tests: No results for input(s): TSH, T4TOTAL, FREET4, T3FREE, THYROIDAB in the last 72 hours. Lipid Profile: No results for input(s): CHOL, HDL, LDLCALC, TRIG, CHOLHDL, LDLDIRECT in the last 72 hours. Anemia Panel: Recent Labs    10/19/20 0424 10/20/20 0614  FERRITIN 196 147   Urine analysis:    Component Value Date/Time   COLORURINE YELLOW 08/05/2017 1801   APPEARANCEUR CLOUDY (A) 08/05/2017 1801   LABSPEC 1.017 08/05/2017 1801   PHURINE 5.0 08/05/2017 1801   GLUCOSEU NEGATIVE 08/05/2017 1801   HGBUR SMALL (A) 08/05/2017 1801   BILIRUBINUR NEGATIVE 08/05/2017 1801   KETONESUR NEGATIVE 08/05/2017 1801   PROTEINUR 30 (A) 08/05/2017 1801   UROBILINOGEN 0.2 07/26/2015 0156   NITRITE NEGATIVE  08/05/2017 1801   LEUKOCYTESUR LARGE (A) 08/05/2017 1801   Sepsis Labs: Invalid input(s): PROCALCITONIN, LACTICIDVEN  Microbiology: Recent Results (from the past 240 hour(s))  Respiratory Panel by RT PCR (Flu A&B, Covid) - Nasopharyngeal Swab     Status: Abnormal   Collection Time: 10/16/20 11:43 PM   Specimen: Nasopharyngeal Swab  Result Value Ref Range Status   SARS Coronavirus 2 by RT PCR POSITIVE (A) NEGATIVE Final    Comment: RESULT CALLED TO, READ BACK BY AND VERIFIED WITH: K [email or web address],RN@[id number]  10/17/20 MKELLY (NOTE) SARS-CoV-2 target nucleic acids are DETECTED.  SARS-CoV-2 RNA is generally detectable in upper respiratory specimens  during the acute phase of infection. Positive results are indicative of the presence of the identified virus, but do not rule out bacterial infection or co-infection with other pathogens not detected by the test. Clinical correlation with patient history and other diagnostic information is necessary to determine patient infection status. The expected result is Negative.  Fact Sheet for Patients:  https://www.moore.com/https://www.fda.gov/media/142436/download  Fact Sheet for Healthcare Providers: https://www.young.biz/https://www.fda.gov/media/142435/download  This test is not yet approved or cleared by the [place] FDA and  has been authorized for detection and/or diagnosis of SARS-CoV-2 by FDA under an Emergency Use Authorization (EUA).  This EUA will remain in effect (meaning this test can b e used) for the duration of  the COVID-19 declaration under Section 564(b)(1) of the Act, 21 U.S.C. section 360bbb-3(b)(1), unless the authorization is terminated or revoked sooner.      Influenza A by PCR NEGATIVE NEGATIVE Final   Influenza B by PCR NEGATIVE NEGATIVE Final    Comment: (NOTE) The Xpert Xpress SARS-CoV-2/FLU/RSV assay is intended as an aid in  the diagnosis of influenza from Nasopharyngeal swab specimens and  should not be used as a sole basis for treatment. Nasal  washings and  aspirates are unacceptable for Xpert Xpress SARS-CoV-2/FLU/RSV  testing.  Fact Sheet for Patients: https://www.moore.com/https://www.fda.gov/media/142436/download  Fact Sheet for Healthcare Providers: https://www.young.biz/  This test is not yet approved or cleared by the [place] FDA and  has been authorized for detection and/or diagnosis of SARS-CoV-2 by  FDA under an Emergency Use Authorization (EUA). This EUA will remain  in effect (meaning this test can be used) for the duration of the  Covid-19 declaration under Section 564(b)(1) of the Act, 21  U.S.C. section 360bbb-3(b)(1), unless the authorization is  terminated or revoked. Performed at [facility], [address], [place], [place] [address]   Blood culture (routine x 2)     Status: None (Preliminary result)   Collection Time: 10/16/20 11:48 PM   Specimen: BLOOD  Result Value Ref Range Status   Specimen Description BLOOD RIGHT ANTECUBITAL  Final   Special Requests   Final    BOTTLES DRAWN AEROBIC AND ANAEROBIC Blood Culture adequate volume   Culture   Final    NO GROWTH 4 DAYS Performed at [facility], [address] [place], [place] [address]    Report Status PENDING  Incomplete  Blood culture (routine x 2)     Status: None (Preliminary result)   Collection Time: 10/17/20 12:11 AM   Specimen: BLOOD LEFT HAND  Result Value Ref Range Status   Specimen Description BLOOD LEFT HAND  Final   Special Requests   Final    BOTTLES DRAWN AEROBIC AND ANAEROBIC Blood Culture adequate volume   Culture   Final    NO GROWTH 3 DAYS Performed at [facility], [address], [place], [place] [address]    Report Status PENDING  Incomplete    Radiology Studies: No results found.    Heidi [name] T. Breyonna Nault [facility]  If 7PM-7AM, please contact night-coverage [email or web address] 10/20/2020, 4:51 PM

## 2020-10-21 LAB — CBC WITH DIFFERENTIAL/PLATELET
Abs Immature Granulocytes: 0.09 10*3/uL — ABNORMAL HIGH (ref 0.00–0.07)
Basophils Absolute: 0 10*3/uL (ref 0.0–0.1)
Basophils Relative: 0 %
Eosinophils Absolute: 0 10*3/uL (ref 0.0–0.5)
Eosinophils Relative: 0 %
HCT: 44.2 % (ref 36.0–46.0)
Hemoglobin: 14.1 g/dL (ref 12.0–15.0)
Immature Granulocytes: 1 %
Lymphocytes Relative: 17 %
Lymphs Abs: 1.7 10*3/uL (ref 0.7–4.0)
MCH: 24.9 pg — ABNORMAL LOW (ref 26.0–34.0)
MCHC: 31.9 g/dL (ref 30.0–36.0)
MCV: 78.1 fL — ABNORMAL LOW (ref 80.0–100.0)
Monocytes Absolute: 0.3 10*3/uL (ref 0.1–1.0)
Monocytes Relative: 3 %
Neutro Abs: 7.9 10*3/uL — ABNORMAL HIGH (ref 1.7–7.7)
Neutrophils Relative %: 79 %
Platelets: 395 10*3/uL (ref 150–400)
RBC: 5.66 MIL/uL — ABNORMAL HIGH (ref 3.87–5.11)
RDW: 13.6 % (ref 11.5–15.5)
WBC: 10.1 10*3/uL (ref 4.0–10.5)
nRBC: 0 % (ref 0.0–0.2)

## 2020-10-21 LAB — COMPREHENSIVE METABOLIC PANEL
ALT: 32 U/L (ref 0–44)
AST: 25 U/L (ref 15–41)
Albumin: 3.8 g/dL (ref 3.5–5.0)
Alkaline Phosphatase: 55 U/L (ref 38–126)
Anion gap: 11 (ref 5–15)
BUN: 14 mg/dL (ref 6–20)
CO2: 17 mmol/L — ABNORMAL LOW (ref 22–32)
Calcium: 9 mg/dL (ref 8.9–10.3)
Chloride: 107 mmol/L (ref 98–111)
Creatinine, Ser: 0.55 mg/dL (ref 0.44–1.00)
GFR, Estimated: >60 mL/min (ref >60)
Glucose, Bld: 161 mg/dL — ABNORMAL HIGH (ref 70–99)
Potassium: 4 mmol/L (ref 3.5–5.1)
Sodium: 135 mmol/L (ref 135–145)
Total Bilirubin: 0.7 mg/dL (ref 0.3–1.2)
Total Protein: 7.8 g/dL (ref 6.5–8.1)

## 2020-10-21 LAB — MAGNESIUM: Magnesium: 2.2 mg/dL (ref 1.7–2.4)

## 2020-10-21 LAB — FERRITIN: Ferritin: 134 ng/mL (ref 11–307)

## 2020-10-21 LAB — PHOSPHORUS: Phosphorus: 3.7 mg/dL (ref 2.5–4.6)

## 2020-10-21 LAB — D-DIMER, QUANTITATIVE: D-Dimer, Quant: 0.62 ug/mL-FEU — ABNORMAL HIGH (ref 0.00–0.50)

## 2020-10-21 LAB — C-REACTIVE PROTEIN: CRP: 0.7 mg/dL (ref <1.0)

## 2020-10-21 MED ORDER — FENTANYL 12 MCG/HR TD PT72: 1.0000 | MEDICATED_PATCH | TRANSDERMAL | 0 refills | Status: AC

## 2020-10-21 NOTE — Discharge Summary (Signed)
Physician Discharge Summary  Heidi [name] LEX:[id number] DOB: 07-Mar-1986 DOA: 10/16/2020  PCP: Patient, No Pcp Per  Admit date: 10/16/2020 Discharge date: 10/21/2020  Admitted From: Home Disposition: Home  Recommendations for Outpatient Follow-up:  1. Follow ups as below. 2. Please obtain CBC/BMP/Mag at follow up 3. Please follow up on the following pending results: None  Home Health: None required Equipment/Devices: None required  Discharge Condition: Stable CODE STATUS: Full code   Hospital Course: 34 year old female with history of anxiety, chronic pain, IVDU (heroin) and hep C presenting with lethargy and hyperventilation, and admitted for COVID-19 pneumonia.  In ED, febrile to 101.9.  WBC 17.8.  ABG consistent with respiratory alkalosis.  Salicylate, Tylenol and lactic levels within normal.  CXR with right basilar infiltrate.  She tested positive for COVID-19 and admitted for COVID-19 pneumonia, hypokalemia and possible opiate withdrawal.  Started on remdesivir and Decadron.  Procalcitonin negative.  On the day of discharge, patient completed 5 days course of remdesivir.  No oxygen requirement. Discharged home in stable condition.  Counseled on return and isolation precautions.  In regards to heroin use, she was given prescription for 12 mcg fentanyl patch, #3 until she follows up at his [facility] clinic.  Blood cultures negative.  No stigmata of endocarditis.  Discharge Diagnoses:  Severe sepsis secondary to COVID-19 pneumonia: POA.  Symptomatic for 3 days.  Heart tachycardia, fever, leukocytosis and encephalopathy on presentation.  CXR with right basilar infiltrate.  Procalcitonin negative.  Completed 5 days of remdesivir.  Received Decadron for 5 days.  Evaluated by therapy and ambulated.  No respiratory distress or oxygen requirement with ambulation. -Counseled on return and isolation precautions. -Encouraged to gait COVID-19 vaccine 3 to 4 weeks from her positive test  result  Acute toxic metabolic encephalopathy-history of IVDU.  Also COVID-19 infection.  Resolved.  IVDU with injectable heroin/possible opiate withdrawal: Reports following with Suboxone clinic.  Blood cultures negative.  No stigmata of endocarditis. -Counseled.  Gave Rx for 12 mcg fentanyl patch, #3 until she follows up with Suboxone clinic  Hypokalemia/hypophosphatemia: Resolved.  Body mass index is 20.96 kg/m.            Discharge Exam: Vitals:   10/20/20 2013 10/21/20 0502  BP: 119/79 110/84  Pulse: (!) 56 69  Resp: (!) 23 (!) 21  Temp: 98 F (36.7 C) 98.1 F (36.7 C)  SpO2: 95% 98%    GENERAL: No apparent distress.  Nontoxic. HEENT: MMM.  Vision and hearing grossly intact.  NECK: Supple.  No apparent JVD.  RESP:  No IWOB.  Fair aeration bilaterally. CVS:  RRR. Heart sounds normal.  ABD/GI/GU: Bowel sounds present. Soft. Non tender.  MSK/EXT:  Moves extremities. No apparent deformity. No edema.  SKIN: no apparent skin lesion or wound NEURO: Awake, alert and oriented appropriately.  No apparent focal neuro deficit. PSYCH: Calm. Normal affect.  Discharge Instructions  Discharge Instructions    Call MD for:  difficulty breathing, headache or visual disturbances   Complete by: As directed    Call MD for:  extreme fatigue   Complete by: As directed    Diet general   Complete by: As directed    Discharge instructions   Complete by: As directed    It has been a pleasure taking care of you!  You were hospitalized and treated for COVID-19 infection.   You are still potentially infectious at least for 21 days from the date you were tested positive.  We recommend you isolate yourself and  take the necessary precautions to prevent the virus from spreading.  We also recommend getting you COVID-19 vaccination 3 to 4 weeks from the date you tested positive.  Some of the steps to prevent the virus from spreading to others: Stay away from other and members of your  household for 21 days after you tested positive.  Let healthy household members care for children and pets, if possible. If you have to care for children or pets, wash your hands often and wear a mask. If possible, stay in your own room, separate from others. Use a different bathroom.Make sure that all people in your household wash their hands well and often. Leave your house only to seek medical care. Do not use public transport. Do not travel while you are sick. Wash your hands often with soap and water for 20 seconds. If soap and water are not available, use alcohol-based hand sanitizer. Cough or sneeze into a tissue or your sleeve or elbow. Do not cough or sneeze into your hand or into the air. Wear a cloth face covering or face mask.  Return precautions: Get help or return to the hospital right away if: You have trouble breathing. You have pain or pressure in your chest. You have confusion. You have bluish lips and fingernails. You have difficulty waking from sleep. You have symptoms that get worse. These symptoms may represent a serious problem that is an emergency. Do not wait to see if the symptoms will go away. Get medical help right away. Call your local emergency services (911 in the [place] Do not drive yourself to the hospital. Let the emergency medical personnel know if you think you have COVID-19.  To protect yourself in the future:  Do not travel to areas where COVID-19 is a risk. The areas where COVID-19 is reported change often. To identify high-risk areas and travel restrictions, check the CDC travel website: StageSync.siwwwnc.cdc.gov/travel/notices If you live in, or must travel to, an area where COVID-19 is a risk, take precautions to avoid infection. Stay away from people who are sick. Wash your hands often with soap and water for 20 seconds. If soap and water are not available, use an alcohol-based hand sanitizer. Avoid touching your mouth, face, eyes, or nose. Avoid going out in  public, follow guidance from your state and local health authorities. If you must go out in public, wear a cloth face covering or face mask. Disinfect objects and surfaces that are frequently touched every day. This may include: Counters and tables. Doorknobs and light switches. Sinks and faucets. Electronics, such as phones, remote controls, keyboards, computers, and tablets.    Where to find more information Centers for Disease Control and Prevention: StickerEmporium.tnwww.cdc.gov/coronavirus/2019-ncov/index.html World Health Organization: https://thompson-craig.com/www.who.int/health-topics/coronavirus   Increase activity slowly   Complete by: As directed      Allergies as of 10/21/2020      Reactions   Penicillins Anaphylaxis, Hives, Other (See Comments)   Has patient had a PCN reaction causing immediate rash, facial/tongue/throat swelling, SOB or lightheadedness with hypotension: Yes Has patient had a PCN reaction causing severe rash involving mucus membranes or skin necrosis: No Has patient had a PCN reaction that required hospitalization No Has patient had a PCN reaction occurring within the last 10 years: No If all of the above answers are "NO", then may proceed with Cephalosporin use. headaches   Tylenol [acetaminophen] Hives   Venlafaxine Hives, Other (See Comments)   Headaches   Morphine And Related Hives, Other (See Comments)  Reaction: headaches      Medication List    STOP taking these medications   clindamycin 150 MG capsule Commonly known as: CLEOCIN     TAKE these medications   buprenorphine 8 MG Subl SL tablet Commonly known as: SUBUTEX Place 8 mg under the tongue every 8 (eight) hours. Last filled 09-29-20   fentaNYL 12 MCG/HR Commonly known as: DURAGESIC Place 1 patch onto the skin every 3 (three) days. Start taking on: October 23, 2020   gabapentin 300 MG capsule Commonly known as: NEURONTIN Take 300 mg by mouth 3 (three) times daily. Last filled 09-16-20 for 30days   levonorgestrel 20  MCG/24HR IUD Commonly known as: MIRENA 1 each by Intrauterine route once.   sertraline 100 MG tablet Commonly known as: ZOLOFT Take 100 mg by mouth daily. Last filled 06-18-20 30day supply       Consultations:  None  Procedures/Studies   DG Chest Portable 1 View  Result Date: 10/17/2020 CLINICAL DATA:  Hypoxia, rales EXAM: PORTABLE CHEST 1 VIEW COMPARISON:  04/15/2016 FINDINGS: There has developed focal airspace infiltrate within the right lung base, likely infectious in the acute setting. No pneumothorax or pleural effusion. Cardiac size within normal limits. No acute bone abnormality. IMPRESSION: Right basilar pneumonic infiltrate. Electronically Signed   By: Helyn Numbers MD   On: 10/17/2020 00:20        The results of significant diagnostics from this hospitalization (including imaging, microbiology, ancillary and laboratory) are listed below for reference.     Microbiology: Recent Results (from the past 240 hour(s))  Respiratory Panel by RT PCR (Flu A&B, Covid) - Nasopharyngeal Swab     Status: Abnormal   Collection Time: 10/16/20 11:43 PM   Specimen: Nasopharyngeal Swab  Result Value Ref Range Status   SARS Coronavirus 2 by RT PCR POSITIVE (A) NEGATIVE Final    Comment: RESULT CALLED TO, READ BACK BY AND VERIFIED WITH: K WATLUNGTON,RN@0117  10/17/20 MKELLY (NOTE) SARS-CoV-2 target nucleic acids are DETECTED.  SARS-CoV-2 RNA is generally detectable in upper respiratory specimens  during the acute phase of infection. Positive results are indicative of the presence of the identified virus, but do not rule out bacterial infection or co-infection with other pathogens not detected by the test. Clinical correlation with patient history and other diagnostic information is necessary to determine patient infection status. The expected result is Negative.  Fact Sheet for Patients:  https://www.moore.com/  Fact Sheet for Healthcare  Providers: https://www.young.biz/  This test is not yet approved or cleared by the [place] FDA and  has been authorized for detection and/or diagnosis of SARS-CoV-2 by FDA under an Emergency Use Authorization (EUA).  This EUA will remain in effect (meaning this test can b e used) for the duration of  the COVID-19 declaration under Section 564(b)(1) of the Act, 21 U.S.C. section 360bbb-3(b)(1), unless the authorization is terminated or revoked sooner.      Influenza A by PCR NEGATIVE NEGATIVE Final   Influenza B by PCR NEGATIVE NEGATIVE Final    Comment: (NOTE) The Xpert Xpress SARS-CoV-2/FLU/RSV assay is intended as an aid in  the diagnosis of influenza from Nasopharyngeal swab specimens and  should not be used as a sole basis for treatment. Nasal washings and  aspirates are unacceptable for Xpert Xpress SARS-CoV-2/FLU/RSV  testing.  Fact Sheet for Patients: https://www.moore.com/  Fact Sheet for Healthcare Providers: https://www.young.biz/  This test is not yet approved or cleared by the [place] FDA and  has been authorized for detection  and/or diagnosis of SARS-CoV-2 by  FDA under an Emergency Use Authorization (EUA). This EUA will remain  in effect (meaning this test can be used) for the duration of the  Covid-19 declaration under Section 564(b)(1) of the Act, 21  U.S.C. section 360bbb-3(b)(1), unless the authorization is  terminated or revoked. Performed at [facility], [address], [place], [place] [address]   Blood culture (routine x 2)     Status: None (Preliminary result)   Collection Time: 10/16/20 11:48 PM   Specimen: BLOOD  Result Value Ref Range Status   Specimen Description BLOOD RIGHT ANTECUBITAL  Final   Special Requests   Final    BOTTLES DRAWN AEROBIC AND ANAEROBIC Blood Culture adequate volume   Culture   Final    NO GROWTH 4 DAYS Performed at [facility], [address],  [place], [place] [address]    Report Status PENDING  Incomplete  Blood culture (routine x 2)     Status: None (Preliminary result)   Collection Time: 10/17/20 12:11 AM   Specimen: BLOOD LEFT HAND  Result Value Ref Range Status   Specimen Description BLOOD LEFT HAND  Final   Special Requests   Final    BOTTLES DRAWN AEROBIC AND ANAEROBIC Blood Culture adequate volume   Culture   Final    NO GROWTH 3 DAYS Performed at [facility], [address] [place], [place] [address]    Report Status PENDING  Incomplete     Labs: BNP (last 3 results) No results for input(s): BNP in the last 8760 hours. Basic Metabolic Panel: Recent Labs  Lab 10/16/20 2348 10/18/20 0501 10/19/20 0424 10/20/20 0352 10/21/20 0512  NA 137 139 137 136 135  K 2.5* 3.0* 2.9* 3.9 4.0  CL 110 109 106 109 107  CO2 20* 21* 19* 18* 17*  GLUCOSE 121* 107* 142* 123* 161*  BUN 17 19 23* 13 14  CREATININE 0.55 0.55 0.60 0.46 0.55  CALCIUM 8.5* 8.4* 8.4* 8.6* 9.0  MG  --  2.2 2.2 2.1 2.2  PHOS  --  2.5 2.1* 3.9 3.7   Liver Function Tests: Recent Labs  Lab 10/18/20 0501 10/19/20 0424 10/20/20 0352 10/21/20 0512  AST 18 13* 11* 25  ALT 50* 34 26 32  ALKPHOS 54 53 49 55  BILITOT 0.8 0.6 0.5 0.7  PROT 6.7 7.2 6.9 7.8  ALBUMIN 3.2* 3.3* 3.2* 3.8   No results for input(s): LIPASE, AMYLASE in the last 168 hours. No results for input(s): AMMONIA in the last 168 hours. CBC: Recent Labs  Lab 10/16/20 2348 10/18/20 0501 10/19/20 0424 10/20/20 0352 10/21/20 0512  WBC 17.8* 10.1 8.3 7.0 10.1  NEUTROABS 15.7* 9.1* 7.0 5.5 7.9*  HGB 13.1 12.1 12.6 12.6 14.1  HCT 42.1 37.8 39.1 39.5 44.2  MCV 82.4 79.7* 79.3* 80.1 78.1*  PLT 277 210 242 316 395   Cardiac Enzymes: No results for input(s): CKTOTAL, CKMB, CKMBINDEX, TROPONINI in the last 168 hours. BNP: Invalid input(s): POCBNP CBG: Recent Labs  Lab 10/17/20 1012  GLUCAP 102*   D-Dimer Recent Labs    10/20/20 0352 10/21/20 0512  DDIMER 0.61* 0.62*    Hgb A1c No results for input(s): HGBA1C in the last 72 hours. Lipid Profile No results for input(s): CHOL, HDL, LDLCALC, TRIG, CHOLHDL, LDLDIRECT in the last 72 hours. Thyroid function studies No results for input(s): TSH, T4TOTAL, T3FREE, THYROIDAB in the last 72 hours.  Invalid input(s): FREET3 Anemia work up [employer]  10/20/20 0614 10/21/20 0512  FERRITIN 147 134   Urinalysis    Component Value Date/Time   COLORURINE YELLOW 08/05/2017 1801   APPEARANCEUR CLOUDY (A) 08/05/2017 1801   LABSPEC 1.017 08/05/2017 1801   PHURINE 5.0 08/05/2017 1801   GLUCOSEU NEGATIVE 08/05/2017 1801   HGBUR SMALL (A) 08/05/2017 1801   BILIRUBINUR NEGATIVE 08/05/2017 1801   KETONESUR NEGATIVE 08/05/2017 1801   PROTEINUR 30 (A) 08/05/2017 1801   UROBILINOGEN 0.2 07/26/2015 0156   NITRITE NEGATIVE 08/05/2017 1801   LEUKOCYTESUR LARGE (A) 08/05/2017 1801   Sepsis Labs Invalid input(s): PROCALCITONIN,  WBC,  LACTICIDVEN   Time coordinating discharge: 35 minutes  SIGNED:  Almon Hercules, MD  [facility] 10/21/2020, 2:55 PM  If 7PM-7AM, please contact night-coverage [email or web address]

## 2020-10-22 LAB — CULTURE, BLOOD (ROUTINE X 2)
Culture: NO GROWTH
Culture: NO GROWTH
Special Requests: ADEQUATE
Special Requests: ADEQUATE

## 2021-02-20 ENCOUNTER — Other Ambulatory Visit: Payer: Self-pay

## 2021-02-20 ENCOUNTER — Encounter (HOSPITAL_COMMUNITY): Payer: Self-pay | Admitting: *Deleted

## 2021-02-20 ENCOUNTER — Emergency Department (HOSPITAL_COMMUNITY)
Admission: EM | Admit: 2021-02-20 | Discharge: 2021-02-20 | Disposition: A | Discharge status: Home / Self Care | Payer: Medicaid Other | Attending: Emergency Medicine | Admitting: Emergency Medicine

## 2021-02-20 DIAGNOSIS — Z8616 Personal history of COVID-19: Secondary | ICD-10-CM | POA: Diagnosis not present

## 2021-02-20 DIAGNOSIS — F17210 Nicotine dependence, cigarettes, uncomplicated: Secondary | ICD-10-CM | POA: Diagnosis not present

## 2021-02-20 DIAGNOSIS — R109 Unspecified abdominal pain: Secondary | ICD-10-CM | POA: Insufficient documentation

## 2021-02-20 DIAGNOSIS — R350 Frequency of micturition: Secondary | ICD-10-CM | POA: Insufficient documentation

## 2021-02-20 DIAGNOSIS — M7918 Myalgia, other site: Secondary | ICD-10-CM

## 2021-02-20 LAB — CBC WITH DIFFERENTIAL/PLATELET
Abs Immature Granulocytes: 0.01 10*3/uL (ref 0.00–0.07)
Basophils Absolute: 0 10*3/uL (ref 0.0–0.1)
Basophils Relative: 1 %
Eosinophils Absolute: 0 10*3/uL (ref 0.0–0.5)
Eosinophils Relative: 1 %
HCT: 41.8 % (ref 36.0–46.0)
Hemoglobin: 13.2 g/dL (ref 12.0–15.0)
Immature Granulocytes: 0 %
Lymphocytes Relative: 29 %
Lymphs Abs: 1.9 10*3/uL (ref 0.7–4.0)
MCH: 25.8 pg — ABNORMAL LOW (ref 26.0–34.0)
MCHC: 31.6 g/dL (ref 30.0–36.0)
MCV: 81.6 fL (ref 80.0–100.0)
Monocytes Absolute: 0.4 10*3/uL (ref 0.1–1.0)
Monocytes Relative: 6 %
Neutro Abs: 4.1 10*3/uL (ref 1.7–7.7)
Neutrophils Relative %: 63 %
Platelets: 300 10*3/uL (ref 150–400)
RBC: 5.12 MIL/uL — ABNORMAL HIGH (ref 3.87–5.11)
RDW: 13.2 % (ref 11.5–15.5)
WBC: 6.4 10*3/uL (ref 4.0–10.5)
nRBC: 0 % (ref 0.0–0.2)

## 2021-02-20 LAB — URINALYSIS, ROUTINE W REFLEX MICROSCOPIC
Bilirubin Urine: NEGATIVE
Glucose, UA: NEGATIVE mg/dL
Hgb urine dipstick: NEGATIVE
Ketones, ur: NEGATIVE mg/dL
Leukocytes,Ua: NEGATIVE
Nitrite: NEGATIVE
Protein, ur: NEGATIVE mg/dL
Specific Gravity, Urine: 1.01 (ref 1.005–1.030)
pH: 6 (ref 5.0–8.0)

## 2021-02-20 LAB — PREGNANCY, URINE: Preg Test, Ur: NEGATIVE

## 2021-02-20 LAB — BASIC METABOLIC PANEL
Anion gap: 10 (ref 5–15)
BUN: 15 mg/dL (ref 6–20)
CO2: 27 mmol/L (ref 22–32)
Calcium: 9.2 mg/dL (ref 8.9–10.3)
Chloride: 99 mmol/L (ref 98–111)
Creatinine, Ser: 0.62 mg/dL (ref 0.44–1.00)
GFR, Estimated: >60 mL/min (ref >60)
Glucose, Bld: 129 mg/dL — ABNORMAL HIGH (ref 70–99)
Potassium: 3.9 mmol/L (ref 3.5–5.1)
Sodium: 136 mmol/L (ref 135–145)

## 2021-02-20 LAB — HEPATIC FUNCTION PANEL
ALT: 32 U/L (ref 0–44)
AST: 22 U/L (ref 15–41)
Albumin: 3.9 g/dL (ref 3.5–5.0)
Alkaline Phosphatase: 97 U/L (ref 38–126)
Bilirubin, Direct: <0.1 mg/dL (ref 0.0–0.2)
Total Bilirubin: 0.3 mg/dL (ref 0.3–1.2)
Total Protein: 7.5 g/dL (ref 6.5–8.1)

## 2021-02-20 LAB — LIPASE, BLOOD: Lipase: 28 U/L (ref 11–51)

## 2021-02-20 MED ORDER — KETOROLAC TROMETHAMINE 30 MG/ML IJ SOLN
30.0000 mg | Freq: Once | INTRAMUSCULAR | Status: AC
  Administered 2021-02-20: 30 mg via INTRAVENOUS
  Filled 2021-02-20: qty 1

## 2021-02-20 NOTE — Discharge Instructions (Addendum)
Your work up was reassuring in the ED without any signs of urinary tract infection or other abnormalities. Your pain may be musculoskeletal in nature. I would recommend continuing to take Ibuprofen as needed for pain.   Attached are two primary are providers in the area that you can follow up with.   Return to the ED for any worsening symptoms

## 2021-02-20 NOTE — ED Triage Notes (Signed)
Flank pain with dysuria onset 2 days ago

## 2021-02-20 NOTE — ED Provider Notes (Signed)
[facility] EMERGENCY DEPARTMENT Provider Note   CSN: [id number] Arrival date & time: 02/20/21  1045     History No chief complaint on file.   Heidi [name] is a 35 y.o. female who presents to the ED today with complaint of gradual onset, constant, sharp, left flank pain x 2 days. Pt also complains of urinary urgency and frequency. She reports history of "kidney infection" in the past and states this feels similar. Per chart review pt was seen in the ED in 2018 for UTI. Pt has been taking Ibuprofen for pain with mild relief. She mentions that she has a mirena IUD in place however it was supposed to be taken out approximately 3 years ago. She states that she does not have menstrual cycles due to her IUD. Pt denies fevers, chills, nausea, vomiting, dysuria, hematuria, pelvic pain, vaginal discharge, or any other associated symptoms.   The history is provided by the patient and medical records.       Past Medical History:  Diagnosis Date  . Abnormal Pap smear of vagina   . Anxiety   . Chronic shoulder pain   . Drug use   . Hepatitis C   . Polysubstance abuse [facility])     Patient Active Problem List   Diagnosis Date Noted  . Pneumonia due to COVID-19 virus 10/17/2020  . Opiate withdrawal (HCC) 10/17/2020  . Sepsis due to pneumonia (HCC) 10/17/2020  . Altered mental state 09/06/2016  . AKI (acute kidney injury) (HCC) 09/06/2016  . Normocytic anemia 09/06/2016  . Obtundation 09/06/2016  . Altered mental status   . Substance induced mood disorder (HCC) 05/12/2016  . Elevated LFTs 06/14/2014  . Hepatitis C, acute 06/05/2014  . Polysubstance abuse (HCC) 06/04/2014  . Acute hepatitis 06/03/2014  . Cholestatic jaundice 06/03/2014  . Cystitis, acute 06/03/2014  . Chronic pain syndrome 06/03/2014  . Hypokalemia 06/03/2014  . CLOSED FRACTURE OF MEDIAL MALLEOLUS 06/20/2010  . ANKLE SPRAIN 01/07/2008    Past Surgical History:  Procedure Laterality Date  . ANKLE SURGERY        OB History   No obstetric history on file.     Family History  Problem Relation Age of Onset  . Cirrhosis Father   . Hepatitis C Father     Social History   Tobacco Use  . Smoking status: Current Every Day Smoker    Packs/day: 1.00    Types: Cigarettes  . Smokeless tobacco: Never Used  Substance Use Topics  . Alcohol use: No  . Drug use: Yes    Types: Heroin, Marijuana, Methamphetamines, IV    Comment: Heroin     Home Medications Prior to Admission medications   Medication Sig Start Date End Date Taking? Authorizing Provider  buprenorphine (SUBUTEX) 8 MG SUBL SL tablet Place 8 mg under the tongue every 8 (eight) hours. Last filled 09-29-20 09/29/20   [provider]  fentaNYL (DURAGESIC) 12 MCG/HR Place 1 patch onto the skin every 3 (three) days. 10/23/20   Almon Hercules, MD  gabapentin (NEURONTIN) 300 MG capsule Take 300 mg by mouth 3 (three) times daily. Last filled 09-16-20 for 30days 10/06/20   [provider]  levonorgestrel (MIRENA) 20 MCG/24HR IUD 1 each by Intrauterine route once.    [provider]  sertraline (ZOLOFT) 100 MG tablet Take 100 mg by mouth daily. Last filled 06-18-20 30day supply 06/18/20   [provider]    Allergies    Penicillins, Tylenol [acetaminophen], Venlafaxine, and Morphine  and related  Review of Systems   Review of Systems  Constitutional: Negative for chills and fever.  Gastrointestinal: Positive for abdominal pain. Negative for constipation, diarrhea, nausea and vomiting.  Genitourinary: Positive for flank pain, frequency and urgency. Negative for dysuria, hematuria, vaginal bleeding and vaginal discharge.  All other systems reviewed and are negative.   Physical Exam Updated Vital Signs BP 128/85   Pulse 72   Temp 97.9 F (36.6 C) (Oral)   Resp 18   Ht 5\' 7"  (1.702 m)   Wt 60.8 kg   SpO2 98%   BMI 20.99 kg/m   Physical Exam Vitals and nursing note reviewed.  Constitutional:       Appearance: She is not ill-appearing or diaphoretic.  HENT:     Head: Normocephalic and atraumatic.  Eyes:     Conjunctiva/sclera: Conjunctivae normal.  Cardiovascular:     Rate and Rhythm: Normal rate and regular rhythm.     Pulses: Normal pulses.  Pulmonary:     Effort: Pulmonary effort is normal.     Breath sounds: Normal breath sounds. No wheezing, rhonchi or rales.  Abdominal:     Palpations: Abdomen is soft.     Tenderness: There is abdominal tenderness. There is left CVA tenderness. There is no right CVA tenderness, guarding or rebound.     Comments: Soft, + left CVA TTP as well as left flank TTP, +BS throughout, no r/g/r, neg murphy's, neg mcburney's  Musculoskeletal:     Cervical back: Neck supple.  Skin:    General: Skin is warm and dry.  Neurological:     Mental Status: She is alert.     ED Results / Procedures / Treatments   Labs (all labs ordered are listed, but only abnormal results are displayed) Labs Reviewed  BASIC METABOLIC PANEL - Abnormal; Notable for the following components:      Result Value   Glucose, Bld 129 (*)    All other components within normal limits  CBC WITH DIFFERENTIAL/PLATELET - Abnormal; Notable for the following components:   RBC 5.12 (*)    MCH 25.8 (*)    All other components within normal limits  PREGNANCY, URINE  URINALYSIS, ROUTINE W REFLEX MICROSCOPIC  HEPATIC FUNCTION PANEL  LIPASE, BLOOD    EKG None  Radiology No results found.  Procedures Procedures   Medications Ordered in ED Medications  ketorolac (TORADOL) 30 MG/ML injection 30 mg (30 mg Intravenous Given 02/20/21 1250)    ED Course  I have reviewed the triage vital signs and the nursing notes.  Pertinent labs & imaging results that were available during my care of the patient were reviewed by me and considered in my medical decision making (see chart for details).    MDM Rules/Calculators/A&P                          35 year old female presents to the ED  today with complaint of left flank pain, urgency, frequency for the past 2 days. On arrival to the ED vitals are stable.  Patient appears to be in no acute distress.  She is afebrile, nontachycardic nontachypneic.  She is found sleeping comfortably in her room.  On exam she does have left CVA tenderness palpation as well as left flank tenderness palpation, no rebound or guarding to abdomen.  She denies any vaginal discharge, pelvic pain.  She does have a Mirena IUD in place however states it needed to be taken out approximately  3 years ago.  She does not have regular menstrual cycles due to IUD.  We will plan for CBC, BMP, urinalysis, urine pregnancy test.  Will provide Toradol for pain.   CBC without leukocytosis. Hgb stable at 13.2 U/A without signs of infection or blood.  UPT negative  Lipase and LFTs added on given urine completely normal without any abnormality. Also normal at this time.   Pt continues to rest comfortably in the room without any other complaints at this time. Given completely normal work up do not feel she requires any additional intervention in the ED. Suspect musculoskeletal pain. Will provide information for PCP follow up. Pt instructed to continue taking Ibuprofen for pain. She is in agreement with plan and stable for discharge home.   This note was prepared using Dragon voice recognition software and may include unintentional dictation errors due to the inherent limitations of voice recognition software.   Final Clinical Impression(s) / ED Diagnoses Final diagnoses:  Flank pain  Musculoskeletal pain    Rx / DC Orders ED Discharge Orders    None       Discharge Instructions     Your work up was reassuring in the ED without any signs of urinary tract infection or other abnormalities. Your pain may be musculoskeletal in nature. I would recommend continuing to take Ibuprofen as needed for pain.   Attached are two primary are providers in the area that you can  follow up with.   Return to the ED for any worsening symptoms       Tanda Rockers, PA-C 02/20/21 1344    Jacalyn Lefevre, MD 02/20/21 1413

## 2021-02-20 NOTE — ED Triage Notes (Signed)
Patient texting entire time in triage

## 2021-06-12 ENCOUNTER — Emergency Department (HOSPITAL_COMMUNITY)
Admission: EM | Admit: 2021-06-12 | Discharge: 2021-06-13 | Disposition: A | Discharge status: Home / Self Care | Payer: Medicaid Other | Attending: Emergency Medicine | Admitting: Emergency Medicine

## 2021-06-12 DIAGNOSIS — T50914A Poisoning by multiple unspecified drugs, medicaments and biological substances, undetermined, initial encounter: Secondary | ICD-10-CM | POA: Insufficient documentation

## 2021-06-12 DIAGNOSIS — X58XXXA Exposure to other specified factors, initial encounter: Secondary | ICD-10-CM | POA: Insufficient documentation

## 2021-06-12 DIAGNOSIS — Y900 Blood alcohol level of less than 20 mg/100 ml: Secondary | ICD-10-CM | POA: Diagnosis not present

## 2021-06-12 DIAGNOSIS — Z008 Encounter for other general examination: Secondary | ICD-10-CM

## 2021-06-12 DIAGNOSIS — F17210 Nicotine dependence, cigarettes, uncomplicated: Secondary | ICD-10-CM | POA: Insufficient documentation

## 2021-06-12 DIAGNOSIS — T50904A Poisoning by unspecified drugs, medicaments and biological substances, undetermined, initial encounter: Secondary | ICD-10-CM

## 2021-06-12 DIAGNOSIS — R9431 Abnormal electrocardiogram [ECG] [EKG]: Secondary | ICD-10-CM

## 2021-06-12 DIAGNOSIS — Z20822 Contact with and (suspected) exposure to covid-19: Secondary | ICD-10-CM | POA: Diagnosis not present

## 2021-06-12 DIAGNOSIS — F1910 Other psychoactive substance abuse, uncomplicated: Secondary | ICD-10-CM | POA: Diagnosis not present

## 2021-06-12 DIAGNOSIS — Z8616 Personal history of COVID-19: Secondary | ICD-10-CM | POA: Diagnosis not present

## 2021-06-12 LAB — COMPREHENSIVE METABOLIC PANEL
ALT: 15 U/L (ref 0–44)
AST: 19 U/L (ref 15–41)
Albumin: 3.8 g/dL (ref 3.5–5.0)
Alkaline Phosphatase: 80 U/L (ref 38–126)
Anion gap: 9 (ref 5–15)
BUN: 17 mg/dL (ref 6–20)
CO2: 24 mmol/L (ref 22–32)
Calcium: 8.9 mg/dL (ref 8.9–10.3)
Chloride: 108 mmol/L (ref 98–111)
Creatinine, Ser: 0.66 mg/dL (ref 0.44–1.00)
GFR, Estimated: >60 mL/min (ref >60)
Glucose, Bld: 103 mg/dL — ABNORMAL HIGH (ref 70–99)
Potassium: 3.1 mmol/L — ABNORMAL LOW (ref 3.5–5.1)
Sodium: 141 mmol/L (ref 135–145)
Total Bilirubin: 0.5 mg/dL (ref 0.3–1.2)
Total Protein: 7.7 g/dL (ref 6.5–8.1)

## 2021-06-12 LAB — CBC WITH DIFFERENTIAL/PLATELET
Abs Immature Granulocytes: 0.01 10*3/uL (ref 0.00–0.07)
Basophils Absolute: 0.1 10*3/uL (ref 0.0–0.1)
Basophils Relative: 1 %
Eosinophils Absolute: 0.1 10*3/uL (ref 0.0–0.5)
Eosinophils Relative: 2 %
HCT: 37.4 % (ref 36.0–46.0)
Hemoglobin: 11.9 g/dL — ABNORMAL LOW (ref 12.0–15.0)
Immature Granulocytes: 0 %
Lymphocytes Relative: 30 %
Lymphs Abs: 1.8 10*3/uL (ref 0.7–4.0)
MCH: 25.7 pg — ABNORMAL LOW (ref 26.0–34.0)
MCHC: 31.8 g/dL (ref 30.0–36.0)
MCV: 80.8 fL (ref 80.0–100.0)
Monocytes Absolute: 0.6 10*3/uL (ref 0.1–1.0)
Monocytes Relative: 10 %
Neutro Abs: 3.6 10*3/uL (ref 1.7–7.7)
Neutrophils Relative %: 57 %
Platelets: 268 10*3/uL (ref 150–400)
RBC: 4.63 MIL/uL (ref 3.87–5.11)
RDW: 13.9 % (ref 11.5–15.5)
WBC: 6.1 10*3/uL (ref 4.0–10.5)
nRBC: 0 % (ref 0.0–0.2)

## 2021-06-12 LAB — SALICYLATE LEVEL: Salicylate Lvl: <7 mg/dL — ABNORMAL LOW (ref 7.0–30.0)

## 2021-06-12 LAB — RESP PANEL BY RT-PCR (FLU A&B, COVID) ARPGX2
Influenza A by PCR: NEGATIVE
Influenza B by PCR: NEGATIVE
SARS Coronavirus 2 by RT PCR: NEGATIVE

## 2021-06-12 LAB — HCG, SERUM, QUALITATIVE: Preg, Serum: NEGATIVE

## 2021-06-12 LAB — ACETAMINOPHEN LEVEL: Acetaminophen (Tylenol), Serum: <10 ug/mL — ABNORMAL LOW (ref 10–30)

## 2021-06-12 LAB — AMMONIA: Ammonia: 39 umol/L — ABNORMAL HIGH (ref 9–35)

## 2021-06-12 LAB — ETHANOL: Alcohol, Ethyl (B): <10 mg/dL (ref <10)

## 2021-06-12 NOTE — ED Provider Notes (Signed)
[facility] EMERGENCY DEPARTMENT Provider Note   CSN: [id number] Arrival date & time: 06/12/21  2037     History Chief Complaint  Patient presents with   Medical Clearance    Heidi [name] is a 35 y.o. female who presents today in custody of police for medical clearance.  She has a history of polysubstance abuse, hepatitis C, anxiety, chronic pain.  She was reportedly found in a car altered.  She did not receive any Narcan prior to arrival however awakened on her own.  She tells me that she took something and then mumbles and is unable to answer questions before falling asleep.  Level 5 caveat altered mental status.    HPI     Past Medical History:  Diagnosis Date   Abnormal Pap smear of vagina    Anxiety    Chronic shoulder pain    Drug use    Hepatitis C    Polysubstance abuse (HCC)     Patient Active Problem List   Diagnosis Date Noted   Pneumonia due to COVID-19 virus 10/17/2020   Opiate withdrawal (HCC) 10/17/2020   Sepsis due to pneumonia (HCC) 10/17/2020   Altered mental state 09/06/2016   AKI (acute kidney injury) (HCC) 09/06/2016   Normocytic anemia 09/06/2016   Obtundation 09/06/2016   Altered mental status    Substance induced mood disorder (HCC) 05/12/2016   Elevated LFTs 06/14/2014   Hepatitis C, acute 06/05/2014   Polysubstance abuse (HCC) 06/04/2014   Acute hepatitis 06/03/2014   Cholestatic jaundice 06/03/2014   Cystitis, acute 06/03/2014   Chronic pain syndrome 06/03/2014   Hypokalemia 06/03/2014   CLOSED FRACTURE OF MEDIAL MALLEOLUS 06/20/2010   ANKLE SPRAIN 01/07/2008    Past Surgical History:  Procedure Laterality Date   ANKLE SURGERY       OB History   No obstetric history on file.     Family History  Problem Relation Age of Onset   Cirrhosis Father    Hepatitis C Father     Social History   Tobacco Use   Smoking status: Every Day    Packs/day: 1.00    Pack years: 0.00    Types: Cigarettes   Smokeless tobacco:  Never  Substance Use Topics   Alcohol use: No   Drug use: Yes    Types: Heroin, Marijuana, Methamphetamines, IV    Comment: Heroin     Home Medications Prior to Admission medications   Medication Sig Start Date End Date Taking? Authorizing Provider  buprenorphine (SUBUTEX) 8 MG SUBL SL tablet Place 8 mg under the tongue every 8 (eight) hours. Last filled 09-29-20 09/29/20   [provider]  fentaNYL (DURAGESIC) 12 MCG/HR Place 1 patch onto the skin every 3 (three) days. 10/23/20   Almon Hercules, MD  gabapentin (NEURONTIN) 300 MG capsule Take 300 mg by mouth 3 (three) times daily. Last filled 09-16-20 for 30days 10/06/20   [provider]  levonorgestrel (MIRENA) 20 MCG/24HR IUD 1 each by Intrauterine route once.    [provider]  sertraline (ZOLOFT) 100 MG tablet Take 100 mg by mouth daily. Last filled 06-18-20 30day supply 06/18/20   [provider]    Allergies    Penicillins, Tylenol [acetaminophen], Venlafaxine, and Morphine and related  Review of Systems   Review of Systems  Unable to perform ROS: Mental status change   Physical Exam Updated Vital Signs BP 107/73   Pulse 70   Temp (!) 97.2 F (36.2 C) (Oral)  Resp 15   Ht 5\' 7"  (1.702 m)   Wt 60 kg   SpO2 99%   BMI 20.72 kg/m   Physical Exam Vitals and nursing note reviewed.  Constitutional:      Appearance: She is not diaphoretic.  HENT:     Head: Atraumatic.     Mouth/Throat:     Mouth: Mucous membranes are moist.  Eyes:     Conjunctiva/sclera: Conjunctivae normal.     Pupils: Pupils are equal, round, and reactive to light.  Cardiovascular:     Rate and Rhythm: Normal rate.  Pulmonary:     Effort: No respiratory distress.     Breath sounds: No stridor.  Abdominal:     General: There is no distension.  Musculoskeletal:     Cervical back: Normal range of motion and neck supple.     Comments: No obvious acute injury  Skin:    General: Skin is warm.  Neurological:      Comments: Patient awakens to loud voice.  She will answer "questions with 1-2 words and then fall back asleep.  She spontaneously moves all 4 extremities.  No facial droop.  Psychiatric:        Mood and Affect: Mood normal.        Behavior: Behavior normal.    ED Results / Procedures / Treatments   Labs (all labs ordered are listed, but only abnormal results are displayed) Labs Reviewed  COMPREHENSIVE METABOLIC PANEL - Abnormal; Notable for the following components:      Result Value   Potassium 3.1 (*)    Glucose, Bld 103 (*)    All other components within normal limits  CBC WITH DIFFERENTIAL/PLATELET - Abnormal; Notable for the following components:   Hemoglobin 11.9 (*)    MCH 25.7 (*)    All other components within normal limits  ACETAMINOPHEN LEVEL - Abnormal; Notable for the following components:   Acetaminophen (Tylenol), Serum <10 (*)    All other components within normal limits  SALICYLATE LEVEL - Abnormal; Notable for the following components:   Salicylate Lvl <7.0 (*)    All other components within normal limits  AMMONIA - Abnormal; Notable for the following components:   Ammonia 39 (*)    All other components within normal limits  RESP PANEL BY RT-PCR (FLU A&B, COVID) ARPGX2  ETHANOL  HCG, SERUM, QUALITATIVE  RAPID URINE DRUG SCREEN, HOSP PERFORMED    EKG None  Radiology No results found.  Procedures Procedures   Medications Ordered in ED Medications - No data to display  ED Course  I have reviewed the triage vital signs and the nursing notes.  Pertinent labs & imaging results that were available during my care of the patient were reviewed by me and considered in my medical decision making (see chart for details).  Clinical Course as of 06/12/21 2351  Tue Jun 12, 2021  2240 Patient is resting comfortably.  She remains on end-tidal.  She is not having apnea spells or episodes.  At this point she has adequate respiratory drive and does not appear to  require Narcan. [EH]    Clinical Course User Index [EH] Jun 14, 2021   MDM Rules/Calculators/A&P                          Patient is a 35 year old woman with past medical history of polysubstance abuse who presents today for evaluation after she was found by PD in a parking  lot unresponsive.  Patient is unable to tell me what she took and unable to answer questions.  She was placed on end-tidal monitoring due to concern for polysubstance abuse and overdose. She was noted to have episodes of about 8 to 10 seconds of apnea however this appears to have resolved with watchful waiting.  She was closely observed during this period with continuous cardiac, pulse oximetry and end-tidal monitoring.   Patient will require additional time to metabolize her intoxicants.  At shift change care was transferred to Dr. Judd Lien who will follow pending studies, re-evaulate and determine disposition.    Final Clinical Impression(s) / ED Diagnoses Final diagnoses:  Polysubstance abuse (HCC)  Medical clearance for incarceration  Drug overdose, undetermined intent, initial encounter    Rx / DC Orders ED Discharge Orders     None        Norman Clay 06/12/21 2351    Geoffery Lyons, MD 06/13/21 [phone]

## 2021-06-12 NOTE — ED Triage Notes (Signed)
Pt brought in by PD found in a parking lot unresponsive, pt says she took a nerve pill and that's all,  pt unable to stay aware in triage.

## 2021-06-12 NOTE — ED Notes (Signed)
One bag of belongings placed on 3rd shelf of locker room

## 2021-06-12 NOTE — Discharge Instructions (Signed)
There is help if you need it.  Please do not use dirty needles, this could cause you a severe infection to your skin, heart or spinal cord.  This could kill you or leave you permanently disabled.  There was a recent study done at [facility] that showed that the risk of death for someone that had unintentionally overdosed on narcotics was as high as 15% in the next year.  This is much higher than most every other medical condition.  [place] [facility] ([facility]) Fixed; mobile; peer-based Chase Holleman [phone] [email or web address] Fixed site exchange at [employer], Wednesdays (2-5pm) and Thursdays (4-8pm). [address] [place], [place] [address] Call or text to arrange mobile and peer exchange, Mondays (1-4pm) and Fridays (4-7pm). Serving [place] [email or web address]  Suboxone clinic: [facility] [address] [place], [address]  [facility] [address] [place], [address]  [facility] [address] [place] [address]  

## 2021-06-13 NOTE — ED Provider Notes (Signed)
  Physical Exam  BP 97/65   Pulse 73   Temp (!) 97.2 F (36.2 C) (Oral)   Resp (!) 29   Ht 5\' 7"  (1.702 m)   Wt 60 kg   SpO2 99%   BMI 20.72 kg/m   Physical Exam Constitutional:      General: She is not in acute distress.    Appearance: Normal appearance. She is not ill-appearing.  HENT:     Head: Normocephalic and atraumatic.  Pulmonary:     Effort: Pulmonary effort is normal.  Neurological:     Mental Status: She is alert and oriented to person, place, and time.    ED Course/Procedures   Clinical Course as of 06/13/21 0326  Tue Jun 12, 2021  2240 Patient is resting comfortably.  She remains on end-tidal.  She is not having apnea spells or episodes.  At this point she has adequate respiratory drive and does not appear to require Narcan. [EH]    Clinical Course User Index [EH] Jun 14, 2021, PA-C    Procedures  MDM   Care assumed from Dr. Cristina Gong and Estell Harpin at shift change.  Patient brought here for decreased responsiveness related to suspected overdose.  Patient was brought here by law enforcement and is currently in their custody.  Patient is now awake, talking, and seems appropriate for discharge.  She has been observed here for nearly 7 hours and has remained somnolent, but stable.       Lyndel Safe, MD 06/13/21 [phone]
